# Patient Record
Sex: Female | Born: 1985 | Race: Black or African American | Hispanic: No | Marital: Single | Smoking: Former smoker
Health system: Southern US, Community
[De-identification: ages and names within clinical notes are randomized; demographics above are authoritative.]

## PROBLEM LIST (undated history)

## (undated) ENCOUNTER — Inpatient Hospital Stay (HOSPITAL_COMMUNITY): Payer: Self-pay

## (undated) DIAGNOSIS — Z789 Other specified health status: Secondary | ICD-10-CM

## (undated) DIAGNOSIS — Z720 Tobacco use: Secondary | ICD-10-CM

## (undated) DIAGNOSIS — R42 Dizziness and giddiness: Secondary | ICD-10-CM

## (undated) DIAGNOSIS — M8700 Idiopathic aseptic necrosis of unspecified bone: Secondary | ICD-10-CM

## (undated) DIAGNOSIS — G43909 Migraine, unspecified, not intractable, without status migrainosus: Secondary | ICD-10-CM

## (undated) HISTORY — DX: Migraine, unspecified, not intractable, without status migrainosus: G43.909

## (undated) HISTORY — DX: Dizziness and giddiness: R42

## (undated) HISTORY — DX: Tobacco use: Z72.0

## (undated) HISTORY — DX: Idiopathic aseptic necrosis of unspecified bone: M87.00

---
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
## 2003-06-05 ENCOUNTER — Emergency Department (HOSPITAL_COMMUNITY): Admission: EM | Admit: 2003-06-05 | Discharge: 2003-06-05 | Payer: Self-pay | Admitting: Emergency Medicine

## 2011-05-12 ENCOUNTER — Encounter (HOSPITAL_COMMUNITY): Payer: Self-pay | Admitting: *Deleted

## 2011-05-12 ENCOUNTER — Inpatient Hospital Stay (HOSPITAL_COMMUNITY)
Admission: AD | Admit: 2011-05-12 | Discharge: 2011-05-12 | Disposition: A | Discharge status: Home / Self Care | Payer: Medicaid Other | Source: Ambulatory Visit | Attending: Family Medicine | Admitting: Family Medicine

## 2011-05-12 DIAGNOSIS — Z3201 Encounter for pregnancy test, result positive: Secondary | ICD-10-CM | POA: Insufficient documentation

## 2011-05-12 HISTORY — DX: Other specified health status: Z78.9

## 2011-05-12 LAB — POCT PREGNANCY, URINE: Preg Test, Ur: POSITIVE — AB

## 2011-05-12 NOTE — MAU Note (Signed)
Hope Neese RNP in triage with patient, and gave proof of pregnancy letter.

## 2011-05-12 NOTE — MAU Note (Signed)
Pt states, " I need a proof of pregnancy letter so I can start care and get medicaid,"

## 2011-05-12 NOTE — MAU Provider Note (Signed)
Kathleen Powell is a 26 y.o. female @ 10.[redacted] weeks gestation by LMP. She is here for pregnancy verification letter so she can get her insurance in place and start prenatal care. She denies any problems at this time. Results for orders placed during the hospital encounter of 05/12/11 (from the past 24 hour(s))  POCT PREGNANCY, URINE     Status: Abnormal   Collection Time   05/12/11  4:10 PM      Component Value Range   Preg Test, Ur POSITIVE (*) NEGATIVE    Assessment: Positive Pregnancy Test  Plan:  Pregnancy verification letter givne   Patient to start prenatal care

## 2011-05-12 NOTE — Discharge Instructions (Signed)
Prenatal Care  WHAT IS PRENATAL CARE?  Prenatal care means health care during your pregnancy, before your baby is born. Take care of yourself and your baby by:   Getting early prenatal care. If you know you are pregnant, or think you might be pregnant, call your caregiver as soon as possible. Schedule a visit for a general/prenatal examination.   Getting regular prenatal care. Follow your caregiver's schedule for blood and other necessary tests. Do not miss appointments.   Do everything you can to keep yourself and your baby healthy during your pregnancy.   Prenatal care should include evaluation of medical, dietary, educational, psychological, and social needs for the couple and the medical, surgical, and genetic history of the family of the mother and father.   Discuss with your caregiver:   Your medicines, prescription, over-the-counter, and herbal medicines.   Substance abuse, alcohol, smoking, and illegal drugs.   Domestic abuse and violence, if present.   Your immunizations.   Nutrition and diet.   Exercising.   Environment and occupational hazards, at home and at work.   History of sexually transmitted disease, both you and your partner.   Previous pregnancies.  WHY IS PRENATAL CARE SO IMPORTANT?  By seeing you regularly, your caregiver has the chance to find problems early, so that they can be treated as soon as possible. Other problems might be prevented. Many studies have shown that early and regular prenatal care is important for the health of both mothers and their babies.  I AM THINKING ABOUT GETTING PREGNANT. HOW CAN I TAKE CARE OF MYSELF?  Taking care of yourself before you get pregnant helps you to have a healthy pregnancy. It also lowers your chances of having a baby born with a birth defect. Here are ways to take care of yourself before you get pregnant:   Eat healthy foods, exercise regularly (30 minutes per day for most days of the week is best), and get enough  rest and sleep. Talk to your caregiver about what kinds of foods and exercises are best for you.   Take 400 micrograms (mcg) of folic acid (one of the B vitamins) every day. The best way to do this is to take a daily multivitamin pill that contains this amount of folic acid. Getting enough of the synthetic (manufactured) form of folic acid every day before you get pregnant and during early pregnancy can help prevent certain birth defects. Many breakfast cereals and other grain products have folic acid added to them, but only certain cereals contain 400 mcg of folic acid per serving. Check the label on your multivitamin or cereal to find the amount of folic acid in the food.   See your caregiver for a complete check up before getting pregnant. Make sure that you have had all your immunization shots, especially for rubella (German measles). Rubella can cause serious birth defects. Chickenpox is another illness you want to avoid during pregnancy. If you have had chickenpox and rubella in the past, you should be immune to them.   Tell your caregiver about any prescription or non-prescription medicines (including herbal remedies) you are taking. Some medicines are not safe to take during pregnancy.   Stop smoking cigarettes, drinking alcohol, or taking illegal drugs. Ask your caregiver for help, if you need it. You can also get help with alcohol and drugs by talking with a member of your faith community, a counselor, or a trusted friend.   Discuss and treat any medical, social, or psychological   problems before getting pregnant.   Discuss any history of genetic problems in the mother, father, and their families. Do genetic testing before getting pregnant, when possible.   Discuss any physical or emotional abuse with your caregiver.   Discuss with your caregiver if you might be exposed to harmful chemicals on your job or where you live.   Discuss with your caregiver if you think your job or the hours you  work may be harmful and should be changed.   The father should be involved with the decision making and with all aspects of the pregnancy, labor, and delivery.   If you have medical insurance, make sure you are covered for pregnancy.  I JUST FOUND OUT THAT I AM PREGNANT. HOW CAN I TAKE CARE OF MYSELF?  Here are ways to take care of yourself and the precious new life growing inside you:   Continue taking your multivitamin with 400 micrograms (mcg) of folic acid every day.   Get early and regular prenatal care. It does not matter if this is your first pregnancy or if you already have children. It is very important to see a caregiver during your pregnancy. Your caregiver will check at each visit to make sure that you and the baby are healthy. If there are any problems, action can be taken right away to help you and the baby.   Eat a healthy diet that includes:   Fruits.   Vegetables.   Foods low in saturated fat.   Grains.   Calcium-rich foods.   Drink 6 to 8 glasses of liquids a day.   Unless your caregiver tells you not to, try to be physically active for 30 minutes, most days of the week. If you are pressed for time, you can get your activity in through 10 minute segments, three times a day.   If you smoke, drink alcohol, or use drugs, STOP. These can cause long-term damage to your baby. Talk with your caregiver about steps to take to stop smoking. Talk with a member of your faith community, a counselor, a trusted friend, or your caregiver if you are concerned about your alcohol or drug use.   Ask your caregiver before taking any medicine, even over-the-counter medicines. Some medicines are not safe to take during pregnancy.   Get plenty of rest and sleep.   Avoid hot tubs and saunas during pregnancy.   Do not have X-rays taken, unless absolutely necessary and with the recommendation of your caregiver. A lead shield can be placed on your abdomen, to protect the baby when X-rays  are taken in other parts of the body.   Do not empty the cat litter when you are pregnant. It may contain a parasite that causes an infection called toxoplasmosis, which can cause birth defects. Also, use gloves when working in garden areas used by cats.   Do not eat uncooked or undercooked cheese, meats, or fish.   Stay away from toxic chemicals like:   Insecticides.   Solvents (some cleaners or paint thinners).   Lead.   Mercury.   Sexual relations may continue until the end of the pregnancy, unless you have a medical problem or there is a problem with the pregnancy and your caregiver tells you not to.   Do not wear high heel shoes, especially during the second half of the pregnancy. You can lose your balance and fall.   Do not take long trips, unless absolutely necessary. Be sure to see your caregiver before   going on the trip.   Do not sit in one position for more than 2 hours, when on a trip.   Take a copy of your medical records when going on a trip.   Know where there is a hospital in the city you are visiting, in case of an emergency.   Most dangerous household products will have pregnancy warnings on their labels. Ask your caregiver about products if you are unsure.   Limit or eliminate your caffeine intake from coffee, tea, sodas, medicines, and chocolate.   Many women continue working through pregnancy. Staying active might help you stay healthier. If you have a question about the safety or the hours you work at your particular job, talk with your caregiver.   Get informed:   Read books.   Watch videos.   Go to childbirth classes for you and the father.   Talk with experienced moms.   Ask your caregiver about childbirth education classes for you and your partner. Classes can help you and your partner prepare for the birth of your baby.   Ask about a pediatrician (baby doctor) and methods and pain medicine for labor, delivery, and possible Cesarean delivery  (C-section).  I AM NOT THINKING ABOUT GETTING PREGNANT RIGHT NOW, BUT HEARD THAT ALL WOMEN SHOULD TAKE FOLIC ACID EVERY DAY?  All women of childbearing age, with even a remote chance of getting pregnant, should try to make sure they get enough folic acid. Many pregnancies are not planned. Many women do not know they are actually pregnant early in their pregnancies, and certain birth defects happen in the very early part of pregnancy. Taking 400 micrograms (mcg) of folic acid every day will help prevent certain birth defects that happen in the early part of pregnancy. If a woman begins taking vitamin pills in the second or third month of pregnancy, it may be too late to prevent birth defects. Folic acid may also have other health benefits for women, besides preventing birth defects.  HOW OFTEN SHOULD I SEE MY CAREGIVER DURING PREGNANCY?  Your caregiver will give you a schedule for your prenatal visits. You will have visits more often as you get closer to the end of your pregnancy. An average pregnancy lasts about 40 weeks.  A typical schedule includes visiting your caregiver:   About once each month, during your first 6 months of pregnancy.   Every 2 weeks, during the next 2 months.   Weekly in the last month, until the delivery date.  Your caregiver will probably want to see you more often if:  You are over 35.   Your pregnancy is high risk, because you have certain health problems or problems with the pregnancy, such as:   Diabetes.   High blood pressure.   The baby is not growing on schedule, according to the dates of the pregnancy.  Your caregiver will do special tests, to make sure you and the baby are not having any serious problems. WHAT HAPPENS DURING PRENATAL VISITS?   At your first prenatal visit, your caregiver will talk to you about you and your partner's health history and your family's health history, and will do a physical exam.   On your first visit, a physical exam will  include checks of your blood pressure, height and weight, and an exam of your pelvic organs. Your caregiver will do a Pap test if you have not had one recently, and will do cultures of your cervix to make sure there is no   infection.   At each visit, there will be tests of your blood, urine, blood pressure, weight, and checking the progress of the baby.   Your caregiver will be able to tell you when to expect that your baby will be born.   Each visit is also a chance for you to learn about staying healthy during pregnancy and for asking questions.   Discuss whether you will be breastfeeding.   At your later prenatal visits, your caregiver will check how you are doing and how the baby is developing. You may have a number of tests done as your pregnancy progresses.   Ultrasound exams are often used to check on the baby's growth and health.   You may have more urine and blood tests, as well as special tests, if needed. These may include amniocentesis (examine fluid in the pregnancy sac), stress tests (check how baby responds to contractions), biophysical profile (measures fetus well-being). Your caregiver will explain the tests and why they are necessary.  I AM IN MY LATE THIRTIES, AND I WANT TO HAVE A CHILD NOW. SHOULD I DO ANYTHING SPECIAL?  As you get older, there is more chance of having a medical problem (high blood pressure), pregnancy problem (preeclampsia, problems with the placenta), miscarriage, or a baby born with a birth defect. However, most women in their late thirties and early forties have healthy babies. See your caregiver on a regular basis before you get pregnant and be sure to go for exams throughout your pregnancy. Your caregiver probably will want to do some special tests to check on you and your baby's health when you are pregnant.  Women today are often delaying having children until later in life, when they are in their thirties and forties. While many women in their thirties  and forties have no difficulty getting pregnant, fertility does decline with age. For women over 40 who cannot get pregnant after 6 months of trying, it is recommended that they see their caregiver for a fertility evaluation. It is not uncommon to have trouble becoming pregnant or experience infertility (inability to become pregnant after trying for one year). If you think that you or your partner may be infertile, you can discuss this with your caregiver. He or she can recommend treatments such as drugs, surgery, or assisted reproductive technology.  Document Released: 02/04/2003 Document Revised: 01/21/2011 Document Reviewed: 01/01/2009 Exit[employer] Patient Information 2012 [employer]. 

## 2011-05-13 NOTE — MAU Provider Note (Signed)
Chart reviewed and agree with management and plan.  

## 2011-06-12 ENCOUNTER — Encounter (HOSPITAL_COMMUNITY): Payer: Self-pay | Admitting: *Deleted

## 2011-06-12 ENCOUNTER — Inpatient Hospital Stay (HOSPITAL_COMMUNITY): Payer: Medicaid Other

## 2011-06-12 ENCOUNTER — Inpatient Hospital Stay (HOSPITAL_COMMUNITY)
Admission: AD | Admit: 2011-06-12 | Discharge: 2011-06-12 | Disposition: A | Discharge status: Home / Self Care | Payer: Medicaid Other | Source: Ambulatory Visit | Attending: Family Medicine | Admitting: Family Medicine

## 2011-06-12 DIAGNOSIS — O4690 Antepartum hemorrhage, unspecified, unspecified trimester: Secondary | ICD-10-CM

## 2011-06-12 DIAGNOSIS — O4692 Antepartum hemorrhage, unspecified, second trimester: Secondary | ICD-10-CM | POA: Diagnosis present

## 2011-06-12 DIAGNOSIS — O3660X0 Maternal care for excessive fetal growth, unspecified trimester, not applicable or unspecified: Secondary | ICD-10-CM

## 2011-06-12 DIAGNOSIS — R319 Hematuria, unspecified: Secondary | ICD-10-CM | POA: Insufficient documentation

## 2011-06-12 DIAGNOSIS — O99891 Other specified diseases and conditions complicating pregnancy: Secondary | ICD-10-CM | POA: Insufficient documentation

## 2011-06-12 DIAGNOSIS — N760 Acute vaginitis: Secondary | ICD-10-CM

## 2011-06-12 LAB — URINALYSIS, ROUTINE W REFLEX MICROSCOPIC
Glucose, UA: NEGATIVE mg/dL
Ketones, ur: NEGATIVE mg/dL
Leukocytes, UA: NEGATIVE
Protein, ur: NEGATIVE mg/dL

## 2011-06-12 LAB — WET PREP, GENITAL
Trich, Wet Prep: NONE SEEN
Yeast Wet Prep HPF POC: NONE SEEN

## 2011-06-12 LAB — URINE MICROSCOPIC-ADD ON

## 2011-06-12 MED ORDER — METRONIDAZOLE 500 MG PO TABS: 500.0000 mg | ORAL_TABLET | Freq: Two times a day (BID) | ORAL | Status: AC

## 2011-06-12 NOTE — Progress Notes (Signed)
Kathleen Powell CNM in with pt

## 2011-06-12 NOTE — MAU Note (Signed)
Went to [facility] at work about 1545 and noticed few drops blood in urine.  Did not see any vaginal bleeding when wiped and is pretty sure blood was in urine. Denies any pain

## 2011-06-12 NOTE — MAU Provider Note (Signed)
  History     CSN: [id number]  Arrival date and time: 06/12/11 4696   First Provider Initiated Contact with Patient 06/12/11 1946      Chief Complaint  Patient presents with  . Hematuria   HPI Kathleen Powell is 26 y.o. G1P0 Unknown weeks presenting with report of seeing "a couple of drops of blood when I went to the bathroom (urinating)".  Denies dysuria. Urinating small amount.  Last intercourse 3 weeks ago.  Denies abdominal pain.  No concern for STIs.  Has appointment with OB 5/1 with MD on [facility].  Fetal heart tones by doppler 160.      Past Medical History  Diagnosis Date  . No pertinent past medical history     Past Surgical History  Procedure Date  . No past surgeries     Family History  Problem Relation Age of Onset  . Anesthesia problems Neg Hx   . Heart disease Maternal Grandmother     History  Substance Use Topics  . Smoking status: Current Everyday Smoker -- 0.2 packs/day  . Smokeless tobacco: Never Used  . Alcohol Use: No    Allergies: No Known Allergies  Prescriptions prior to admission  Medication Sig Dispense Refill  . acetaminophen (TYLENOL) 325 MG tablet Take 650 mg by mouth every 6 (six) hours as needed. For pain      . Prenatal Vit-Fe Fumarate-FA (PRENATAL MULTIVITAMIN) TABS Take 1 tablet by mouth daily.        ROS Physical Exam   Blood pressure 129/61, pulse 115, temperature 98.7 F (37.1 C), temperature source Oral, resp. rate 20, height 5\' 4"  (1.626 m), weight 51.71 kg (114 lb), last menstrual period 02/27/2011.  Physical Exam  MAU Course  Procedures  MDM  20:00 Care turned over to Ivonne Andrew, CNM Assessment and Plan    Kendrah Lovern,EVE M 06/12/2011, 7:46 PM

## 2011-06-12 NOTE — Progress Notes (Signed)
Written and verbal d/c instructions given and understanding voiced. 

## 2011-06-12 NOTE — Discharge Instructions (Signed)
Bacterial Vaginosis Bacterial vaginosis (BV) is a vaginal infection where the normal balance of bacteria in the vagina is disrupted. The normal balance is then replaced by an overgrowth of certain bacteria. There are several different kinds of bacteria that can cause BV. BV is the most common vaginal infection in women of childbearing age. CAUSES   The cause of BV is not fully understood. BV develops when there is an increase or imbalance of harmful bacteria.   Some activities or behaviors can upset the normal balance of bacteria in the vagina and put women at increased risk including:   Having a new sex partner or multiple sex partners.   Douching.   Using an intrauterine device (IUD) for contraception.   It is not clear what role sexual activity plays in the development of BV. However, women that have never had sexual intercourse are rarely infected with BV.  Women do not get BV from toilet seats, bedding, swimming pools or from touching objects around them.  SYMPTOMS   Grey vaginal discharge.   A fish-like odor with discharge, especially after sexual intercourse.   Itching or burning of the vagina and vulva.   Burning or pain with urination.   Some women have no signs or symptoms at all.  DIAGNOSIS  Your caregiver must examine the vagina for signs of BV. Your caregiver will perform lab tests and look at the sample of vaginal fluid through a microscope. They will look for bacteria and abnormal cells (clue cells), a pH test higher than 4.5, and a positive amine test all associated with BV.  RISKS AND COMPLICATIONS   Pelvic inflammatory disease (PID).   Infections following gynecology surgery.   Developing HIV.   Developing herpes virus.  TREATMENT  Sometimes BV will clear up without treatment. However, all women with symptoms of BV should be treated to avoid complications, especially if gynecology surgery is planned. Female partners generally do not need to be treated. However,  BV may spread between female sex partners so treatment is helpful in preventing a recurrence of BV.   BV may be treated with antibiotics. The antibiotics come in either pill or vaginal cream forms. Either can be used with nonpregnant or pregnant women, but the recommended dosages differ. These antibiotics are not harmful to the baby.   BV can recur after treatment. If this happens, a second round of antibiotics will often be prescribed.   Treatment is important for pregnant women. If not treated, BV can cause a premature delivery, especially for a pregnant woman who had a premature birth in the past. All pregnant women who have symptoms of BV should be checked and treated.   For chronic reoccurrence of BV, treatment with a type of prescribed gel vaginally twice a week is helpful.  HOME CARE INSTRUCTIONS   Finish all medication as directed by your caregiver.   Do not have sex until treatment is completed.   Tell your sexual partner that you have a vaginal infection. They should see their caregiver and be treated if they have problems, such as a mild rash or itching.   Practice safe sex. Use condoms. Only have 1 sex partner.  PREVENTION  Basic prevention steps can help reduce the risk of upsetting the natural balance of bacteria in the vagina and developing BV:  Do not have sexual intercourse (be abstinent).   Do not douche.   Use all of the medicine prescribed for treatment of BV, even if the signs and symptoms go away.     Tell your sex partner if you have BV. That way, they can be treated, if needed, to prevent reoccurrence.  SEEK MEDICAL CARE IF:   Your symptoms are not improving after 3 days of treatment.   You have increased discharge, pain, or fever.  MAKE SURE YOU:   Understand these instructions.   Will watch your condition.   Will get help right away if you are not doing well or get worse.  FOR MORE INFORMATION  Division of STD Prevention (DSTDP), Centers for Disease  Control and Prevention: www.cdc.gov/std American Social Health Association (ASHA): www.ashastd.org  Document Released: 02/01/2005 Document Revised: 01/21/2011 Document Reviewed: 07/25/2008 ExitCare Patient Information 2012 [employer]. 

## 2011-06-13 NOTE — MAU Provider Note (Signed)
Chart reviewed and agree with management and plan.  

## 2011-06-14 LAB — GC/CHLAMYDIA PROBE AMP, GENITAL: GC Probe Amp, Genital: NEGATIVE

## 2011-06-16 LAB — OB RESULTS CONSOLE HEPATITIS B SURFACE ANTIGEN: Hepatitis B Surface Ag: NEGATIVE

## 2011-06-16 LAB — OB RESULTS CONSOLE HIV ANTIBODY (ROUTINE TESTING): HIV: NONREACTIVE

## 2011-11-10 ENCOUNTER — Telehealth (HOSPITAL_COMMUNITY): Payer: Self-pay | Admitting: *Deleted

## 2011-11-10 ENCOUNTER — Encounter (HOSPITAL_COMMUNITY): Payer: Self-pay | Admitting: *Deleted

## 2011-11-10 NOTE — Telephone Encounter (Signed)
Preadmission screen  

## 2011-11-25 ENCOUNTER — Other Ambulatory Visit: Payer: Self-pay | Admitting: Obstetrics & Gynecology

## 2011-11-25 ENCOUNTER — Telehealth (HOSPITAL_COMMUNITY): Payer: Self-pay | Admitting: *Deleted

## 2011-11-25 DIAGNOSIS — O480 Post-term pregnancy: Secondary | ICD-10-CM

## 2011-11-25 NOTE — Telephone Encounter (Signed)
Preadmission screen  

## 2011-11-26 ENCOUNTER — Ambulatory Visit (HOSPITAL_COMMUNITY)
Admission: RE | Admit: 2011-11-26 | Discharge: 2011-11-26 | Disposition: A | Discharge status: Home / Self Care | Payer: Medicaid Other | Source: Ambulatory Visit | Attending: Obstetrics & Gynecology | Admitting: Obstetrics & Gynecology

## 2011-11-26 DIAGNOSIS — O480 Post-term pregnancy: Secondary | ICD-10-CM | POA: Insufficient documentation

## 2011-11-26 DIAGNOSIS — Z3689 Encounter for other specified antenatal screening: Secondary | ICD-10-CM | POA: Insufficient documentation

## 2011-11-27 ENCOUNTER — Inpatient Hospital Stay (HOSPITAL_COMMUNITY): Payer: Medicaid Other

## 2011-11-27 ENCOUNTER — Inpatient Hospital Stay (HOSPITAL_COMMUNITY)
Admission: AD | Admit: 2011-11-27 | Discharge: 2011-11-29 | DRG: 766 | Disposition: A | Discharge status: Home / Self Care | Payer: Medicaid Other | Source: Ambulatory Visit | Attending: Obstetrics & Gynecology | Admitting: Obstetrics & Gynecology

## 2011-11-27 ENCOUNTER — Encounter (HOSPITAL_COMMUNITY): Payer: Self-pay | Admitting: Anesthesiology

## 2011-11-27 ENCOUNTER — Inpatient Hospital Stay (HOSPITAL_COMMUNITY): Payer: Medicaid Other | Admitting: Anesthesiology

## 2011-11-27 ENCOUNTER — Encounter (HOSPITAL_COMMUNITY): Payer: Self-pay | Admitting: *Deleted

## 2011-11-27 ENCOUNTER — Encounter (HOSPITAL_COMMUNITY)
Admission: AD | Disposition: A | Discharge status: Home / Self Care | Payer: Self-pay | Source: Ambulatory Visit | Attending: Obstetrics & Gynecology

## 2011-11-27 DIAGNOSIS — Z98891 History of uterine scar from previous surgery: Secondary | ICD-10-CM

## 2011-11-27 LAB — CBC
HCT: 34.2 % — ABNORMAL LOW (ref 36.0–46.0)
MCV: 91.7 fL (ref 78.0–100.0)
Platelets: 345 10*3/uL (ref 150–400)
RBC: 3.73 MIL/uL — ABNORMAL LOW (ref 3.87–5.11)
RDW: 12.9 % (ref 11.5–15.5)
WBC: 11.2 10*3/uL — ABNORMAL HIGH (ref 4.0–10.5)

## 2011-11-27 SURGERY — Surgical Case
Anesthesia: Epidural | Site: Abdomen | Wound class: Clean Contaminated

## 2011-11-27 MED ORDER — EPHEDRINE 5 MG/ML INJ
10.0000 mg | INTRAVENOUS | Status: DC | PRN
  Filled 2011-11-27: qty 4

## 2011-11-27 MED ORDER — PHENYLEPHRINE 40 MCG/ML (10ML) SYRINGE FOR IV PUSH (FOR BLOOD PRESSURE SUPPORT): 80.0000 ug | PREFILLED_SYRINGE | INTRAVENOUS | Status: DC | PRN

## 2011-11-27 MED ORDER — KETOROLAC TROMETHAMINE 60 MG/2ML IM SOLN
INTRAMUSCULAR | Status: AC
  Administered 2011-11-27: 60 mg via INTRAMUSCULAR
  Filled 2011-11-27: qty 2

## 2011-11-27 MED ORDER — TETANUS-DIPHTH-ACELL PERTUSSIS 5-2.5-18.5 LF-MCG/0.5 IM SUSP
0.5000 mL | Freq: Once | INTRAMUSCULAR | Status: AC
  Administered 2011-11-29: 0.5 mL via INTRAMUSCULAR
  Filled 2011-11-27: qty 0.5

## 2011-11-27 MED ORDER — ONDANSETRON HCL 4 MG/2ML IJ SOLN: 4.0000 mg | INTRAMUSCULAR | Status: DC | PRN

## 2011-11-27 MED ORDER — DIPHENHYDRAMINE HCL 50 MG/ML IJ SOLN: 25.0000 mg | INTRAMUSCULAR | Status: DC | PRN

## 2011-11-27 MED ORDER — ONDANSETRON HCL 4 MG/2ML IJ SOLN
INTRAMUSCULAR | Status: DC | PRN
  Administered 2011-11-27: 4 mg via INTRAVENOUS

## 2011-11-27 MED ORDER — OXYTOCIN 40 UNITS IN LACTATED RINGERS INFUSION - SIMPLE MED: 62.5000 mL/h | INTRAVENOUS | Status: AC

## 2011-11-27 MED ORDER — ZOLPIDEM TARTRATE 5 MG PO TABS: 5.0000 mg | ORAL_TABLET | Freq: Every evening | ORAL | Status: DC | PRN

## 2011-11-27 MED ORDER — DIPHENHYDRAMINE HCL 50 MG/ML IJ SOLN: 12.5000 mg | INTRAMUSCULAR | Status: DC | PRN

## 2011-11-27 MED ORDER — SIMETHICONE 80 MG PO CHEW: 80.0000 mg | CHEWABLE_TABLET | ORAL | Status: DC | PRN

## 2011-11-27 MED ORDER — SODIUM CHLORIDE 0.9 % IJ SOLN: 3.0000 mL | INTRAMUSCULAR | Status: DC | PRN

## 2011-11-27 MED ORDER — OXYTOCIN 10 UNIT/ML IJ SOLN
40.0000 [IU] | INTRAVENOUS | Status: DC | PRN
  Administered 2011-11-27: 40 [IU] via INTRAVENOUS

## 2011-11-27 MED ORDER — SODIUM BICARBONATE 8.4 % IV SOLN
INTRAVENOUS | Status: AC
  Filled 2011-11-27: qty 50

## 2011-11-27 MED ORDER — SODIUM BICARBONATE 8.4 % IV SOLN
INTRAVENOUS | Status: DC | PRN
  Administered 2011-11-27: 5 mL via EPIDURAL

## 2011-11-27 MED ORDER — OXYTOCIN BOLUS FROM INFUSION
500.0000 mL | Freq: Once | INTRAVENOUS | Status: DC
  Filled 2011-11-27: qty 500

## 2011-11-27 MED ORDER — PRENATAL MULTIVITAMIN CH
1.0000 | ORAL_TABLET | Freq: Every day | ORAL | Status: DC
  Administered 2011-11-28 – 2011-11-29 (×2): 1 via ORAL
  Filled 2011-11-27 (×2): qty 1

## 2011-11-27 MED ORDER — WITCH HAZEL-GLYCERIN EX PADS: 1.0000 "application " | MEDICATED_PAD | CUTANEOUS | Status: DC | PRN

## 2011-11-27 MED ORDER — ONDANSETRON HCL 4 MG/2ML IJ SOLN
INTRAMUSCULAR | Status: AC
  Filled 2011-11-27: qty 2

## 2011-11-27 MED ORDER — ONDANSETRON HCL 4 MG PO TABS: 4.0000 mg | ORAL_TABLET | ORAL | Status: DC | PRN

## 2011-11-27 MED ORDER — IBUPROFEN 600 MG PO TABS: 600.0000 mg | ORAL_TABLET | Freq: Four times a day (QID) | ORAL | Status: DC | PRN

## 2011-11-27 MED ORDER — LACTATED RINGERS IV SOLN: 500.0000 mL | Freq: Once | INTRAVENOUS | Status: DC

## 2011-11-27 MED ORDER — LACTATED RINGERS IV SOLN
INTRAVENOUS | Status: DC
  Administered 2011-11-27: 21:00:00 via INTRAVENOUS

## 2011-11-27 MED ORDER — SODIUM CHLORIDE 0.9 % IR SOLN
Status: DC | PRN
  Administered 2011-11-27: 1000 mL

## 2011-11-27 MED ORDER — LIDOCAINE-EPINEPHRINE (PF) 2 %-1:200000 IJ SOLN
INTRAMUSCULAR | Status: AC
  Filled 2011-11-27: qty 20

## 2011-11-27 MED ORDER — SCOPOLAMINE 1 MG/3DAYS TD PT72
1.0000 | MEDICATED_PATCH | Freq: Once | TRANSDERMAL | Status: DC
  Administered 2011-11-27: 1.5 mg via TRANSDERMAL

## 2011-11-27 MED ORDER — LANOLIN HYDROUS EX OINT: 1.0000 "application " | TOPICAL_OINTMENT | CUTANEOUS | Status: DC | PRN

## 2011-11-27 MED ORDER — LACTATED RINGERS IV SOLN: INTRAVENOUS | Status: DC

## 2011-11-27 MED ORDER — OXYTOCIN 10 UNIT/ML IJ SOLN
INTRAMUSCULAR | Status: AC
  Filled 2011-11-27: qty 4

## 2011-11-27 MED ORDER — MORPHINE SULFATE (PF) 0.5 MG/ML IJ SOLN
INTRAMUSCULAR | Status: DC | PRN
  Administered 2011-11-27: 1 mg via INTRAVENOUS

## 2011-11-27 MED ORDER — BUTORPHANOL TARTRATE 1 MG/ML IJ SOLN: 1.0000 mg | INTRAMUSCULAR | Status: DC | PRN

## 2011-11-27 MED ORDER — LIDOCAINE HCL (PF) 1 % IJ SOLN
INTRAMUSCULAR | Status: DC | PRN
  Administered 2011-11-27 (×3): 4 mL

## 2011-11-27 MED ORDER — TERBUTALINE SULFATE 1 MG/ML IJ SOLN
0.2500 mg | Freq: Once | INTRAMUSCULAR | Status: AC | PRN
  Administered 2011-11-27: 0.25 mg via SUBCUTANEOUS
  Filled 2011-11-27: qty 1

## 2011-11-27 MED ORDER — EPHEDRINE 5 MG/ML INJ: 10.0000 mg | INTRAVENOUS | Status: DC | PRN

## 2011-11-27 MED ORDER — MORPHINE SULFATE 0.5 MG/ML IJ SOLN
INTRAMUSCULAR | Status: AC
  Filled 2011-11-27: qty 10

## 2011-11-27 MED ORDER — DIBUCAINE 1 % RE OINT: 1.0000 "application " | TOPICAL_OINTMENT | RECTAL | Status: DC | PRN

## 2011-11-27 MED ORDER — LACTATED RINGERS IV SOLN
500.0000 mL | INTRAVENOUS | Status: DC | PRN
  Administered 2011-11-27: 1000 mL via INTRAVENOUS

## 2011-11-27 MED ORDER — MEPERIDINE HCL 25 MG/ML IJ SOLN: 6.2500 mg | INTRAMUSCULAR | Status: DC | PRN

## 2011-11-27 MED ORDER — CITRIC ACID-SODIUM CITRATE 334-500 MG/5ML PO SOLN
30.0000 mL | ORAL | Status: DC | PRN
  Administered 2011-11-27: 30 mL via ORAL
  Filled 2011-11-27: qty 15

## 2011-11-27 MED ORDER — PHENYLEPHRINE 40 MCG/ML (10ML) SYRINGE FOR IV PUSH (FOR BLOOD PRESSURE SUPPORT)
80.0000 ug | PREFILLED_SYRINGE | INTRAVENOUS | Status: DC | PRN
  Filled 2011-11-27: qty 5

## 2011-11-27 MED ORDER — KETOROLAC TROMETHAMINE 30 MG/ML IJ SOLN: 30.0000 mg | Freq: Four times a day (QID) | INTRAMUSCULAR | Status: AC | PRN

## 2011-11-27 MED ORDER — ACETAMINOPHEN 325 MG PO TABS: 650.0000 mg | ORAL_TABLET | ORAL | Status: DC | PRN

## 2011-11-27 MED ORDER — OXYTOCIN 40 UNITS IN LACTATED RINGERS INFUSION - SIMPLE MED: 62.5000 mL/h | Freq: Once | INTRAVENOUS | Status: DC

## 2011-11-27 MED ORDER — FLEET ENEMA 7-19 GM/118ML RE ENEM: 1.0000 | ENEMA | RECTAL | Status: DC | PRN

## 2011-11-27 MED ORDER — OXYCODONE-ACETAMINOPHEN 5-325 MG PO TABS: 1.0000 | ORAL_TABLET | ORAL | Status: DC | PRN

## 2011-11-27 MED ORDER — NALBUPHINE HCL 10 MG/ML IJ SOLN
5.0000 mg | INTRAMUSCULAR | Status: DC | PRN
  Filled 2011-11-27: qty 1

## 2011-11-27 MED ORDER — CEFAZOLIN SODIUM-DEXTROSE 2-3 GM-% IV SOLR
2.0000 g | Freq: Once | INTRAVENOUS | Status: AC
  Administered 2011-11-27: 2 g via INTRAVENOUS
  Filled 2011-11-27: qty 50

## 2011-11-27 MED ORDER — SODIUM CHLORIDE 0.9 % IV SOLN: 1.0000 ug/kg/h | INTRAVENOUS | Status: DC | PRN

## 2011-11-27 MED ORDER — MISOPROSTOL 25 MCG QUARTER TABLET: 25.0000 ug | ORAL_TABLET | ORAL | Status: DC | PRN

## 2011-11-27 MED ORDER — ONDANSETRON HCL 4 MG/2ML IJ SOLN: 4.0000 mg | Freq: Three times a day (TID) | INTRAMUSCULAR | Status: DC | PRN

## 2011-11-27 MED ORDER — KETOROLAC TROMETHAMINE 60 MG/2ML IM SOLN
60.0000 mg | Freq: Once | INTRAMUSCULAR | Status: AC | PRN
  Administered 2011-11-27: 60 mg via INTRAMUSCULAR

## 2011-11-27 MED ORDER — IBUPROFEN 600 MG PO TABS
600.0000 mg | ORAL_TABLET | Freq: Four times a day (QID) | ORAL | Status: DC | PRN
  Filled 2011-11-27 (×5): qty 1

## 2011-11-27 MED ORDER — METOCLOPRAMIDE HCL 5 MG/ML IJ SOLN: 10.0000 mg | Freq: Three times a day (TID) | INTRAMUSCULAR | Status: DC | PRN

## 2011-11-27 MED ORDER — DIPHENHYDRAMINE HCL 25 MG PO CAPS: 25.0000 mg | ORAL_CAPSULE | Freq: Four times a day (QID) | ORAL | Status: DC | PRN

## 2011-11-27 MED ORDER — DIPHENHYDRAMINE HCL 25 MG PO CAPS: 25.0000 mg | ORAL_CAPSULE | ORAL | Status: DC | PRN

## 2011-11-27 MED ORDER — ONDANSETRON HCL 4 MG/2ML IJ SOLN: 4.0000 mg | Freq: Four times a day (QID) | INTRAMUSCULAR | Status: DC | PRN

## 2011-11-27 MED ORDER — LACTATED RINGERS IV SOLN
INTRAVENOUS | Status: DC
  Administered 2011-11-27: 12:00:00 via INTRAVENOUS

## 2011-11-27 MED ORDER — MENTHOL 3 MG MT LOZG: 1.0000 | LOZENGE | OROMUCOSAL | Status: DC | PRN

## 2011-11-27 MED ORDER — NALOXONE HCL 0.4 MG/ML IJ SOLN: 0.4000 mg | INTRAMUSCULAR | Status: DC | PRN

## 2011-11-27 MED ORDER — SIMETHICONE 80 MG PO CHEW
80.0000 mg | CHEWABLE_TABLET | Freq: Three times a day (TID) | ORAL | Status: DC
  Administered 2011-11-28 – 2011-11-29 (×2): 80 mg via ORAL

## 2011-11-27 MED ORDER — SENNOSIDES-DOCUSATE SODIUM 8.6-50 MG PO TABS
2.0000 | ORAL_TABLET | Freq: Every day | ORAL | Status: DC
  Administered 2011-11-28: 2 via ORAL

## 2011-11-27 MED ORDER — OXYCODONE-ACETAMINOPHEN 5-325 MG PO TABS
1.0000 | ORAL_TABLET | ORAL | Status: DC | PRN
  Administered 2011-11-28 – 2011-11-29 (×7): 1 via ORAL
  Filled 2011-11-27 (×7): qty 1

## 2011-11-27 MED ORDER — IBUPROFEN 600 MG PO TABS
600.0000 mg | ORAL_TABLET | Freq: Four times a day (QID) | ORAL | Status: DC
  Administered 2011-11-28 – 2011-11-29 (×6): 600 mg via ORAL
  Filled 2011-11-27: qty 1

## 2011-11-27 MED ORDER — SCOPOLAMINE 1 MG/3DAYS TD PT72
MEDICATED_PATCH | TRANSDERMAL | Status: AC
  Filled 2011-11-27: qty 1

## 2011-11-27 MED ORDER — LIDOCAINE HCL (PF) 1 % IJ SOLN: 30.0000 mL | INTRAMUSCULAR | Status: DC | PRN

## 2011-11-27 MED ORDER — FENTANYL CITRATE 0.05 MG/ML IJ SOLN: 25.0000 ug | INTRAMUSCULAR | Status: DC | PRN

## 2011-11-27 MED ORDER — LACTATED RINGERS IV SOLN
INTRAVENOUS | Status: DC | PRN
  Administered 2011-11-27 (×2): via INTRAVENOUS

## 2011-11-27 MED ORDER — FENTANYL 2.5 MCG/ML BUPIVACAINE 1/10 % EPIDURAL INFUSION (WH - ANES)
14.0000 mL/h | INTRAMUSCULAR | Status: DC
  Administered 2011-11-27: 14 mL/h via EPIDURAL
  Filled 2011-11-27: qty 125

## 2011-11-27 MED ORDER — MORPHINE SULFATE (PF) 0.5 MG/ML IJ SOLN
INTRAMUSCULAR | Status: DC | PRN
  Administered 2011-11-27: 4 mg via EPIDURAL

## 2011-11-27 SURGICAL SUPPLY — 33 items
CLOTH BEACON ORANGE TIMEOUT ST (SAFETY) ×2 IMPLANT
DERMABOND ADVANCED (GAUZE/BANDAGES/DRESSINGS)
DERMABOND ADVANCED .7 DNX12 (GAUZE/BANDAGES/DRESSINGS) IMPLANT
DRAPE SURG 17X23 STRL (DRAPES) ×2 IMPLANT
DRSG COVADERM 4X10 (GAUZE/BANDAGES/DRESSINGS) ×2 IMPLANT
DURAPREP 26ML APPLICATOR (WOUND CARE) ×2 IMPLANT
ELECT REM PT RETURN 9FT ADLT (ELECTROSURGICAL) ×2
ELECTRODE REM PT RTRN 9FT ADLT (ELECTROSURGICAL) ×1 IMPLANT
EXTRACTOR VACUUM M CUP 4 TUBE (SUCTIONS) IMPLANT
GLOVE BIO SURGEON STRL SZ8.5 (GLOVE) ×4 IMPLANT
GOWN PREVENTION PLUS LG XLONG (DISPOSABLE) ×2 IMPLANT
GOWN PREVENTION PLUS XXLARGE (GOWN DISPOSABLE) ×2 IMPLANT
KIT ABG SYR 3ML LUER SLIP (SYRINGE) IMPLANT
NEEDLE HYPO 25X5/8 SAFETYGLIDE (NEEDLE) IMPLANT
NS IRRIG 1000ML POUR BTL (IV SOLUTION) ×2 IMPLANT
PACK C SECTION WH (CUSTOM PROCEDURE TRAY) ×2 IMPLANT
PAD ABD 7.5X8 STRL (GAUZE/BANDAGES/DRESSINGS) ×2 IMPLANT
PAD OB MATERNITY 4.3X12.25 (PERSONAL CARE ITEMS) ×2 IMPLANT
SLEEVE SCD COMPRESS KNEE MED (MISCELLANEOUS) ×2 IMPLANT
STAPLER VISISTAT 35W (STAPLE) ×2 IMPLANT
SUT CHROMIC 0 CT 802H (SUTURE) ×2 IMPLANT
SUT CHROMIC 1 CTX 36 (SUTURE) ×4 IMPLANT
SUT CHROMIC 2 0 SH (SUTURE) ×2 IMPLANT
SUT GUT PLAIN 0 CT-3 TAN 27 (SUTURE) IMPLANT
SUT MON AB 4-0 PS1 27 (SUTURE) ×2 IMPLANT
SUT VIC AB 0 CT1 18XCR BRD8 (SUTURE) IMPLANT
SUT VIC AB 0 CT1 8-18 (SUTURE)
SUT VIC AB 0 CTX 36 (SUTURE) ×2
SUT VIC AB 0 CTX36XBRD ANBCTRL (SUTURE) ×2 IMPLANT
TAPE CLOTH SURG 4X10 WHT LF (GAUZE/BANDAGES/DRESSINGS) ×2 IMPLANT
TOWEL OR 17X24 6PK STRL BLUE (TOWEL DISPOSABLE) ×4 IMPLANT
TRAY FOLEY CATH 14FR (SET/KITS/TRAYS/PACK) IMPLANT
WATER STERILE IRR 1000ML POUR (IV SOLUTION) ×2 IMPLANT

## 2011-11-27 NOTE — Anesthesia Postprocedure Evaluation (Signed)
  Anesthesia Post-op Note  Patient: Kathleen Powell  Procedure(s) Performed: Procedure(s) (LRB) with comments: CESAREAN SECTION (N/A) - Primary cesarean section with delivery of baby boy at 31. Apgars 8/9.  Patient Location: PACU and Mother/Baby  Anesthesia Type: Epidural  Level of Consciousness: awake, alert  and oriented  Airway and Oxygen Therapy: Patient Spontanous Breathing  Post-op Pain: mild  Post-op Assessment: Post-op Vital signs reviewed  Post-op Vital Signs: Reviewed and stable  Complications: No apparent anesthesia complications

## 2011-11-27 NOTE — Anesthesia Postprocedure Evaluation (Signed)
Anesthesia Post Note  Patient: Kathleen Powell  Procedure(s) Performed: Procedure(s) (LRB): CESAREAN SECTION (N/A)  Anesthesia type: Epidural  Patient location: PACU  Post pain: Pain level controlled  Post assessment: Post-op Vital signs reviewed  Last Vitals:  Filed Vitals:   11/27/11 1745  BP: 128/79  Pulse: 85  Temp: 37.2 C  Resp: 20    Post vital signs: stable  Level of consciousness: awake  Complications: No apparent anesthesia complications

## 2011-11-27 NOTE — Progress Notes (Signed)
Dr Gaynell Face aware of pt's admission and status. Aware of ctx pattern, sve, hx colpo, FM strip. Will obs and reck in an hr. BPP if strip not reactive.

## 2011-11-27 NOTE — Progress Notes (Signed)
Pt turned R lateral. Trans and toco adj

## 2011-11-27 NOTE — Anesthesia Procedure Notes (Signed)
Epidural Patient location during procedure: OB Start time: 11/27/2011 10:27 AM  Staffing Performed by: anesthesiologist   Preanesthetic Checklist Completed: patient identified, site marked, surgical consent, pre-op evaluation, timeout performed, IV checked, risks and benefits discussed and monitors and equipment checked  Epidural Patient position: sitting Prep: site prepped and draped and DuraPrep Patient monitoring: continuous pulse ox and blood pressure Approach: midline Injection technique: LOR air  Needle:  Needle type: Tuohy  Needle gauge: 17 G Needle length: 9 cm and 9 Needle insertion depth: 5 cm cm Catheter type: closed end flexible Catheter size: 19 Gauge Catheter at skin depth: 10 cm Test dose: negative  Assessment Events: blood not aspirated, injection not painful, no injection resistance, negative IV test and no paresthesia  Additional Notes Discussed risk of headache, infection, bleeding, nerve injury and failed or incomplete block.  Patient voices understanding and wishes to proceed. Reason for block:procedure for pain

## 2011-11-27 NOTE — Progress Notes (Signed)
Up to BR and then to u/s via w/c

## 2011-11-27 NOTE — OR Nursing (Addendum)
Uterus massaged by S. Suprena Travaglini Charity fundraiser . Two tubes of cord blood sent to lab. Foley catheter in place upon arrival to OR. Urine color-concentrated.  20cc of blood evacuated from uterus during uterine massage.

## 2011-11-27 NOTE — H&P (Signed)
This is Dr. Francoise Ceo dictating the history and physical on Kathleen Powell she's a 26 year old gravida 1 EDC 11/23/2011 she's for 2 weeks and 4 days negative GBS in labor cervix 1 cm 90% vertex -1-2 amniotomy performed the fluid was meconium-stained she has a history of having had cryosurgery in the past Past medical history negative Past surgical history cryosurgery for abnormal Pap Social history negative Family history negative System review noncontributory Physical exam revealed a well-developed female in labor HEENT negative for Breasts negative Heart regular rhythm no murmurs no gallops Lungs clear to P&A Abdomen term Pelvic as described above Extremities negative

## 2011-11-27 NOTE — Transfer of Care (Signed)
Immediate Anesthesia Transfer of Care Note  Patient: Kathleen Powell  Procedure(s) Performed: Procedure(s) (LRB) with comments: CESAREAN SECTION (N/A) - Primary cesarean section with delivery of baby boy at 1536. Apgars 8/9.  Patient Location: PACU  Anesthesia Type: Epidural  Level of Consciousness: awake, alert  and oriented  Airway & Oxygen Therapy: Patient Spontanous Breathing  Post-op Assessment: Report given to PACU RN and Post -op Vital signs reviewed and stable  Post vital signs: Reviewed and stable  Complications: No apparent anesthesia complications

## 2011-11-27 NOTE — Anesthesia Preprocedure Evaluation (Signed)
Anesthesia Evaluation  Patient identified by MRN, date of birth, ID band Patient awake    Reviewed: Allergy & Precautions, H&P , NPO status , Patient's Chart, lab work & pertinent test results, reviewed documented beta blocker date and time   History of Anesthesia Complications Negative for: history of anesthetic complications  Airway Mallampati: II TM Distance: >3 FB Neck ROM: full    Dental  (+) Teeth Intact   Pulmonary former smoker,  breath sounds clear to auscultation        Cardiovascular negative cardio ROS  Rhythm:regular Rate:Normal     Neuro/Psych negative neurological ROS  negative psych ROS   GI/Hepatic negative GI ROS, Neg liver ROS,   Endo/Other  negative endocrine ROS  Renal/GU negative Renal ROS     Musculoskeletal   Abdominal   Peds  Hematology negative hematology ROS (+)   Anesthesia Other Findings Tongue piercing - asked to remove  Reproductive/Obstetrics (+) Pregnancy                           Anesthesia Physical Anesthesia Plan  ASA: II  Anesthesia Plan: Epidural   Post-op Pain Management:    Induction:   Airway Management Planned:   Additional Equipment:   Intra-op Plan:   Post-operative Plan:   Informed Consent: I have reviewed the patients History and Physical, chart, labs and discussed the procedure including the risks, benefits and alternatives for the proposed anesthesia with the patient or authorized representative who has indicated his/her understanding and acceptance.     Plan Discussed with:   Anesthesia Plan Comments:         Anesthesia Quick Evaluation

## 2011-11-27 NOTE — Addendum Note (Signed)
Addendum  created 11/27/11 1922 by Armanda Heritage, RN   Modules edited:Charges VN, Notes Section

## 2011-11-27 NOTE — MAU Note (Signed)
Been having ctxs since about 0230. No leakng or bleeding.

## 2011-11-27 NOTE — Anesthesia Postprocedure Evaluation (Signed)
  Anesthesia Post-op Note  Patient: Kathleen Powell  Procedure(s) Performed: Procedure(s) (LRB) with comments: CESAREAN SECTION (N/A) - Primary cesarean section with delivery of baby boy at 1536. Apgars 8/9.  Patient Location: PACU and Mother/Baby  Anesthesia Type: Epidural  Level of Consciousness: awake, alert  and oriented  Airway and Oxygen Therapy: Patient Spontanous Breathing  Post-op Pain: mild  Post-op Assessment: Post-op Vital signs reviewed  Post-op Vital Signs: Reviewed and stable  Complications: No apparent anesthesia complications 

## 2011-11-27 NOTE — Progress Notes (Signed)
Patient ID: Kathleen Powell, female   DOB: Feb 15, 1986, 26 y.o.   MRN: [id number] When amniotomy was performed the fluid was noted to be click to be meconium-stained IUPC inserted and she started having late decelerations amnioinfusion was performed oxygen administered in the late decelerations stopped they have no resumed and the persistent and she still 1 cm 90% at a -1 station she delivered by C-section because of nonreassuring fetal heart rate tracing

## 2011-11-27 NOTE — Op Note (Signed)
preop diagnosis meconium-stained fluid nonreassuring fetal heart rate tracing Postop diagnosis the same Surgeon Dr. Francoise Ceo Anesthesia epidural Procedure patient placed on the operating table in the supine position abdomen prepped and draped data entered with a Foley catheter a transverse suprapubic incision made carried down to the rectus fascia fascia cleaned and incised the length of the incision recti muscles retracted laterally peritoneum incised longitudinally a transverse incision made in the visceroperitoneum above the bladder and the bladder mobilized inferiorly transverse low uterine incision made patient delivered from the OP position off a female Apgar 89 the team was in attendance the placenta was fundal removed manually uterine cavity clean with dry laps the uterine incision closed in one layer with continuous within normal on chromic hemostasis was satisfactory bladder flap reattached to a chromic uterus was well contracted tubes and ovaries normal abdomen closed in layers peritoneum continuous with 2-0 chromic fascia continuous with of 0 Dexon and the skin closed with staples blood loss 600 cc patient tolerated the procedure well and

## 2011-11-28 LAB — CBC
MCHC: 33.3 g/dL (ref 30.0–36.0)
MCV: 91.5 fL (ref 78.0–100.0)
Platelets: 268 10*3/uL (ref 150–400)
RDW: 13 % (ref 11.5–15.5)
WBC: 14.5 10*3/uL — ABNORMAL HIGH (ref 4.0–10.5)

## 2011-11-28 NOTE — Progress Notes (Signed)
Patient ID: Kathleen Powell, female   DOB: Apr 20, 1985, 26 y.o.   MRN: [id number] Postop day 1 Vital signs normal Incision clean and dry Fundus firm Lochia moderate No complaints

## 2011-11-28 NOTE — Progress Notes (Signed)
Pt c/o tingling and swelling in Left hand after ambulating and IV catheter removal. Pt able to discern soft and lift touch. Slight swelling L>R noted but no change in skin color and termperature. Pulses intact +2. Pt advised to elevate hand and apply cold pack for 20 minutes. MD notified and told to put heat on it.

## 2011-11-29 ENCOUNTER — Encounter (HOSPITAL_COMMUNITY): Payer: Self-pay | Admitting: Obstetrics

## 2011-11-29 MED ORDER — IBUPROFEN 600 MG PO TABS: 600.0000 mg | ORAL_TABLET | Freq: Four times a day (QID) | ORAL | Status: DC | PRN

## 2011-11-29 MED ORDER — OXYCODONE-ACETAMINOPHEN 5-325 MG PO TABS: 1.0000 | ORAL_TABLET | ORAL | Status: DC | PRN

## 2011-11-29 MED ORDER — FUSION PLUS PO CAPS: 1.0000 | ORAL_CAPSULE | Freq: Every day | ORAL | Status: DC

## 2011-11-29 MED ORDER — PNEUMOCOCCAL VAC POLYVALENT 25 MCG/0.5ML IJ INJ: 0.5000 mL | INJECTION | Freq: Once | INTRAMUSCULAR | Status: DC

## 2011-11-29 MED ORDER — PNEUMOCOCCAL VAC POLYVALENT 25 MCG/0.5ML IJ INJ
0.5000 mL | INJECTION | Freq: Once | INTRAMUSCULAR | Status: AC
  Administered 2011-11-29: 0.5 mL via INTRAMUSCULAR
  Filled 2011-11-29: qty 0.5

## 2011-11-29 NOTE — Progress Notes (Signed)
Post discharge review completed. 

## 2011-11-29 NOTE — Discharge Summary (Signed)
Obstetric Discharge Summary Reason for Admission: onset of labor Prenatal Procedures: ultrasound Intrapartum Procedures: cesarean: low cervical, transverse Postpartum Procedures: none Complications-Operative and Postpartum: none Hemoglobin  Date Value Range Status  11/28/2011 9.0* 12.0 - 15.0 g/dL Final     DELTA CHECK NOTED     REPEATED TO VERIFY     HCT  Date Value Range Status  11/28/2011 27.0* 36.0 - 46.0 % Final    Physical Exam:  General: alert and no distress Lochia: appropriate Uterine Fundus: firm Incision: healing well DVT Evaluation: No evidence of DVT seen on physical exam.  Discharge Diagnoses: Term Pregnancy-delivered  Discharge Information: Date: 11/29/2011 Activity: pelvic rest Diet: routine Medications: PNV, Ibuprofen, Colace, Iron and Percocet Condition: stable Instructions: refer to practice specific booklet Discharge to: home Follow-up Information    Follow up with HARPER,CHARLES A, MD. Schedule an appointment as soon as possible for a visit in 2 weeks.   Contact information:   [address] [place] [address] [phone]          Newborn Data: Live born female  Birth Weight: 6 lb 3.3 oz (2815 g) APGAR: 8, 9  Home with mother.  HARPER,CHARLES A 11/29/2011, 10:21 AM

## 2011-11-29 NOTE — Progress Notes (Signed)
Subjective: Postpartum Day 2: Cesarean Delivery Patient reports no complaints.  Objective: Vital signs in last 24 hours: Temp:  [98 F (36.7 C)-98.9 F (37.2 C)] 98 F (36.7 C) (10/14 0419) Pulse Rate:  [71-84] 71  (10/14 0419) Resp:  [18] 18  (10/14 0419) BP: (100-115)/(56-69) 115/56 mmHg (10/14 0419) SpO2:  [97 %] 97 % (10/13 1425)  Physical Exam:  General: alert and no distress Lochia: appropriate Uterine Fundus: firm Incision: healing well DVT Evaluation: No evidence of DVT seen on physical exam.   Basename 11/28/11 0545 11/27/11 0855  HGB 9.0* 11.3*  HCT 27.0* 34.2*    Assessment/Plan: Status post Cesarean section. Doing well postoperatively.  Discharge home with standard precautions and return to clinic in 2 weeks.  Dion Sibal A 11/29/2011, 10:19 AM

## 2011-11-29 NOTE — Discharge Summary (Deleted)
Obstetric Discharge Summary Reason for Admission: onset of labor Prenatal Procedures: ultrasound Intrapartum Procedures: spontaneous vaginal delivery Postpartum Procedures: none Complications-Operative and Postpartum: none Hemoglobin  Date Value Range Status  11/28/2011 9.0* 12.0 - 15.0 g/dL Final     DELTA CHECK NOTED     REPEATED TO VERIFY     HCT  Date Value Range Status  11/28/2011 27.0* 36.0 - 46.0 % Final    Physical Exam:  General: alert and no distress Lochia: appropriate Uterine Fundus: firm Incision: none DVT Evaluation: No evidence of DVT seen on physical exam.  Discharge Diagnoses: Term Pregnancy-delivered  Discharge Information: Date: 11/29/2011 Activity: pelvic rest Diet: routine Medications: PNV, Ibuprofen, Colace, Iron and Percocet Condition: stable Instructions: refer to practice specific booklet Discharge to: home Follow-up Information    Follow up with HARPER,CHARLES A, MD. Schedule an appointment as soon as possible for a visit in 6 weeks.   Contact information:   [address] [place] [address] [phone]          Newborn Data: Live born female  Birth Weight: 6 lb 3.3 oz (2815 g) APGAR: 8, 9  Home with mother.  HARPER,CHARLES A 11/29/2011, 5:49 AM

## 2011-11-29 NOTE — Progress Notes (Deleted)
Post Partum Day 2 Subjective: no complaints  Objective: Blood pressure 115/56, pulse 71, temperature 98 F (36.7 C), temperature source Oral, resp. rate 18, height 5\' 3"  (1.6 m), weight 67.223 kg (148 lb 3.2 oz), last menstrual period 02/27/2011, SpO2 97.00%, unknown if currently breastfeeding.  Physical Exam:  General: alert and no distress Lochia: appropriate Uterine Fundus: firm Incision: none DVT Evaluation: No evidence of DVT seen on physical exam.   Basename 11/28/11 0545 11/27/11 0855  HGB 9.0* 11.3*  HCT 27.0* 34.2*    Assessment/Plan: Discharge home   LOS: 2 days   Jaydee Ingman A 11/29/2011, 5:42 AM

## 2011-11-30 ENCOUNTER — Inpatient Hospital Stay (HOSPITAL_COMMUNITY): Admission: RE | Admit: 2011-11-30 | Payer: Medicaid Other | Source: Ambulatory Visit

## 2012-06-29 ENCOUNTER — Ambulatory Visit: Payer: Self-pay

## 2012-06-30 ENCOUNTER — Ambulatory Visit (INDEPENDENT_AMBULATORY_CARE_PROVIDER_SITE_OTHER): Payer: BC Managed Care – PPO | Admitting: *Deleted

## 2012-06-30 VITALS — BP 108/73 | HR 93 | Temp 98.7°F | Ht 63.0 in | Wt 122.0 lb

## 2012-06-30 DIAGNOSIS — Z3040 Encounter for surveillance of contraceptives, unspecified: Secondary | ICD-10-CM

## 2012-06-30 MED ORDER — MEDROXYPROGESTERONE ACETATE 150 MG/ML IM SUSP
150.0000 mg | Freq: Once | INTRAMUSCULAR | Status: AC
  Administered 2012-06-30: 150 mg via INTRAMUSCULAR

## 2012-06-30 NOTE — Progress Notes (Signed)
  Subjective:    Kathleen Powell is a 28 y.o. female who presents for Depo- Provera Injection. The patient has no complaints today. The patient is sexually active..  No LMP recorded. Patient has had an injection.    The following portions of the patient's history were reviewed and updated as appropriate: allergies, current medications, past family history, past medical history, past social history, past surgical history and problem list.

## 2012-06-30 NOTE — Patient Instructions (Signed)
  Return September 21, 2012 for next Depo Injection

## 2012-07-03 ENCOUNTER — Ambulatory Visit: Payer: Self-pay

## 2012-07-03 ENCOUNTER — Ambulatory Visit: Payer: Medicaid Other

## 2012-09-14 ENCOUNTER — Ambulatory Visit (INDEPENDENT_AMBULATORY_CARE_PROVIDER_SITE_OTHER): Payer: BC Managed Care – PPO | Admitting: *Deleted

## 2012-09-14 VITALS — BP 129/81 | HR 97 | Temp 98.1°F | Wt 128.0 lb

## 2012-09-14 DIAGNOSIS — Z3040 Encounter for surveillance of contraceptives, unspecified: Secondary | ICD-10-CM

## 2012-09-14 DIAGNOSIS — Z3049 Encounter for surveillance of other contraceptives: Secondary | ICD-10-CM

## 2012-09-14 MED ORDER — MEDROXYPROGESTERONE ACETATE 150 MG/ML IM SUSP
150.0000 mg | Freq: Once | INTRAMUSCULAR | Status: AC
  Administered 2012-09-14: 150 mg via INTRAMUSCULAR

## 2012-09-14 NOTE — Patient Instructions (Addendum)
Please return to office as schedule and as needed. Please call pharmacy for refills and pick-up prescription before next appointment.  

## 2012-09-14 NOTE — Progress Notes (Signed)
Pt tolerated well Depo-Provera 150 mg given in right deltoid.

## 2012-09-15 ENCOUNTER — Ambulatory Visit: Payer: BC Managed Care – PPO

## 2012-12-05 ENCOUNTER — Ambulatory Visit: Payer: BC Managed Care – PPO

## 2012-12-06 ENCOUNTER — Ambulatory Visit: Payer: Self-pay

## 2012-12-07 ENCOUNTER — Ambulatory Visit: Payer: Self-pay | Admitting: Obstetrics & Gynecology

## 2012-12-07 ENCOUNTER — Ambulatory Visit (INDEPENDENT_AMBULATORY_CARE_PROVIDER_SITE_OTHER): Payer: BC Managed Care – PPO | Admitting: *Deleted

## 2012-12-07 VITALS — BP 114/80 | HR 76 | Temp 98.9°F | Ht 63.0 in | Wt 127.0 lb

## 2012-12-07 DIAGNOSIS — Z309 Encounter for contraceptive management, unspecified: Secondary | ICD-10-CM

## 2012-12-07 MED ORDER — MEDROXYPROGESTERONE ACETATE 150 MG/ML IM SUSP: 150.0000 mg | INTRAMUSCULAR | Status: DC

## 2012-12-07 MED ORDER — MEDROXYPROGESTERONE ACETATE 150 MG/ML IM SUSP
150.0000 mg | INTRAMUSCULAR | Status: DC
  Administered 2012-12-07 – 2019-01-01 (×17): 150 mg via INTRAMUSCULAR

## 2012-12-07 NOTE — Progress Notes (Signed)
Patient is in the office today for her Depo Provera injection. Patient reports she is doing well with her shot. Patient has to be at work and will reschedule her annual exam.

## 2013-02-28 ENCOUNTER — Ambulatory Visit: Payer: BC Managed Care – PPO

## 2013-02-28 ENCOUNTER — Ambulatory Visit (INDEPENDENT_AMBULATORY_CARE_PROVIDER_SITE_OTHER): Payer: BC Managed Care – PPO | Admitting: *Deleted

## 2013-02-28 VITALS — BP 119/78 | HR 87 | Temp 98.1°F | Ht 64.0 in | Wt 128.0 lb

## 2013-02-28 DIAGNOSIS — IMO0001 Reserved for inherently not codable concepts without codable children: Secondary | ICD-10-CM

## 2013-02-28 DIAGNOSIS — Z309 Encounter for contraceptive management, unspecified: Secondary | ICD-10-CM

## 2013-02-28 MED ORDER — MEDROXYPROGESTERONE ACETATE 150 MG/ML IM SUSP: 150.0000 mg | INTRAMUSCULAR | Status: DC

## 2013-02-28 NOTE — Progress Notes (Signed)
Patient in office today for a Depo Injection. Patient is on time for her injection. Injection tolerated well.  Patient to return to office for May 22, 2013.

## 2013-05-22 ENCOUNTER — Ambulatory Visit (INDEPENDENT_AMBULATORY_CARE_PROVIDER_SITE_OTHER): Payer: BC Managed Care – PPO | Admitting: *Deleted

## 2013-05-22 ENCOUNTER — Ambulatory Visit: Payer: BC Managed Care – PPO

## 2013-05-22 VITALS — BP 115/77 | HR 89 | Temp 98.3°F | Wt 128.0 lb

## 2013-05-22 DIAGNOSIS — Z309 Encounter for contraceptive management, unspecified: Secondary | ICD-10-CM

## 2013-05-22 NOTE — Progress Notes (Signed)
Pt is in office today for Depo injection.  Injection given in right deltoid.  Pt tolerated well. Pt advised to RTO on 08-13-13 for next injection.

## 2013-08-13 ENCOUNTER — Ambulatory Visit (INDEPENDENT_AMBULATORY_CARE_PROVIDER_SITE_OTHER): Payer: BC Managed Care – PPO | Admitting: *Deleted

## 2013-08-13 VITALS — BP 112/76 | HR 83 | Temp 98.0°F | Ht 63.0 in | Wt 128.0 lb

## 2013-08-13 DIAGNOSIS — Z309 Encounter for contraceptive management, unspecified: Secondary | ICD-10-CM

## 2013-08-13 DIAGNOSIS — Z3042 Encounter for surveillance of injectable contraceptive: Secondary | ICD-10-CM

## 2013-08-13 DIAGNOSIS — Z3049 Encounter for surveillance of other contraceptives: Secondary | ICD-10-CM

## 2013-08-13 NOTE — Progress Notes (Signed)
Patient is in the office today for her DEPO Injection. Patient is on time for her Injection. Injection give in Left Deltoid. Patient tolerated well. Patient denies any concerns today. Patient advised to make an appointment with the front for November 04, 2013 for her next DEPO Injection. Patient voiced understanding.   BP 112/76  Pulse 83  Temp(Src) 98 F (36.7 C)  Ht 5\' 3"  (1.6 m)  Wt 128 lb (58.06 kg)  BMI 22.68 kg/m2  Administrations This Visit   medroxyPROGESTERone (DEPO-PROVERA) injection 150 mg   Administered Action Dose Route Administered By   08/13/2013 Given 150 mg Intramuscular Odessa FlemingKristina M Bohne, LPN

## 2013-11-05 ENCOUNTER — Ambulatory Visit: Payer: BC Managed Care – PPO

## 2013-11-12 ENCOUNTER — Ambulatory Visit (INDEPENDENT_AMBULATORY_CARE_PROVIDER_SITE_OTHER): Payer: 59 | Admitting: *Deleted

## 2013-11-12 VITALS — BP 121/76 | HR 88 | Temp 98.4°F | Wt 133.0 lb

## 2013-11-12 DIAGNOSIS — Z309 Encounter for contraceptive management, unspecified: Secondary | ICD-10-CM

## 2013-11-12 DIAGNOSIS — Z3202 Encounter for pregnancy test, result negative: Secondary | ICD-10-CM

## 2013-11-12 DIAGNOSIS — Z3049 Encounter for surveillance of other contraceptives: Secondary | ICD-10-CM

## 2013-11-12 DIAGNOSIS — Z3042 Encounter for surveillance of injectable contraceptive: Secondary | ICD-10-CM

## 2013-11-12 LAB — POCT URINE PREGNANCY: PREG TEST UR: NEGATIVE

## 2013-11-12 NOTE — Progress Notes (Signed)
Pt is in office today for depo injection.  Pt is 8 days late for injection.  Pt states that she last had intercourse 2 weeks ago.  UPT in office today is negative.  Per Dr Tamela Oddi, Depo injection can be given at today visit.    BP 121/76  Pulse 88  Temp(Src) 98.4 F (36.9 C)  Wt 133 lb (60.328 kg)   Administrations This Visit   medroxyPROGESTERone (DEPO-PROVERA) injection 150 mg   Administered Action Dose Route Administered By   11/12/2013 Given 150 mg Intramuscular Lanney Gins, CMA

## 2014-02-04 ENCOUNTER — Ambulatory Visit: Payer: 59

## 2014-02-06 ENCOUNTER — Ambulatory Visit (INDEPENDENT_AMBULATORY_CARE_PROVIDER_SITE_OTHER): Payer: 59 | Admitting: *Deleted

## 2014-02-06 VITALS — BP 121/73 | HR 105 | Temp 98.4°F | Ht 64.0 in | Wt 133.0 lb

## 2014-02-06 DIAGNOSIS — Z309 Encounter for contraceptive management, unspecified: Secondary | ICD-10-CM

## 2014-02-06 DIAGNOSIS — Z3042 Encounter for surveillance of injectable contraceptive: Secondary | ICD-10-CM

## 2014-02-06 NOTE — Progress Notes (Signed)
Patient in the office today for a Depo Injection. Patient is on time for her injection.   Patient to return to office 05-01-14.  BP 121/73 mmHg  Pulse 105  Temp(Src) 98.4 F (36.9 C)  Ht 5\' 4"  (1.626 m)  Wt 133 lb (60.328 kg)  BMI 22.82 kg/m2  Administrations This Visit    medroxyPROGESTERone (DEPO-PROVERA) injection 150 mg    Administered Action Dose Route Administered By         02/06/2014 Given 150 mg Intramuscular Shelda PalAndrea Lynn Jermery Caratachea, LPN

## 2014-04-30 ENCOUNTER — Other Ambulatory Visit: Payer: Self-pay | Admitting: *Deleted

## 2014-04-30 ENCOUNTER — Other Ambulatory Visit: Payer: Self-pay | Admitting: Obstetrics

## 2014-04-30 DIAGNOSIS — Z3042 Encounter for surveillance of injectable contraceptive: Secondary | ICD-10-CM

## 2014-04-30 MED ORDER — MEDROXYPROGESTERONE ACETATE 150 MG/ML IM SUSP
150.0000 mg | INTRAMUSCULAR | Status: DC
Start: 2014-04-30 — End: 2015-03-31

## 2014-04-30 NOTE — Telephone Encounter (Signed)
Patient has been scheduled for annual and depo tomorrow.

## 2014-05-01 ENCOUNTER — Ambulatory Visit (INDEPENDENT_AMBULATORY_CARE_PROVIDER_SITE_OTHER): Payer: 59 | Admitting: Certified Nurse Midwife

## 2014-05-01 ENCOUNTER — Ambulatory Visit: Payer: Medicaid Other | Admitting: Certified Nurse Midwife

## 2014-05-01 ENCOUNTER — Ambulatory Visit: Payer: 59

## 2014-05-01 ENCOUNTER — Encounter: Payer: Self-pay | Admitting: Certified Nurse Midwife

## 2014-05-01 VITALS — BP 111/70 | HR 99 | Temp 98.4°F | Ht 65.0 in | Wt 132.0 lb

## 2014-05-01 DIAGNOSIS — Z01419 Encounter for gynecological examination (general) (routine) without abnormal findings: Secondary | ICD-10-CM | POA: Diagnosis not present

## 2014-05-01 DIAGNOSIS — Z3042 Encounter for surveillance of injectable contraceptive: Secondary | ICD-10-CM

## 2014-05-01 DIAGNOSIS — Z309 Encounter for contraceptive management, unspecified: Secondary | ICD-10-CM

## 2014-05-01 MED ORDER — PRENATE RESTORE 27-0.6-0.4-400 MG PO CAPS: 1.0000 | ORAL_CAPSULE | Freq: Every day | ORAL | Status: DC

## 2014-05-01 NOTE — Progress Notes (Signed)
Pt received depo injection at today's visit.  Pt tolerated well.  Pt advised to RTO on 07-23-14 for next injection.  Administrations This Visit    medroxyPROGESTERone (DEPO-PROVERA) injection 150 mg    Admin Date Action Dose Route Administered By         05/01/2014 Given 150 mg Intramuscular Lanney GinsSuzanne D Foster, CMA

## 2014-05-01 NOTE — Progress Notes (Signed)
Patient ID: Kathleen Powell, female   DOB: 15-Sep-1985, 29 y.o.   MRN: [id number]   Subjective:     Kathleen Powell is a 29 y.o. female here for a routine exam.  Current complaints: none, desires to continue with Depo-provera injections for contraception.  Denies any new sexual partners or dyspareunia.   Was dx with strep throat two days ago, on antibiotics, tolerating well.  Amenorrhea from Depo-provera injections.  Education given about duration of 5 years for Depo injections.  Encouraged good [employer] support for duration of Depo injections.    Personal health questionnaire:  Is patient [name], have a family history of breast and/or ovarian cancer: no Is there a family history of uterine cancer diagnosed at age < 56, gastrointestinal cancer, urinary tract cancer, family member who is a Personnel officer syndrome-associated carrier: no Is the patient overweight and hypertensive, family history of diabetes, personal history of gestational diabetes, preeclampsia or PCOS: no Is patient over 65, have PCOS,  family history of premature CHD under age 57, diabetes, smoke, have hypertension or peripheral artery disease:  no At any time, has a partner hit, kicked or otherwise hurt or frightened you?: no Over the past 2 weeks, have you felt down, depressed or hopeless?: no Over the past 2 weeks, have you felt little interest or pleasure in doing things?:no   Gynecologic History No LMP recorded. Patient has had an injection. Contraception: Depo-Provera injections Last Pap: unkown. Results were: normal according to patient.  Last mammogram: N/A.   Obstetric History OB History  Gravida Para Term Preterm AB SAB TAB Ectopic Multiple Living  # Outcome Date GA Lbr Len/2nd Weight Sex Delivery Anes PTL Lv  1 Term 11/27/11 [redacted]w[redacted]d  2.815 kg (6 lb 3.3 oz) M CS-LTranv EPI  Y      Past Medical History  Diagnosis Date  . No pertinent past medical history     Past Surgical History  Procedure  Laterality Date  . No past surgeries    . Cesarean section  11/27/2011    Procedure: CESAREAN SECTION;  Surgeon: Kathreen Cosier, MD;  Location: [facility];  Service: Obstetrics;  Laterality: N/A;  Primary cesarean section with delivery of baby boy at 17. Apgars 8/9.     Current outpatient prescriptions:  .  ibuprofen (ADVIL,MOTRIN) 600 MG tablet, Take 1 tablet (600 mg total) by mouth every 6 (six) hours as needed for pain., Disp: 30 tablet, Rfl: 5 .  medroxyPROGESTERone (DEPO-PROVERA) 150 MG/ML injection, Inject 1 mL (150 mg total) into the muscle every 3 (three) months., Disp: 1 mL, Rfl: 3 .  Prenat w/o A-FE-Methfol-FA-DHA (PRENATE RESTORE) 27-0.6-0.4-400 MG CAPS, Take 1 tablet by mouth daily., Disp: 30 capsule, Rfl: 12  Current facility-administered medications:  .  medroxyPROGESTERone (DEPO-PROVERA) injection 150 mg, 150 mg, Intramuscular, Q90 days, Antionette Char, MD, 150 mg at 02/06/14 1336 No Known Allergies  History  Substance Use Topics  . Smoking status: Current Every Day Smoker -- 0.25 packs/day    Last Attempt to Quit: 09/09/2011  . Smokeless tobacco: Never Used  . Alcohol Use: No    Family History  Problem Relation Age of Onset  . Anesthesia problems Neg Hx   . Heart disease Maternal Grandmother     congestive heart failure  . Hypertension Maternal Grandmother   . Heart attack Maternal Grandmother   . Hypertension Mother   . Heart disease Father     pace  maker  . Asthma Sister   . Gout Maternal Grandfather       Review of Systems  Constitutional: negative for fatigue and weight loss Respiratory: negative for cough and wheezing Cardiovascular: negative for chest pain, fatigue and palpitations Gastrointestinal: negative for abdominal pain and change in bowel habits Musculoskeletal:negative for myalgias Neurological: negative for gait problems and tremors Behavioral/Psych: negative for abusive relationship, depression Endocrine: negative for temperature  intolerance   Genitourinary:negative for abnormal menstrual periods, genital lesions, hot flashes, sexual problems and vaginal discharge Integument/breast: negative for breast lump, breast tenderness, nipple discharge and skin lesion(s)    Objective:       BP 111/70 mmHg  Pulse 99  Temp(Src) 98.4 F (36.9 C)  Ht 5\' 5"  (1.651 m)  Wt 59.875 kg (132 lb)  BMI 21.97 kg/m2 General:   alert  Skin:   no rash or abnormalities  Lungs:   clear to auscultation bilaterally  Heart:   regular rate and rhythm, S1, S2 normal, no murmur, click, rub or gallop  Breasts:   normal without suspicious masses, skin or nipple changes or axillary nodes  Abdomen:  normal findings: no organomegaly, soft, non-tender and no hernia  Pelvis:  External genitalia: normal general appearance Urinary system: urethral meatus normal and bladder without fullness, nontender Vaginal: normal without tenderness, induration or masses Cervix: normal appearance Adnexa: normal bimanual exam Uterus: anteverted and non-tender, normal size   Lab Review Urine pregnancy test Labs reviewed yes Radiologic studies reviewed no   Assessment:    Healthy female exam.   Amenorrhea d/t contraception   Plan:    Education reviewed: calcium supplements, low fat, low cholesterol diet, safe sex/STD prevention, self breast exams, skin cancer screening and weight bearing exercise. Contraception: Depo-Provera injections. Follow up in: 1 year.   Meds ordered this encounter  Medications  . Prenat w/o A-FE-Methfol-FA-DHA (PRENATE RESTORE) 27-0.6-0.4-400 MG CAPS    Sig: Take 1 tablet by mouth daily.    Dispense:  30 capsule    Refill:  12   No orders of the defined types were placed in this encounter.

## 2014-05-07 ENCOUNTER — Telehealth: Payer: Self-pay | Admitting: *Deleted

## 2014-05-07 NOTE — Telephone Encounter (Signed)
Call was received from [facility] regarding specimen that they have received on pt.  [name] states they have specimen with no requisition only orders on script note.  Return call to [name].  LM making her aware that for pt that are needing labs processed elsewhere our orders are wrote on a script pad and to be sent with specimen.  Advised to call office if further clarification is needed.

## 2014-05-08 ENCOUNTER — Other Ambulatory Visit: Payer: Self-pay | Admitting: Certified Nurse Midwife

## 2014-05-15 ENCOUNTER — Other Ambulatory Visit: Payer: Self-pay | Admitting: *Deleted

## 2014-05-15 ENCOUNTER — Telehealth: Payer: Self-pay | Admitting: *Deleted

## 2014-05-15 DIAGNOSIS — N760 Acute vaginitis: Principal | ICD-10-CM

## 2014-05-15 DIAGNOSIS — B9689 Other specified bacterial agents as the cause of diseases classified elsewhere: Secondary | ICD-10-CM

## 2014-05-15 MED ORDER — TINIDAZOLE 500 MG PO TABS: 2.0000 g | ORAL_TABLET | Freq: Every day | ORAL | Status: DC

## 2014-05-15 NOTE — Telephone Encounter (Signed)
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  I will have her patient informaiton scanned into the chart.  She has BV.  Please give her Tindamax 2gm/day in the AM with food for 3 days.  No refills.  Also, if prone to yeast infections afterward give Diflucan.   Thank you.

## 2014-05-15 NOTE — Telephone Encounter (Signed)
Left message on VM- received all labs. Pap normal. All cultures negative except +BV. Antibiotic sent to pharmacy of record.

## 2014-07-23 ENCOUNTER — Ambulatory Visit (INDEPENDENT_AMBULATORY_CARE_PROVIDER_SITE_OTHER): Payer: 59 | Admitting: *Deleted

## 2014-07-23 VITALS — BP 105/77 | HR 82 | Temp 98.1°F | Ht 64.0 in | Wt 135.0 lb

## 2014-07-23 DIAGNOSIS — Z3042 Encounter for surveillance of injectable contraceptive: Secondary | ICD-10-CM

## 2014-07-23 DIAGNOSIS — Z309 Encounter for contraceptive management, unspecified: Secondary | ICD-10-CM | POA: Diagnosis not present

## 2014-07-23 NOTE — Progress Notes (Signed)
Patient in office today for a Depo Injection. Patient is on time for her injection.  Patient tolerated injection well.  Patient due for her next injection on October 14, 2014.   BP 105/77 mmHg  Pulse 82  Temp(Src) 98.1 F (36.7 C)  Ht 5\' 4"  (1.626 m)  Wt 135 lb (61.236 kg)  BMI 23.16 kg/m2  Administrations This Visit    medroxyPROGESTERone (DEPO-PROVERA) injection 150 mg    Admin Date Action Dose Route Administered By         07/23/2014 Given 150 mg Intramuscular Henriette CombsAndrea L Ziad Maye, LPN

## 2014-10-14 ENCOUNTER — Ambulatory Visit (INDEPENDENT_AMBULATORY_CARE_PROVIDER_SITE_OTHER): Payer: 59 | Admitting: *Deleted

## 2014-10-14 VITALS — BP 108/75 | HR 80 | Wt 137.0 lb

## 2014-10-14 DIAGNOSIS — Z3040 Encounter for surveillance of contraceptives, unspecified: Secondary | ICD-10-CM

## 2014-10-14 DIAGNOSIS — Z3042 Encounter for surveillance of injectable contraceptive: Secondary | ICD-10-CM

## 2014-10-14 NOTE — Progress Notes (Signed)
Pt is in office for depo injection.  Pt is on time for her injection.  Pt states that she doing well, has no other concerns today.  Pt tolerated injection well. Pt advised to RTO 01-05-15 for next injection.     BP 108/75 mmHg  Pulse 80  Wt 137 lb (62.143 kg)  Administrations This Visit    medroxyPROGESTERone (DEPO-PROVERA) injection 150 mg    Admin Date Action Dose Route Administered By         10/14/2014 Given 150 mg Intramuscular Lanney Gins, CMA

## 2015-01-06 ENCOUNTER — Ambulatory Visit (INDEPENDENT_AMBULATORY_CARE_PROVIDER_SITE_OTHER): Payer: 59 | Admitting: *Deleted

## 2015-01-06 VITALS — BP 115/79 | HR 103 | Temp 98.4°F | Ht 64.0 in | Wt 139.0 lb

## 2015-01-06 DIAGNOSIS — Z3042 Encounter for surveillance of injectable contraceptive: Secondary | ICD-10-CM | POA: Diagnosis not present

## 2015-01-06 MED ORDER — MEDROXYPROGESTERONE ACETATE 150 MG/ML IM SUSP
150.0000 mg | INTRAMUSCULAR | Status: AC
  Administered 2015-01-06 – 2015-04-01 (×2): 150 mg via INTRAMUSCULAR

## 2015-01-06 NOTE — Progress Notes (Signed)
Patient in office today for a Depo injection. Patient is on time for injection.  Patient tolerated injection well. Patient due for next injection on 03-30-15.  BP 115/79 mmHg  Pulse 103  Temp(Src) 98.4 F (36.9 C)  Ht 5\' 4"  (1.626 m)  Wt 139 lb (63.05 kg)  BMI 23.85 kg/m2  Administrations This Visit    medroxyPROGESTERone (DEPO-PROVERA) injection 150 mg    Admin Date Action Dose Route Administered By         01/06/2015 Given 150 mg Intramuscular Henriette CombsAndrea L Ardra Kuznicki, LPN

## 2015-03-31 ENCOUNTER — Telehealth: Payer: Self-pay | Admitting: *Deleted

## 2015-03-31 ENCOUNTER — Ambulatory Visit: Payer: 59

## 2015-03-31 DIAGNOSIS — Z3042 Encounter for surveillance of injectable contraceptive: Secondary | ICD-10-CM

## 2015-03-31 MED ORDER — MEDROXYPROGESTERONE ACETATE 150 MG/ML IM SUSP: 150.0000 mg | INTRAMUSCULAR | Status: DC

## 2015-03-31 NOTE — Telephone Encounter (Signed)
Patient has an appointment on Monday and she needs a refill on her Depo. 2/13 1:20 Got call this am- refill sent to the pharmacy for patient. She is due her annual exam in March

## 2015-04-01 ENCOUNTER — Ambulatory Visit (INDEPENDENT_AMBULATORY_CARE_PROVIDER_SITE_OTHER): Payer: 59 | Admitting: *Deleted

## 2015-04-01 VITALS — BP 107/74 | HR 85 | Wt 139.0 lb

## 2015-04-01 DIAGNOSIS — Z3042 Encounter for surveillance of injectable contraceptive: Secondary | ICD-10-CM | POA: Diagnosis not present

## 2015-04-01 NOTE — Progress Notes (Signed)
Pt is in office for depo injection. Pt is on time for her injection. Injection given, pt tolerated well.  Pt advised to RTO on 06-23-15 for next injection.  BP 107/74 mmHg  Pulse 85  Wt 139 lb (63.05 kg)  Administrations This Visit    medroxyPROGESTERone (DEPO-PROVERA) injection 150 mg    Admin Date Action Dose Route Administered By         04/01/2015 Given 150 mg Intramuscular Lanney Gins, CMA

## 2015-06-23 ENCOUNTER — Ambulatory Visit: Payer: 59

## 2015-06-23 VITALS — BP 121/73 | HR 83 | Temp 99.4°F | Wt 144.0 lb

## 2015-06-23 DIAGNOSIS — Z3042 Encounter for surveillance of injectable contraceptive: Secondary | ICD-10-CM

## 2015-06-23 MED ORDER — MEDROXYPROGESTERONE ACETATE 150 MG/ML IM SUSP
150.0000 mg | Freq: Once | INTRAMUSCULAR | Status: AC
  Administered 2015-06-23: 150 mg via INTRAMUSCULAR

## 2015-06-23 NOTE — Progress Notes (Unsigned)
Patient tolerated Depo injection well in L-arm. RTO 09-14-15

## 2015-09-11 ENCOUNTER — Encounter (HOSPITAL_COMMUNITY): Payer: Self-pay | Admitting: Emergency Medicine

## 2015-09-11 ENCOUNTER — Ambulatory Visit (HOSPITAL_COMMUNITY)
Admission: EM | Admit: 2015-09-11 | Discharge: 2015-09-11 | Disposition: A | Discharge status: Home / Self Care | Payer: 59 | Attending: Physician Assistant | Admitting: Physician Assistant

## 2015-09-11 DIAGNOSIS — H8110 Benign paroxysmal vertigo, unspecified ear: Secondary | ICD-10-CM | POA: Diagnosis not present

## 2015-09-11 LAB — POCT URINALYSIS DIP (DEVICE)
Bilirubin Urine: NEGATIVE
Glucose, UA: NEGATIVE mg/dL
HGB URINE DIPSTICK: NEGATIVE
LEUKOCYTES UA: NEGATIVE
NITRITE: NEGATIVE
PH: 7 (ref 5.0–8.0)
Protein, ur: 30 mg/dL — AB
Specific Gravity, Urine: 1.02 (ref 1.005–1.030)
UROBILINOGEN UA: 1 mg/dL (ref 0.0–1.0)

## 2015-09-11 LAB — POCT PREGNANCY, URINE: Preg Test, Ur: NEGATIVE

## 2015-09-11 LAB — GLUCOSE, CAPILLARY: Glucose-Capillary: 91 mg/dL (ref 65–99)

## 2015-09-11 MED ORDER — MECLIZINE HCL 12.5 MG PO TABS: 12.5000 mg | ORAL_TABLET | Freq: Three times a day (TID) | ORAL | 0 refills | Status: DC | PRN

## 2015-09-11 NOTE — ED Provider Notes (Signed)
CSN: [id number]     Arrival date & time 09/11/15  1334 History   None    Chief Complaint  Patient presents with  . Weakness   (Consider location/radiation/quality/duration/timing/severity/associated sxs/prior Treatment) HPI 30 year old female states that she was working today when she sat up and became dizzy and felt as if the room was spinning. She denies any nausea or vomiting. No recent head trauma. States that she had not eaten lunch when this happened. But does not have any sensation of low blood sugar at this time. Previous symptoms of this nature. Patient was able to walk without impairment to her car by her supervisor while she waited for someone to pick her up. Past Medical History:  Diagnosis Date  . No pertinent past medical history    Past Surgical History:  Procedure Laterality Date  . CESAREAN SECTION  11/27/2011   Procedure: CESAREAN SECTION;  Surgeon: Kathreen Cosier, MD;  Location: [facility];  Service: Obstetrics;  Laterality: N/A;  Primary cesarean section with delivery of baby boy at 59. Apgars 8/9.  Marland Kitchen NO PAST SURGERIES     Family History  Problem Relation Age of Onset  . Anesthesia problems Neg Hx   . Heart disease Maternal Grandmother     congestive heart failure  . Hypertension Maternal Grandmother   . Heart attack Maternal Grandmother   . Hypertension Mother   . Cancer Mother   . Heart disease Father     Visual merchandiser  . Asthma Sister   . Gout Maternal Grandfather    Social History  Substance Use Topics  . Smoking status: Current Some Day Smoker    Packs/day: 0.25    Last attempt to quit: 09/09/2011  . Smokeless tobacco: Never Used  . Alcohol use No   OB History    Gravida Para Term Preterm AB Living   1 1 1     1    SAB TAB Ectopic Multiple Live Births           1     Review of Systems  Denies: HEADACHE, NAUSEA, ABDOMINAL PAIN, CHEST PAIN, CONGESTION, DYSURIA, SHORTNESS OF BREATH  Allergies  Review of patient's allergies indicates no known  allergies.  Home Medications   Prior to Admission medications   Medication Sig Start Date End Date Taking? Authorizing Provider  meclizine (ANTIVERT) 12.5 MG tablet Take 1 tablet (12.5 mg total) by mouth 3 (three) times daily as needed for dizziness. 09/11/15   Tharon Aquas, PA  medroxyPROGESTERone (DEPO-PROVERA) 150 MG/ML injection Inject 1 mL (150 mg total) into the muscle every 3 (three) months. 03/31/15   Brock Bad, MD   Meds Ordered and Administered this Visit  Medications - No data to display  BP 124/72 (BP Location: Right Arm)   Pulse 90   Temp 99.1 F (37.3 C) (Oral)   Resp 12   SpO2 100%  No data found.   Physical Exam NURSES NOTES AND VITAL SIGNS REVIEWED. CONSTITUTIONAL: Well developed, well nourished, no acute distress HEENT: normocephalic, atraumatic EYES: Conjunctiva normal, no nystagmus, normal funduscopic exam. NECK:normal ROM, supple, no adenopathy PULMONARY:No respiratory distress, normal effort ABDOMINAL: Soft, ND, NT BS+, No CVAT MUSCULOSKELETAL: Normal ROM of all extremities,  SKIN: warm and dry without rash PSYCHIATRIC: Mood and affect, behavior are normal .  NEUROLOGICAL SCREENING EXAM: Constitutional:  oriented to person, place, and time.  Neurological:  .  normal strength and normal reflexes. No cranial nerve deficit or sensory deficit. negative Romberg sign. GCS eye  subscore is 4. GCS verbal subscore is 5. GCS motor subscore is 6.    Urgent Care Course   Clinical Course    Procedures (including critical care time)  Labs Review Labs Reviewed  POCT URINALYSIS DIP (DEVICE) - Abnormal; Notable for the following:       Result Value   Ketones, ur TRACE (*)    Protein, ur 30 (*)    All other components within normal limits  GLUCOSE, CAPILLARY  POCT PREGNANCY, URINE    Imaging Review No results found.   Visual Acuity Review  Right Eye Distance:   Left Eye Distance:   Bilateral Distance:    Right Eye Near:   Left Eye Near:     Bilateral Near:        Prescription for meclizine as provided. There are no sinister neurological symptoms or signs noted at this time. I do not feel the patient needs to be transferred to the emergency department for emergent imaging at this time. I have suggested to the family and the patient that she go home and rest and if symptoms worsen then go to the emergency department. Return to work note is provided for the patient. MDM   1. BPV (benign positional vertigo), unspecified laterality     Patient is reassured that there are no issues that require transfer to higher level of care at this time or additional tests. Patient is advised to continue home symptomatic treatment. Patient is advised that if there are new or worsening symptoms to attend the emergency department, contact primary care provider, or return to UC. Instructions of care provided discharged home in stable condition.    THIS NOTE WAS GENERATED USING A VOICE RECOGNITION SOFTWARE PROGRAM. ALL REASONABLE EFFORTS  WERE MADE TO PROOFREAD THIS DOCUMENT FOR ACCURACY.  I have verbally reviewed the discharge instructions with the patient. A printed AVS was given to the patient.  All questions were answered prior to discharge.      Tharon Aquas, PA 09/11/15 [phone]

## 2015-09-11 NOTE — ED Triage Notes (Signed)
The patient presented to the [facility] with a complaint of weakness and dizziness that started today while she was at work. The patient denied any associated pain.

## 2015-09-15 ENCOUNTER — Ambulatory Visit: Payer: 59

## 2015-09-17 ENCOUNTER — Ambulatory Visit (INDEPENDENT_AMBULATORY_CARE_PROVIDER_SITE_OTHER): Payer: 59 | Admitting: Pediatrics

## 2015-09-17 DIAGNOSIS — Z3042 Encounter for surveillance of injectable contraceptive: Secondary | ICD-10-CM

## 2015-09-17 MED ORDER — MEDROXYPROGESTERONE ACETATE 150 MG/ML IM SUSP
150.0000 mg | Freq: Once | INTRAMUSCULAR | Status: AC
  Administered 2015-09-17: 150 mg via INTRAMUSCULAR

## 2015-12-17 ENCOUNTER — Other Ambulatory Visit: Payer: Self-pay | Admitting: Obstetrics

## 2015-12-17 DIAGNOSIS — Z3042 Encounter for surveillance of injectable contraceptive: Secondary | ICD-10-CM

## 2015-12-18 ENCOUNTER — Ambulatory Visit (INDEPENDENT_AMBULATORY_CARE_PROVIDER_SITE_OTHER): Payer: 59 | Admitting: *Deleted

## 2015-12-18 ENCOUNTER — Ambulatory Visit: Payer: Self-pay

## 2015-12-18 VITALS — BP 121/78 | HR 86 | Wt 142.0 lb

## 2015-12-18 DIAGNOSIS — Z3042 Encounter for surveillance of injectable contraceptive: Secondary | ICD-10-CM | POA: Diagnosis not present

## 2015-12-18 NOTE — Progress Notes (Signed)
Patient is a day late for her injection- she state she has not been active in a year.

## 2016-03-18 ENCOUNTER — Other Ambulatory Visit: Payer: Self-pay

## 2016-03-18 ENCOUNTER — Other Ambulatory Visit: Payer: Self-pay | Admitting: Obstetrics

## 2016-03-18 DIAGNOSIS — Z3042 Encounter for surveillance of injectable contraceptive: Secondary | ICD-10-CM

## 2016-03-18 MED ORDER — MEDROXYPROGESTERONE ACETATE 150 MG/ML IM SUSP: 150.0000 mg | Freq: Once | INTRAMUSCULAR | 0 refills | Status: DC

## 2016-03-18 NOTE — Progress Notes (Signed)
TC from pt scheduled tomorrow for Depo needs rf. Pt due for AEX. Pt understands after this rf, she needs to schedule an annual before additional rf's are approved per protocol.

## 2016-03-19 ENCOUNTER — Ambulatory Visit (INDEPENDENT_AMBULATORY_CARE_PROVIDER_SITE_OTHER): Payer: 59

## 2016-03-19 VITALS — BP 113/77 | HR 98 | Wt 143.0 lb

## 2016-03-19 DIAGNOSIS — Z3042 Encounter for surveillance of injectable contraceptive: Secondary | ICD-10-CM | POA: Diagnosis not present

## 2016-03-19 DIAGNOSIS — Z3202 Encounter for pregnancy test, result negative: Secondary | ICD-10-CM | POA: Diagnosis not present

## 2016-03-19 LAB — POCT URINE PREGNANCY: Preg Test, Ur: NEGATIVE

## 2016-03-19 NOTE — Progress Notes (Signed)
Patient came in for DEPO Shot. UPT-NEG. Next dates April 20 - Jun 18, 2016.

## 2016-06-07 ENCOUNTER — Telehealth: Payer: Self-pay | Admitting: *Deleted

## 2016-06-07 ENCOUNTER — Other Ambulatory Visit: Payer: Self-pay | Admitting: *Deleted

## 2016-06-07 DIAGNOSIS — Z3042 Encounter for surveillance of injectable contraceptive: Secondary | ICD-10-CM

## 2016-06-07 MED ORDER — MEDROXYPROGESTERONE ACETATE 150 MG/ML IM SUSP: 150.0000 mg | Freq: Once | INTRAMUSCULAR | 0 refills | Status: DC

## 2016-06-07 NOTE — Telephone Encounter (Signed)
Refill has been sent. However, if pt misses this appt or injection she may not receive any additional refills until she has been seen by a provider. I would stress to her to keep AEX appt.

## 2016-06-07 NOTE — Progress Notes (Signed)
See phone note

## 2016-06-07 NOTE — Telephone Encounter (Signed)
Pt is coming in on the 25th for Annual and Depo shot, pt needs a script for Depo sent to her pharmacy for her appt. Please advise.Marland KitchenMarland KitchenMarland Kitchen

## 2016-06-07 NOTE — Telephone Encounter (Signed)
error 

## 2016-06-09 ENCOUNTER — Encounter: Payer: Self-pay | Admitting: Obstetrics

## 2016-06-09 ENCOUNTER — Ambulatory Visit (INDEPENDENT_AMBULATORY_CARE_PROVIDER_SITE_OTHER): Payer: 59 | Admitting: Obstetrics

## 2016-06-09 ENCOUNTER — Ambulatory Visit: Payer: 59

## 2016-06-09 VITALS — BP 120/68 | HR 85 | Ht 63.0 in | Wt 147.0 lb

## 2016-06-09 DIAGNOSIS — Z113 Encounter for screening for infections with a predominantly sexual mode of transmission: Secondary | ICD-10-CM

## 2016-06-09 DIAGNOSIS — Z3042 Encounter for surveillance of injectable contraceptive: Secondary | ICD-10-CM | POA: Diagnosis not present

## 2016-06-09 DIAGNOSIS — Z124 Encounter for screening for malignant neoplasm of cervix: Secondary | ICD-10-CM | POA: Diagnosis not present

## 2016-06-09 DIAGNOSIS — Z01419 Encounter for gynecological examination (general) (routine) without abnormal findings: Secondary | ICD-10-CM

## 2016-06-09 DIAGNOSIS — Z1151 Encounter for screening for human papillomavirus (HPV): Secondary | ICD-10-CM

## 2016-06-09 NOTE — Progress Notes (Signed)
Subjective:        Kathleen Powell is a 31 y.o. female here for a routine exam.  Current complaints: None.    Personal health questionnaire:  Is patient [name], have a family history of breast and/or ovarian cancer: no Is there a family history of uterine cancer diagnosed at age < 106, gastrointestinal cancer, urinary tract cancer, family member who is a Personnel officer syndrome-associated carrier: no Is the patient overweight and hypertensive, family history of diabetes, personal history of gestational diabetes, preeclampsia or PCOS: no Is patient over 51, have PCOS,  family history of premature CHD under age 91, diabetes, smoke, have hypertension or peripheral artery disease:  no At any time, has a partner hit, kicked or otherwise hurt or frightened you?: no Over the past 2 weeks, have you felt down, depressed or hopeless?: no Over the past 2 weeks, have you felt little interest or pleasure in doing things?:no   Gynecologic History No LMP recorded. Patient has had an injection. Contraception: Depo-Provera injections Last Pap: 2016. Results were: normal Last mammogram: n/a. Results were: n/a  Obstetric History OB History  Gravida Para Term Preterm AB Living  SAB TAB Ectopic Multiple Live Births          1    # Outcome Date GA Lbr Len/2nd Weight Sex Delivery Anes PTL Lv  1 Term 11/27/11 [redacted]w[redacted]d  6 lb 3.3 oz (2.815 kg) M CS-LTranv EPI  LIV      Past Medical History:  Diagnosis Date  . No pertinent past medical history   . Vertigo     Past Surgical History:  Procedure Laterality Date  . CESAREAN SECTION  11/27/2011   Procedure: CESAREAN SECTION;  Surgeon: Kathreen Cosier, MD;  Location: [facility];  Service: Obstetrics;  Laterality: N/A;  Primary cesarean section with delivery of baby boy at 69. Apgars 8/9.  Marland Kitchen NO PAST SURGERIES       Current Outpatient Prescriptions:  .  cetirizine (ZYRTEC) 10 MG tablet, Take 10 mg by mouth daily., Disp: , Rfl:  .   meclizine (ANTIVERT) 12.5 MG tablet, Take 1 tablet (12.5 mg total) by mouth 3 (three) times daily as needed for dizziness. (Patient not taking: Reported on 06/09/2016), Disp: 30 tablet, Rfl: 0 .  medroxyPROGESTERone (DEPO-PROVERA) 150 MG/ML injection, Inject 1 mL (150 mg total) into the muscle once., Disp: 1 mL, Rfl: 0  Current Facility-Administered Medications:  .  medroxyPROGESTERone (DEPO-PROVERA) injection 150 mg, 150 mg, Intramuscular, Q90 days, Antionette Char, MD, 150 mg at 03/19/16 4540 No Known Allergies  Social History  Substance Use Topics  . Smoking status: Current Some Day Smoker    Packs/day: 0.25    Last attempt to quit: 09/09/2011  . Smokeless tobacco: Never Used  . Alcohol use No    Family History  Problem Relation Age of Onset  . Hypertension Mother   . Cancer Mother   . Heart disease Father     Visual merchandiser  . Heart disease Maternal Grandmother     congestive heart failure  . Hypertension Maternal Grandmother   . Heart attack Maternal Grandmother   . Asthma Sister   . Gout Maternal Grandfather   . Anesthesia problems Neg Hx       Review of Systems  Constitutional: negative for fatigue and weight loss Respiratory: negative for cough and wheezing Cardiovascular: negative for chest pain, fatigue and palpitations Gastrointestinal: negative for abdominal pain and change in  bowel habits Musculoskeletal:negative for myalgias Neurological: negative for gait problems and tremors Behavioral/Psych: negative for abusive relationship, depression Endocrine: negative for temperature intolerance    Genitourinary:negative for abnormal menstrual periods, genital lesions, hot flashes, sexual problems and vaginal discharge Integument/breast: negative for breast lump, breast tenderness, nipple discharge and skin lesion(s)    Objective:       BP 120/68   Pulse 85   Ht  (1.6 m)   Wt 147 lb (66.7 kg)   BMI 26.04 kg/m  General:   alert  Skin:   no rash or  abnormalities  Lungs:   clear to auscultation bilaterally  Heart:   regular rate and rhythm, S1, S2 normal, no murmur, click, rub or gallop  Breasts:   normal without suspicious masses, skin or nipple changes or axillary nodes  Abdomen:  normal findings: no organomegaly, soft, non-tender and no hernia  Pelvis:  External genitalia: normal general appearance Urinary system: urethral meatus normal and bladder without fullness, nontender Vaginal: normal without tenderness, induration or masses Cervix: normal appearance Adnexa: normal bimanual exam Uterus: anteverted and non-tender, normal size   Lab Review Urine pregnancy test Labs reviewed yes Radiologic studies reviewed no  50% of 20 min visit spent on counseling and coordination of care.    Assessment:    Healthy female exam.    Contraceptive Surveillance.  Pleased with Depo Provera   Plan:    Education reviewed: calcium supplements, depression evaluation, low fat, low cholesterol diet, safe sex/STD prevention, self breast exams, smoking cessation and weight bearing exercise. Contraception: Depo-Provera injections. Follow up in: 1 year.   Meds ordered this encounter  Medications  . cetirizine (ZYRTEC) 10 MG tablet    Sig: Take 10 mg by mouth daily.   No orders of the defined types were placed in this encounter.    Patient ID: [name], female   DOB: October 27, 1985, 31 y.o.   MRN: [id number]

## 2016-06-10 LAB — CERVICOVAGINAL ANCILLARY ONLY
Bacterial vaginitis: NEGATIVE
CANDIDA VAGINITIS: NEGATIVE
CHLAMYDIA, DNA PROBE: NEGATIVE
Neisseria Gonorrhea: NEGATIVE
TRICH (WINDOWPATH): NEGATIVE

## 2016-06-10 LAB — CYTOLOGY - PAP
DIAGNOSIS: NEGATIVE
HPV: NOT DETECTED

## 2016-08-30 ENCOUNTER — Ambulatory Visit: Payer: 59

## 2016-09-06 ENCOUNTER — Ambulatory Visit (INDEPENDENT_AMBULATORY_CARE_PROVIDER_SITE_OTHER): Payer: 59

## 2016-09-06 DIAGNOSIS — Z3042 Encounter for surveillance of injectable contraceptive: Secondary | ICD-10-CM | POA: Diagnosis not present

## 2016-09-06 NOTE — Progress Notes (Signed)
Nurse visit for pt supply Depo given R del w/o difficulty. Next Depo due 10/8 pt agrees.

## 2016-11-23 ENCOUNTER — Other Ambulatory Visit: Payer: Self-pay | Admitting: Obstetrics

## 2016-11-23 ENCOUNTER — Other Ambulatory Visit: Payer: Self-pay

## 2016-11-23 DIAGNOSIS — Z3042 Encounter for surveillance of injectable contraceptive: Secondary | ICD-10-CM

## 2016-11-23 MED ORDER — MEDROXYPROGESTERONE ACETATE 150 MG/ML IM SUSP: 150.0000 mg | Freq: Once | INTRAMUSCULAR | 0 refills | Status: DC

## 2016-11-23 NOTE — Telephone Encounter (Signed)
Please review for refill.  

## 2016-11-24 ENCOUNTER — Ambulatory Visit (INDEPENDENT_AMBULATORY_CARE_PROVIDER_SITE_OTHER): Payer: 59

## 2016-11-24 DIAGNOSIS — Z3042 Encounter for surveillance of injectable contraceptive: Secondary | ICD-10-CM | POA: Diagnosis not present

## 2016-11-24 NOTE — Progress Notes (Signed)
Patient is in the office for depo injection, administered and pt tolerated well .. Administrations This Visit    medroxyPROGESTERone (DEPO-PROVERA) injection 150 mg    Admin Date 11/24/2016 Action Given Dose 150 mg Route Intramuscular Administered By Cass Vandermeulen D, RN         

## 2016-11-25 ENCOUNTER — Other Ambulatory Visit: Payer: Self-pay

## 2016-11-25 DIAGNOSIS — Z3042 Encounter for surveillance of injectable contraceptive: Secondary | ICD-10-CM

## 2016-11-25 MED ORDER — MEDROXYPROGESTERONE ACETATE 150 MG/ML IM SUSP: 150.0000 mg | Freq: Once | INTRAMUSCULAR | 1 refills | Status: DC

## 2017-02-16 ENCOUNTER — Ambulatory Visit: Payer: 59

## 2017-02-17 ENCOUNTER — Telehealth: Payer: Self-pay | Admitting: *Deleted

## 2017-02-17 NOTE — Telephone Encounter (Signed)
Lft msg for patient to call back and reschedule or let us know her plans. 

## 2017-02-19 ENCOUNTER — Other Ambulatory Visit: Payer: Self-pay | Admitting: Obstetrics

## 2017-02-19 DIAGNOSIS — Z3042 Encounter for surveillance of injectable contraceptive: Secondary | ICD-10-CM

## 2017-02-22 ENCOUNTER — Ambulatory Visit (INDEPENDENT_AMBULATORY_CARE_PROVIDER_SITE_OTHER): Payer: 59

## 2017-02-22 DIAGNOSIS — Z3042 Encounter for surveillance of injectable contraceptive: Secondary | ICD-10-CM

## 2017-02-22 MED ORDER — MEDROXYPROGESTERONE ACETATE 150 MG/ML IM SUSP
150.0000 mg | Freq: Once | INTRAMUSCULAR | Status: AC
  Administered 2017-02-22: 150 mg via INTRAMUSCULAR

## 2017-02-22 NOTE — Progress Notes (Signed)
Pt present for nurse Depo injection Pt supplied  Left Deltoid w/no problems Next Depo Due: 05/10/2017 -  05/24/2017 Pt made aware.

## 2017-02-22 NOTE — Progress Notes (Signed)
Agree with nursing staff's documentation of this patient's clinic encounter.  Kathleen Powell, CNM 02/22/2017 10:15 AM

## 2017-05-17 ENCOUNTER — Ambulatory Visit (INDEPENDENT_AMBULATORY_CARE_PROVIDER_SITE_OTHER): Payer: 59

## 2017-05-17 DIAGNOSIS — Z3042 Encounter for surveillance of injectable contraceptive: Secondary | ICD-10-CM

## 2017-05-17 MED ORDER — MEDROXYPROGESTERONE ACETATE 150 MG/ML IM SUSP
150.0000 mg | Freq: Once | INTRAMUSCULAR | Status: AC
  Administered 2017-05-17: 150 mg via INTRAMUSCULAR

## 2017-05-17 NOTE — Progress Notes (Signed)
Last Depo : 02/22/17 Next Depo ZOX:WRUEue:June 18-Jul 2nd  Pt made aware  Rt Deltoid Pt supplied

## 2017-05-18 NOTE — Progress Notes (Signed)
I have reviewed this chart and agree with the RN/CMA assessment and management.    Albertha Beattie C Lianni Kanaan, MD, FACOG Attending Physician, [facility]  

## 2017-08-09 ENCOUNTER — Ambulatory Visit: Payer: 59

## 2017-08-11 ENCOUNTER — Ambulatory Visit (INDEPENDENT_AMBULATORY_CARE_PROVIDER_SITE_OTHER): Payer: 59 | Admitting: *Deleted

## 2017-08-11 VITALS — Wt 145.0 lb

## 2017-08-11 DIAGNOSIS — Z3042 Encounter for surveillance of injectable contraceptive: Secondary | ICD-10-CM

## 2017-08-11 NOTE — Progress Notes (Signed)
Pt is in office for depo injection.  Pt supplied depo for today's visit.  Pt tolerated injection well.  Pt made aware she will need AEX prior to next injection. Pt advise to RTO on 11/02/17 for next depo.  Pt has no other concerns today.  Wt 145 lb (65.8 kg)   BMI 25.69 kg/m    Administrations This Visit    medroxyPROGESTERone (DEPO-PROVERA) injection 150 mg    Admin Date 08/11/2017 Action Given Dose 150 mg Route Intramuscular Administered By Lanney GinsFoster, Jobeth Pangilinan D, CMA

## 2017-08-11 NOTE — Progress Notes (Signed)
I have reviewed the chart and agree with nursing staff's documentation of this patient's encounter.  Roe CoombsRachelle A Lavinia Mcneely, CNM 08/11/2017 4:53 PM

## 2017-08-16 ENCOUNTER — Ambulatory Visit: Payer: 59

## 2017-09-05 ENCOUNTER — Encounter: Payer: Self-pay | Admitting: Obstetrics

## 2017-09-05 ENCOUNTER — Ambulatory Visit (INDEPENDENT_AMBULATORY_CARE_PROVIDER_SITE_OTHER): Payer: 59 | Admitting: Obstetrics

## 2017-09-05 VITALS — BP 130/74 | HR 95 | Ht 63.0 in | Wt 147.8 lb

## 2017-09-05 DIAGNOSIS — N946 Dysmenorrhea, unspecified: Secondary | ICD-10-CM

## 2017-09-05 DIAGNOSIS — Z124 Encounter for screening for malignant neoplasm of cervix: Secondary | ICD-10-CM | POA: Diagnosis not present

## 2017-09-05 DIAGNOSIS — Z01419 Encounter for gynecological examination (general) (routine) without abnormal findings: Secondary | ICD-10-CM

## 2017-09-05 DIAGNOSIS — G44211 Episodic tension-type headache, intractable: Secondary | ICD-10-CM

## 2017-09-05 DIAGNOSIS — Z3042 Encounter for surveillance of injectable contraceptive: Secondary | ICD-10-CM

## 2017-09-05 MED ORDER — IBUPROFEN 800 MG PO TABS: 800.0000 mg | ORAL_TABLET | Freq: Three times a day (TID) | ORAL | 5 refills | Status: DC | PRN

## 2017-09-05 MED ORDER — MEDROXYPROGESTERONE ACETATE 150 MG/ML IM SUSY: 1.0000 mL | PREFILLED_SYRINGE | INTRAMUSCULAR | 4 refills | Status: DC

## 2017-09-05 NOTE — Progress Notes (Signed)
Patient is in the office for annual, last pap 06-09-17.  Pt declines std testing.

## 2017-09-05 NOTE — Progress Notes (Signed)
Subjective:        Kathleen Powell is a 32 y.o. female here for a routine exam.  Current complaints: Headaches off and on.  Some of the headaches are like migraines, with visual changes.  The HA's are relieved with Ibuprofen.  Personal health questionnaire:  Is patient [name], have a family history of breast and/or ovarian cancer: no Is there a family history of uterine cancer diagnosed at age < 75, gastrointestinal cancer, urinary tract cancer, family member who is a Personnel officer syndrome-associated carrier: no Is the patient overweight and hypertensive, family history of diabetes, personal history of gestational diabetes, preeclampsia or PCOS: no Is patient over 17, have PCOS,  family history of premature CHD under age 25, diabetes, smoke, have hypertension or peripheral artery disease:  no At any time, has a partner hit, kicked or otherwise hurt or frightened you?: no Over the past 2 weeks, have you felt down, depressed or hopeless?: no Over the past 2 weeks, have you felt little interest or pleasure in doing things?:no   Gynecologic History No LMP recorded. Patient has had an injection. Contraception: Depo-Provera injections Last Pap: 2018. Results were: normal Last mammogram: n/a. Results were: n/a  Obstetric History OB History  Gravida Para Term Preterm AB Living  1 1 1     1   SAB TAB Ectopic Multiple Live Births          1    # Outcome Date GA Lbr Len/2nd Weight Sex Delivery Anes PTL Lv  1 Term 11/27/11 [redacted]w[redacted]d  6 lb 3.3 oz (2.815 kg) M CS-LTranv EPI  LIV    Past Medical History:  Diagnosis Date  . No pertinent past medical history   . Vertigo     Past Surgical History:  Procedure Laterality Date  . CESAREAN SECTION  11/27/2011   Procedure: CESAREAN SECTION;  Surgeon: Kathreen Cosier, MD;  Location: [facility];  Service: Obstetrics;  Laterality: N/A;  Primary cesarean section with delivery of baby boy at 61. Apgars 8/9.  Marland Kitchen NO PAST SURGERIES       Current  Outpatient Medications:  .  medroxyPROGESTERone Acetate 150 MG/ML SUSY, Inject 1 mL (150 mg total) into the muscle every 3 (three) months., Disp: 1 mL, Rfl: 4 .  cetirizine (ZYRTEC) 10 MG tablet, Take 10 mg by mouth daily., Disp: , Rfl:  .  ibuprofen (ADVIL,MOTRIN) 800 MG tablet, Take 1 tablet (800 mg total) by mouth every 8 (eight) hours as needed., Disp: 30 tablet, Rfl: 5 .  meclizine (ANTIVERT) 12.5 MG tablet, Take 1 tablet (12.5 mg total) by mouth 3 (three) times daily as needed for dizziness. (Patient not taking: Reported on 06/09/2016), Disp: 30 tablet, Rfl: 0 .  medroxyPROGESTERone (DEPO-PROVERA) 150 MG/ML injection, Inject 1 mL (150 mg total) into the muscle once., Disp: 1 mL, Rfl: 1 .  medroxyPROGESTERone (DEPO-PROVERA) 150 MG/ML injection, INJECT 1 ML (150 MG TOTAL) INTO THE MUSCLE ONCE., Disp: 1 mL, Rfl: 0  Current Facility-Administered Medications:  .  medroxyPROGESTERone (DEPO-PROVERA) injection 150 mg, 150 mg, Intramuscular, Q90 days, Antionette Char, MD, 150 mg at 08/11/17 1559 No Known Allergies  Social History   Tobacco Use  . Smoking status: Current Some Day Smoker    Packs/day: 0.25    Last attempt to quit: 09/09/2011    Years since quitting: 5.9  . Smokeless tobacco: Never Used  Substance Use Topics  . Alcohol use: No    Alcohol/week: 0.0 oz    Family History  Problem  Relation Age of Onset  . Hypertension Mother   . Cancer Mother   . Heart disease Father        Visual merchandiser  . Heart disease Maternal Grandmother        congestive heart failure  . Hypertension Maternal Grandmother   . Heart attack Maternal Grandmother   . Asthma Sister   . Gout Maternal Grandfather   . Anesthesia problems Neg Hx       Review of Systems  Constitutional: negative for fatigue and weight loss Respiratory: negative for cough and wheezing Cardiovascular: negative for chest pain, fatigue and palpitations Gastrointestinal: negative for abdominal pain and change in bowel  habits Musculoskeletal:negative for myalgias Neurological: negative for gait problems and tremors Behavioral/Psych: negative for abusive relationship, depression Endocrine: negative for temperature intolerance    Genitourinary:negative for abnormal menstrual periods, genital lesions, hot flashes, sexual problems and vaginal discharge Integument/breast: negative for breast lump, breast tenderness, nipple discharge and skin lesion(s)    Objective:       BP 130/74   Pulse 95   Ht 5\' 3"  (1.6 m)   Wt 147 lb 12.8 oz (67 kg)   BMI 26.18 kg/m  General:   alert  Skin:   no rash or abnormalities  Lungs:   clear to auscultation bilaterally  Heart:   regular rate and rhythm, S1, S2 normal, no murmur, click, rub or gallop  Breasts:   normal without suspicious masses, skin or nipple changes or axillary nodes  Abdomen:  normal findings: no organomegaly, soft, non-tender and no hernia  Pelvis:  External genitalia: normal general appearance Urinary system: urethral meatus normal and bladder without fullness, nontender Vaginal: normal without tenderness, induration or masses Cervix: normal appearance Adnexa: normal bimanual exam Uterus: anteverted and non-tender, normal size   Lab Review Urine pregnancy test Labs reviewed yes Radiologic studies reviewed no  50% of 20 min visit spent on counseling and coordination of care.   Assessment:     1. Encounter for routine gynecological examination with Papanicolaou smear of cervix Rx: - Cytology - PAP  2. Dysmenorrhea Rx: - ibuprofen (ADVIL,MOTRIN) 800 MG tablet; Take 1 tablet (800 mg total) by mouth every 8 (eight) hours as needed.  Dispense: 30 tablet; Refill: 5  3. Encounter for surveillance of injectable contraceptive Rx: - medroxyPROGESTERone Acetate 150 MG/ML SUSY; Inject 1 mL (150 mg total) into the muscle every 3 (three) months.  Dispense: 1 mL; Refill: 4  4. Intractable episodic tension-type headache Rx: - Ambulatory referral to  Neurology    Plan:    Education reviewed: calcium supplements, depression evaluation, low fat, low cholesterol diet, safe sex/STD prevention, self breast exams, smoking cessation and weight bearing exercise. Contraception: Depo-Provera injections. Follow up in: 1 year.   Meds ordered this encounter  Medications  . medroxyPROGESTERone Acetate 150 MG/ML SUSY    Sig: Inject 1 mL (150 mg total) into the muscle every 3 (three) months.    Dispense:  1 mL    Refill:  4  . ibuprofen (ADVIL,MOTRIN) 800 MG tablet    Sig: Take 1 tablet (800 mg total) by mouth every 8 (eight) hours as needed.    Dispense:  30 tablet    Refill:  5   Orders Placed This Encounter  Procedures  . Ambulatory referral to Neurology    Referral Priority:   Routine    Referral Type:   Consultation    Referral Reason:   Specialty Services Required    Requested Specialty:  Neurology    Number of Visits Requested:   1     Brock BadHARLES A. Fergie Sherbert MD 09-05-2017

## 2017-09-07 LAB — CYTOLOGY - PAP: DIAGNOSIS: NEGATIVE

## 2017-11-02 ENCOUNTER — Ambulatory Visit (INDEPENDENT_AMBULATORY_CARE_PROVIDER_SITE_OTHER): Payer: 59

## 2017-11-02 VITALS — BP 122/80 | HR 91 | Wt 145.6 lb

## 2017-11-02 DIAGNOSIS — Z3042 Encounter for surveillance of injectable contraceptive: Secondary | ICD-10-CM | POA: Diagnosis not present

## 2017-11-02 MED ORDER — MEDROXYPROGESTERONE ACETATE 150 MG/ML IM SUSP
150.0000 mg | Freq: Once | INTRAMUSCULAR | Status: AC
  Administered 2017-11-02: 150 mg via INTRAMUSCULAR

## 2017-11-02 NOTE — Progress Notes (Signed)
Presents for DEPO, given in RD, tolerated Well.  Next DEPO 12/4-18/2019  Administrations This Visit    medroxyPROGESTERone (DEPO-PROVERA) injection 150 mg    Admin Date 11/02/2017 Action Given Dose 150 mg Route Intramuscular Administered By Maretta BeesMcGlashan, Dvontae Ruan J, RMA

## 2017-11-02 NOTE — Addendum Note (Signed)
Addended by: Maretta BeesMCGLASHAN, Renlee Floor J on: 11/02/2017 04:13 PM   Modules accepted: Level of Service

## 2017-11-08 ENCOUNTER — Ambulatory Visit: Payer: 59 | Admitting: Neurology

## 2017-12-29 ENCOUNTER — Ambulatory Visit: Payer: 59 | Admitting: Neurology

## 2017-12-29 ENCOUNTER — Encounter: Payer: Self-pay | Admitting: Neurology

## 2017-12-29 VITALS — BP 136/82 | HR 82 | Ht 63.0 in | Wt 145.0 lb

## 2017-12-29 DIAGNOSIS — R51 Headache with orthostatic component, not elsewhere classified: Secondary | ICD-10-CM

## 2017-12-29 DIAGNOSIS — R519 Headache, unspecified: Secondary | ICD-10-CM

## 2017-12-29 DIAGNOSIS — R42 Dizziness and giddiness: Secondary | ICD-10-CM

## 2017-12-29 DIAGNOSIS — G8929 Other chronic pain: Secondary | ICD-10-CM

## 2017-12-29 DIAGNOSIS — G43109 Migraine with aura, not intractable, without status migrainosus: Secondary | ICD-10-CM | POA: Diagnosis not present

## 2017-12-29 DIAGNOSIS — G43709 Chronic migraine without aura, not intractable, without status migrainosus: Secondary | ICD-10-CM

## 2017-12-29 DIAGNOSIS — H539 Unspecified visual disturbance: Secondary | ICD-10-CM

## 2017-12-29 MED ORDER — PROPRANOLOL HCL ER 120 MG PO CP24: 120.0000 mg | ORAL_CAPSULE | Freq: Every day | ORAL | 11 refills | Status: DC

## 2017-12-29 MED ORDER — RIZATRIPTAN BENZOATE 10 MG PO TBDP: 10.0000 mg | ORAL_TABLET | ORAL | 11 refills | Status: DC | PRN

## 2017-12-29 NOTE — Progress Notes (Signed)
[facility]    Provider:  Dr Lucia Gaskins Referring Provider: Brock Bad, MD Primary Care Physician:  Brock Bad, MD  CC:  headache  HPI:  Kathleen Powell is a 32 y.o. female here as requested by Dr. Clearance Coots for headaches. Headaches started at the age of 68. Worsening this year in the setting of stress. Can be unilateral but anywhere, behind the eyes, throbbing, severe, can't move, feels dizzy, +photophobia. A dark quiet room helps. Can last 24-72 hours. 25 headaches days a month for > 6 months, 20 are migrainous 3 days are severe and the moderately severe. She can have some spots in the vision during the headache but not always. She wakes with headaches. Headaches are positional. Sleep is a trigger too much, stress can make it worse. She has vision changes. Also experiences vertigo, room spinning as well as dizziness/imbalance. No other focal neurologic deficits, associated symptoms, inciting events or modifiable factors.  Reviewed notes, labs and imaging from outside physicians, which showed:  Patient reports she has recent routine labwork done (cmp,cbc,tsh) will request   Review of Systems: Patient complains of symptoms per HPI as well as the following symptoms: headaches. Pertinent negatives and positives per HPI. All others negative.   Social History   Socioeconomic History  . Marital status: Single    Spouse name: Not on file  . Number of children: Not on file  . Years of education: Not on file  . Highest education level: Some college, no degree  Occupational History  . Not on file  Social Needs  . Financial resource strain: Not on file  . Food insecurity:    Worry: Not on file    Inability: Not on file  . Transportation needs:    Medical: Not on file    Non-medical: Not on file  Tobacco Use  . Smoking status: Current Every Day Smoker    Packs/day: 0.25    Types: Cigarettes    Last attempt to quit: 09/09/2011    Years since quitting: 6.3  .  Smokeless tobacco: Never Used  Substance and Sexual Activity  . Alcohol use: No    Alcohol/week: 0.0 standard drinks  . Drug use: No  . Sexual activity: Yes    Birth control/protection: Injection  Lifestyle  . Physical activity:    Days per week: Not on file    Minutes per session: Not on file  . Stress: Not on file  Relationships  . Social connections:    Talks on phone: Not on file    Gets together: Not on file    Attends religious service: Not on file    Active member of club or organization: Not on file    Attends meetings of clubs or organizations: Not on file    Relationship status: Not on file  . Intimate partner violence:    Fear of current or ex partner: Not on file    Emotionally abused: Not on file    Physically abused: Not on file    Forced sexual activity: Not on file  Other Topics Concern  . Not on file  Social History Narrative   Lives at home with her mother & son   Right handed   Caffeine: 3 cups a week     Family History  Problem Relation Age of Onset  . Hypertension Mother   . Cancer Mother        thyroid  . Heart disease Father  pace [employer]  . Heart disease Maternal Grandmother        congestive heart failure  . Hypertension Maternal Grandmother   . Heart attack Maternal Grandmother   . Asthma Sister   . Gout Maternal Grandfather   . Anesthesia problems Neg Hx     Past Medical History:  Diagnosis Date  . Migraine   . No pertinent past medical history   . Vertigo     Past Surgical History:  Procedure Laterality Date  . CESAREAN SECTION  11/27/2011   Procedure: CESAREAN SECTION;  Surgeon: Kathreen CosierBernard A Marshall, MD;  Location: [facility];  Service: Obstetrics;  Laterality: N/A;  Primary cesarean section with delivery of baby boy at [id number]. Apgars 8/9.    Current Outpatient Medications  Medication Sig Dispense Refill  . ibuprofen (ADVIL,MOTRIN) 800 MG tablet Take 1 tablet (800 mg total) by mouth every 8 (eight) hours as needed. 30 tablet 5    . medroxyPROGESTERone Acetate 150 MG/ML SUSY Inject 1 mL (150 mg total) into the muscle every 3 (three) months. 1 mL 4  . meclizine (ANTIVERT) 12.5 MG tablet Take 1 tablet (12.5 mg total) by mouth 3 (three) times daily as needed for dizziness. (Patient not taking: Reported on 06/09/2016) 30 tablet 0  . propranolol ER (INDERAL LA) 120 MG 24 hr capsule Take 1 capsule (120 mg total) by mouth daily. 30 capsule 11  . rizatriptan (MAXALT-MLT) 10 MG disintegrating tablet Take 1 tablet (10 mg total) by mouth as needed for migraine. May repeat in 2 hours if needed 9 tablet 11   Current Facility-Administered Medications  Medication Dose Route Frequency Provider Last Rate Last Dose  . medroxyPROGESTERone (DEPO-PROVERA) injection 150 mg  150 mg Intramuscular Q90 days Antionette CharJackson-Moore, Lisa, MD   150 mg at 08/11/17 1559    Allergies as of 12/29/2017  . (No Known Allergies)    Vitals: BP 136/82 (BP Location: Right Arm, Patient Position: Sitting)   Pulse 82   Ht 5\' 3"  (1.6 m)   Wt 145 lb (65.8 kg)   BMI 25.69 kg/m  Last Weight:  Wt Readings from Last 1 Encounters:  12/29/17 145 lb (65.8 kg)   Last Height:   Ht Readings from Last 1 Encounters:  12/29/17 5\' 3"  (1.6 m)   Physical exam: Exam: Gen: NAD, conversant, well nourised, well groomed                     CV: RRR, no MRG. No Carotid Bruits. No peripheral edema, warm, nontender Eyes: Conjunctivae clear without exudates or hemorrhage  Neuro: Detailed Neurologic Exam  Speech:    Speech is normal; fluent and spontaneous with normal comprehension.  Cognition:    The patient is oriented to person, place, and time;     recent and remote memory intact;     language fluent;     normal attention, concentration,     fund of knowledge Cranial Nerves:    The pupils are equal, round, and reactive to light. The fundi are normal and spontaneous venous pulsations are present. Visual fields are full to finger confrontation. Extraocular movements are  intact. Trigeminal sensation is intact and the muscles of mastication are normal. The face is symmetric. The palate elevates in the midline. Hearing intact. Voice is normal. Shoulder shrug is normal. The tongue has normal motion without fasciculations.   Coordination:    Normal finger to nose and heel to shin. Normal rapid alternating movements.   Gait:    Heel-toe  and tandem gait are normal.   Motor Observation:    No asymmetry, no atrophy, and no involuntary movements noted. Tone:    Normal muscle tone.    Posture:    Posture is normal. normal erect    Strength:    Strength is V/V in the upper and lower limbs.      Sensation: intact to LT     Reflex Exam:  DTR's:    Deep tendon reflexes in the upper and lower extremities are normal bilaterally.   Toes:    The toes are downgoing bilaterally.   Clonus:    Clonus is absent.       Assessment/Plan:  Patient with chronic migraines  MRI of the brain w/wo contrast: MRI brain due to concerning symptoms of morning headaches,vertigo, positional headaches,vision changes  to look for space occupying mass, chiari or intracranial hypertension (pseudotumor) or other  Preventative: Propranolol Acute: Rizatriptan. Please take one tablet at the onset of your headache. If it does not improve the symptoms please take one additional tablet in 2 hours. Do not take more then 2 tablets in 24hrs. Do not take use more then 2 to 3 times in a week.  Meds ordered this encounter  Medications  . propranolol ER (INDERAL LA) 120 MG 24 hr capsule    Sig: Take 1 capsule (120 mg total) by mouth daily.    Dispense:  30 capsule    Refill:  11  . rizatriptan (MAXALT-MLT) 10 MG disintegrating tablet    Sig: Take 1 tablet (10 mg total) by mouth as needed for migraine. May repeat in 2 hours if needed    Dispense:  9 tablet    Refill:  11   Orders Placed This Encounter  Procedures  . MR BRAIN W WO CONTRAST     Discussed: There is increased risk for  stroke in women with migraine with aura and a  Contraindication for the combined contraceptive pill for use by women who have migraine with aura, which is in line with World Health Organisation recommendations. The risk for women with migraine without aura is lower and other risk factors like smoking are far more likely to increase stroke risk than migraine. There is a recommendation for no smoking and for the use of low estrogen or progestogen only pills particularly for women with migraine with aura. It is important however that women with migraine who are taking the pill do not decide to suddenly stop taking it without discussing this with their doctor. Please discuss with her OB/GYN.  To prevent or relieve headaches, try the following: Cool Compress. Lie down and place a cool compress on your head.  Avoid headache triggers. If certain foods or odors seem to have triggered your migraines in the past, avoid them. A headache diary might help you identify triggers.  Include physical activity in your daily routine. Try a daily walk or other moderate aerobic exercise.  Manage stress. Find healthy ways to cope with the stressors, such as delegating tasks on your to-do list.  Practice relaxation techniques. Try deep breathing, yoga, massage and visualization.  Eat regularly. Eating regularly scheduled meals and maintaining a healthy diet might help prevent headaches. Also, drink plenty of fluids.  Follow a regular sleep schedule. Sleep deprivation might contribute to headaches Consider biofeedback. With this mind-body technique, you learn to control certain bodily functions - such as muscle tension, heart rate and blood pressure - to prevent headaches or reduce headache pain.    Proceed  to emergency room if you experience new or worsening symptoms or symptoms do not resolve, if you have new neurologic symptoms or if headache is severe, or for any concerning symptom.   Provided education and documentation  from American headache Society toolbox including articles on: chronic migraine medication overuse headache, chronic migraines, prevention of migraines, behavioral and other nonpharmacologic treatments for headache.     Cc: Dr. Harless Nakayama, MD  [facility] [address] [place], [place] [address]  Phone [phone] Fax [phone]  A total of 60 minutes was spent face-to-face with this patient. Over half this time was spent on counseling patient on the  1. Chronic migraine without aura without status migrainosus, not intractable   2. Migraine with aura and without status migrainosus, not intractable   3. Chronic nonintractable headache, unspecified headache type   4. Morning headache   5. Positional headache   6. Vertigo   7. Vision changes     diagnosis and different diagnostic and therapeutic options, counseling and coordination of care, risks ans benefits of management, compliance, or risk factor reduction and education.

## 2017-12-29 NOTE — Patient Instructions (Addendum)
Preventative: Propranolol Acute: Rizatriptan. Please take one tablet at the onset of your headache. If it does not improve the symptoms please take one additional tablet in 2 hours. Do not take more then 2 tablets in 24hrs. Do not take use more then 2 to 3 times in a week. MRI of the brain (we will call you, ask what your $$ amount will be!)  There is increased risk for stroke in women with migraine with aura and a  Contraindication for the combined contraceptive pill for use by women who have migraine with aura, which is in line with World Health Organisation recommendations. The risk for women with migraine without aura is lower and other risk factors like smoking are far more likely to increase stroke risk than migraine. There is a recommendation for no smoking and for the use of low estrogen or progestogen only pills particularly for women with migraine with aura. It is important however that women with migraine who are taking the pill do not decide to suddenly stop taking it without discussing this with their doctor. Please discuss with her OB/GYN.  Rizatriptan disintegrating tablets What is this medicine? RIZATRIPTAN (rye za TRIP tan) is used to treat migraines with or without aura. An aura is a strange feeling or visual disturbance that warns you of an attack. It is not used to prevent migraines. This medicine may be used for other purposes; ask your health care provider or pharmacist if you have questions. COMMON BRAND NAME(S): Maxalt-MLT What should I tell my health care provider before I take this medicine? They need to know if you have any of these conditions: -bowel disease or colitis -diabetes -family history of heart disease -fast or irregular heart beat -heart or blood vessel disease, angina (chest pain), or previous heart attack -high blood pressure -high cholesterol -history of stroke, transient ischemic attacks (TIAs or mini-strokes), or intracranial bleeding -kidney or liver  disease -overweight -poor circulation -postmenopausal or surgical removal of uterus and ovaries -an unusual or allergic reaction to rizatriptan, other medicines, foods, dyes, or preservatives -pregnant or trying to get pregnant -breast-feeding How should I use this medicine? Take this medicine by mouth. Follow the directions on the prescription label. This medicine is taken at the first symptoms of a migraine. It is not for everyday use. Leave the tablet in the foil package until you are ready to take it. Do not push the tablet through the blister pack. Peel open the blister pack with dry hands and place the tablet on your tongue. The tablet will dissolve rapidly and be swallowed in your saliva. It is not necessary to drink any water to take this medicine. If your migraine headache returns after one dose, you can take another dose as directed. You must leave at least 2 hours between doses, and do not take more than 30 mg total in 24 hours. If there is no improvement at all after the first dose, do not take a second dose without talking to your doctor or health care professional. Do not take your medicine more often than directed. Talk to your pediatrician regarding the use of this medicine in children. While this drug may be prescribed for children as young as 6 years for selected conditions, precautions do apply. Overdosage: If you think you have taken too much of this medicine contact a poison control center or emergency room at once. NOTE: This medicine is only for you. Do not share this medicine with others. What if I miss a dose?  This does not apply; this medicine is not for regular use. What may interact with this medicine? Do not take this medicine with any of the following medicines: -amphetamine, dextroamphetamine or cocaine -dihydroergotamine, ergotamine, ergoloid mesylates, methysergide, or ergot-type medication - do not take within 24 hours of taking rizatriptan -feverfew -MAOIs like  Carbex, Eldepryl, Marplan, Nardil, and Parnate - do not take rizatriptan within 2 weeks of stopping MAOI therapy. -other migraine medicines like almotriptan, eletriptan, naratriptan, sumatriptan, zolmitriptan - do not take within 24 hours of taking rizatriptan -tryptophan This medicine may also interact with the following medications: -medicines for mental depression, anxiety or mood problems -propranolol This list may not describe all possible interactions. Give your health care provider a list of all the medicines, herbs, non-prescription drugs, or dietary supplements you use. Also tell them if you smoke, drink alcohol, or use illegal drugs. Some items may interact with your medicine. What should I watch for while using this medicine? Only take this medicine for a migraine headache. Take it if you get warning symptoms or at the start of a migraine attack. It is not for regular use to prevent migraine attacks. You may get drowsy or dizzy. Do not drive, use machinery, or do anything that needs mental alertness until you know how this medicine affects you. To reduce dizzy or fainting spells, do not sit or stand up quickly, especially if you are an older patient. Alcohol can increase drowsiness, dizziness and flushing. Avoid alcoholic drinks. Smoking cigarettes may increase the risk of heart-related side effects from using this medicine. If you take migraine medicines for 10 or more days a month, your migraines may get worse. Keep a diary of headache days and medicine use. Contact your healthcare professional if your migraine attacks occur more frequently. What side effects may I notice from receiving this medicine? Side effects that you should report to your doctor or health care professional as soon as possible: -allergic reactions like skin rash, itching or hives, swelling of the face, lips, or tongue -fast, slow, or irregular heart beat -increased or decreased blood pressure -seizures -severe  stomach pain and cramping, bloody diarrhea -signs and symptoms of a blood clot such as breathing problems; changes in vision; chest pain; severe, sudden headache; pain, swelling, warmth in the leg; trouble speaking; sudden numbness or weakness of the face, arm or leg -tingling, pain, or numbness in the face, hands, or feet Side effects that usually do not require medical attention (report to your doctor or health care professional if they continue or are bothersome): -drowsiness -dry mouth -feeling warm, flushing, or redness of the face -headache -muscle cramps, pain -nausea, vomiting -unusually weak or tired This list may not describe all possible side effects. Call your doctor for medical advice about side effects. You may report side effects to FDA at 1-800-FDA-1088. Where should I keep my medicine? Keep out of the reach of children. Store at room temperature between 15 and 30 degrees C (59 and 86 degrees F). Protect from light and moisture. Throw away any unused medicine after the expiration date. NOTE: This sheet is a summary. It may not cover all possible information. If you have questions about this medicine, talk to your doctor, pharmacist, or health care provider.  2018 Elsevier/Gold Standard (2012-10-03 10:17:42)  Propranolol extended-release capsules What is this medicine? PROPRANOLOL (proe PRAN oh lole) is a beta-blocker. Beta-blockers reduce the workload on the heart and help it to beat more regularly. This medicine is used to treat high blood  pressure, heart muscle disease, and prevent chest pain caused by angina. It is also used to prevent migraine headaches. You should not use this medicine to treat a migraine that has already started. This medicine may be used for other purposes; ask your health care provider or pharmacist if you have questions. COMMON BRAND NAME(S): Inderal LA, Inderal XL, InnoPran XL What should I tell my health care provider before I take this  medicine? They need to know if you have any of these conditions: -circulation problems, or blood vessel disease -diabetes -history of heart attack or heart disease, vasospastic angina -kidney disease -liver disease -lung or breathing disease, like asthma or emphysema -pheochromocytoma -slow heart rate -thyroid disease -an unusual or allergic reaction to propranolol, other beta-blockers, medicines, foods, dyes, or preservatives -pregnant or trying to get pregnant -breast-feeding How should I use this medicine? Take this medicine by mouth with a glass of water. Follow the directions on the prescription label. Do not crush or chew. Take your doses at regular intervals. Do not take your medicine more often than directed. Do not stop taking except on the advice of your doctor or health care professional. Talk to your pediatrician regarding the use of this medicine in children. Special care may be needed. Overdosage: If you think you have taken too much of this medicine contact a poison control center or emergency room at once. NOTE: This medicine is only for you. Do not share this medicine with others. What if I miss a dose? If you miss a dose, take it as soon as you can. If it is almost time for your next dose, take only that dose. Do not take double or extra doses. What may interact with this medicine? Do not take this medicine with any of the following medications: -feverfew -phenothiazines like chlorpromazine, mesoridazine, prochlorperazine, thioridazine This medicine may also interact with the following medications: -aluminum hydroxide gel -antipyrine -antiviral medicines for HIV or AIDS -barbiturates like phenobarbital -certain medicines for blood pressure, heart disease, irregular heart beat -cimetidine -ciprofloxacin -diazepam -fluconazole -haloperidol -isoniazid -medicines for cholesterol like cholestyramine or colestipol -medicines for mental depression -medicines for  migraine headache like almotriptan, eletriptan, frovatriptan, naratriptan, rizatriptan, sumatriptan, zolmitriptan -NSAIDs, medicines for pain and inflammation, like ibuprofen or naproxen -phenytoin -rifampin -teniposide -theophylline -thyroid medicines -tolbutamide -warfarin -zileuton This list may not describe all possible interactions. Give your health care provider a list of all the medicines, herbs, non-prescription drugs, or dietary supplements you use. Also tell them if you smoke, drink alcohol, or use illegal drugs. Some items may interact with your medicine. What should I watch for while using this medicine? Visit your doctor or health care professional for regular check ups. Contact your doctor right away if your symptoms worsen. Check your blood pressure and pulse rate regularly. Ask your health care professional what your blood pressure and pulse rate should be, and when you should contact them. Do not stop taking this medicine suddenly. This could lead to serious heart-related effects. You may get drowsy or dizzy. Do not drive, use machinery, or do anything that needs mental alertness until you know how this drug affects you. Do not stand or sit up quickly, especially if you are an older patient. This reduces the risk of dizzy or fainting spells. Alcohol can make you more drowsy and dizzy. Avoid alcoholic drinks. This medicine can affect blood sugar levels. If you have diabetes, check with your doctor or health care professional before you change your diet or the dose of  your diabetic medicine. Do not treat yourself for coughs, colds, or pain while you are taking this medicine without asking your doctor or health care professional for advice. Some ingredients may increase your blood pressure. What side effects may I notice from receiving this medicine? Side effects that you should report to your doctor or health care professional as soon as possible: -allergic reactions like skin rash,  itching or hives, swelling of the face, lips, or tongue -breathing problems -changes in blood sugar -cold hands or feet -difficulty sleeping, nightmares -dry peeling skin -hallucinations -muscle cramps or weakness -slow heart rate -swelling of the legs and ankles -vomiting Side effects that usually do not require medical attention (report to your doctor or health care professional if they continue or are bothersome): -change in sex drive or performance -diarrhea -dry sore eyes -hair loss -nausea -weak or tired This list may not describe all possible side effects. Call your doctor for medical advice about side effects. You may report side effects to FDA at 1-800-FDA-1088. Where should I keep my medicine? Keep out of the reach of children. Store at room temperature between 15 and 30 degrees C (59 and 86 degrees F). Protect from light, moisture and freezing. Keep container tightly closed. Throw away any unused medicine after the expiration date. NOTE: This sheet is a summary. It may not cover all possible information. If you have questions about this medicine, talk to your doctor, pharmacist, or health care provider.  2018 Elsevier/Gold Standard (2012-10-06 14:58:56)  Migraine Headache A migraine headache is a very strong throbbing pain on one side or both sides of your head. Migraines can also cause other symptoms. Talk with your doctor about what things may bring on (trigger) your migraine headaches. Follow these instructions at home: Medicines  Take over-the-counter and prescription medicines only as told by your doctor.  Do not drive or use heavy machinery while taking prescription pain medicine.  To prevent or treat constipation while you are taking prescription pain medicine, your doctor may recommend that you: ? Drink enough fluid to keep your pee (urine) clear or pale yellow. ? Take over-the-counter or prescription medicines. ? Eat foods that are high in fiber. These  include fresh fruits and vegetables, whole grains, and beans. ? Limit foods that are high in fat and processed sugars. These include fried and sweet foods. Lifestyle  Avoid alcohol.  Do not use any products that contain nicotine or tobacco, such as cigarettes and e-cigarettes. If you need help quitting, ask your doctor.  Get at least 8 hours of sleep every night.  Limit your stress. General instructions   Keep a journal to find out what may bring on your migraines. For example, write down: ? What you eat and drink. ? How much sleep you get. ? Any change in what you eat or drink. ? Any change in your medicines.  If you have a migraine: ? Avoid things that make your symptoms worse, such as bright lights. ? It may help to lie down in a dark, quiet room. ? Do not drive or use heavy machinery. ? Ask your doctor what activities are safe for you.  Keep all follow-up visits as told by your doctor. This is important. Contact a doctor if:  You get a migraine that is different or worse than your usual migraines. Get help right away if:  Your migraine gets very bad.  You have a fever.  You have a stiff neck.  You have trouble seeing.  Your  muscles feel weak or like you cannot control them.  You start to lose your balance a lot.  You start to have trouble walking.  You pass out (faint). This information is not intended to replace advice given to you by your health care provider. Make sure you discuss any questions you have with your health care provider. Document Released: 11/11/2007 Document Revised: 08/22/2015 Document Reviewed: 07/21/2015 Elsevier Interactive Patient Education  2018 [employer].

## 2018-01-02 ENCOUNTER — Telehealth: Payer: Self-pay | Admitting: Neurology

## 2018-01-02 NOTE — Telephone Encounter (Signed)
lvm for pt to call back about scheduling mri.  [employer] Auth: [id number] (exp. 01/02/18 to 02/16/18)

## 2018-01-02 NOTE — Telephone Encounter (Signed)
Patient returned my call I went over the benefits and it will cost her about $1,592.91 due to she has not met her deductible I did offer the payment plan. She stated she wants to think about it and get back to me.

## 2018-01-30 ENCOUNTER — Ambulatory Visit (INDEPENDENT_AMBULATORY_CARE_PROVIDER_SITE_OTHER): Payer: 59

## 2018-01-30 VITALS — Wt 143.5 lb

## 2018-01-30 DIAGNOSIS — Z3042 Encounter for surveillance of injectable contraceptive: Secondary | ICD-10-CM | POA: Diagnosis not present

## 2018-01-30 MED ORDER — MEDROXYPROGESTERONE ACETATE 150 MG/ML IM SUSP
150.0000 mg | Freq: Once | INTRAMUSCULAR | Status: AC
  Administered 2018-01-30: 150 mg via INTRAMUSCULAR

## 2018-01-30 NOTE — Progress Notes (Signed)
Pt is here for depo injection, she is on time. Injection given in L deltoid, pt tolerated well. Pt instructed to make appt b/t 3/3 and 3/17 for next injection.

## 2018-04-25 ENCOUNTER — Ambulatory Visit (INDEPENDENT_AMBULATORY_CARE_PROVIDER_SITE_OTHER): Payer: 59 | Admitting: *Deleted

## 2018-04-25 VITALS — BP 124/82 | HR 96 | Wt 143.0 lb

## 2018-04-25 DIAGNOSIS — Z3042 Encounter for surveillance of injectable contraceptive: Secondary | ICD-10-CM

## 2018-04-25 DIAGNOSIS — Z309 Encounter for contraceptive management, unspecified: Secondary | ICD-10-CM

## 2018-04-25 NOTE — Progress Notes (Signed)
Pt is in office for Depo injection.  Pt is on time for injection. Pt tolerated Depo well, has no other concerns. Pt advised to RTO 5/26-07/25/2018 for next Depo.   BP 124/82   Pulse 96   Wt 143 lb (64.9 kg)   BMI 25.33 kg/m     Administrations This Visit    medroxyPROGESTERone (DEPO-PROVERA) injection 150 mg    Admin Date 04/25/2018 Action Given Dose 150 mg Route Intramuscular Administered By Lanney Gins, CMA

## 2018-07-18 ENCOUNTER — Ambulatory Visit (INDEPENDENT_AMBULATORY_CARE_PROVIDER_SITE_OTHER): Payer: 59

## 2018-07-18 ENCOUNTER — Other Ambulatory Visit: Payer: Self-pay

## 2018-07-18 VITALS — BP 120/83 | HR 97 | Ht 63.0 in | Wt 146.0 lb

## 2018-07-18 DIAGNOSIS — Z3042 Encounter for surveillance of injectable contraceptive: Secondary | ICD-10-CM

## 2018-07-18 MED ORDER — MEDROXYPROGESTERONE ACETATE 150 MG/ML IM SUSP
150.0000 mg | Freq: Once | INTRAMUSCULAR | Status: AC
  Administered 2018-07-18: 150 mg via INTRAMUSCULAR

## 2018-07-18 NOTE — Progress Notes (Signed)
Presented for DEPO Injection, given in LD, tolerated well.   Next DEPO August 18- Sept. 02/2018  Administrations This Visit    medroxyPROGESTERone (DEPO-PROVERA) injection 150 mg    Admin Date 07/18/2018 Action Given Dose 150 mg Route Intramuscular Administered By Maretta Bees, RMA

## 2018-10-08 ENCOUNTER — Other Ambulatory Visit: Payer: Self-pay | Admitting: Obstetrics

## 2018-10-08 DIAGNOSIS — Z3042 Encounter for surveillance of injectable contraceptive: Secondary | ICD-10-CM

## 2018-10-09 ENCOUNTER — Other Ambulatory Visit: Payer: Self-pay

## 2018-10-09 ENCOUNTER — Ambulatory Visit (INDEPENDENT_AMBULATORY_CARE_PROVIDER_SITE_OTHER): Payer: 59

## 2018-10-09 VITALS — BP 120/83 | HR 97 | Wt 147.9 lb

## 2018-10-09 DIAGNOSIS — Z3042 Encounter for surveillance of injectable contraceptive: Secondary | ICD-10-CM

## 2018-10-09 MED ORDER — MEDROXYPROGESTERONE ACETATE 150 MG/ML IM SUSP: 150.0000 mg | INTRAMUSCULAR | 1 refills | Status: DC

## 2018-10-09 MED ORDER — MEDROXYPROGESTERONE ACETATE 150 MG/ML IM SUSP
150.0000 mg | Freq: Once | INTRAMUSCULAR | Status: AC
  Administered 2018-10-09: 16:00:00 150 mg via INTRAMUSCULAR

## 2018-10-09 NOTE — Progress Notes (Signed)
Presents for DEPO Injection, given in LD, tolerated well. Patient supplied.   Next DEPO 11/9-23/2020  Administrations This Visit    medroxyPROGESTERone (DEPO-PROVERA) injection 150 mg    Admin Date 10/09/2018 Action Given Dose 150 mg Route Intramuscular Administered By Tamela Oddi, RMA

## 2018-11-01 ENCOUNTER — Ambulatory Visit (INDEPENDENT_AMBULATORY_CARE_PROVIDER_SITE_OTHER): Payer: 59 | Admitting: Obstetrics & Gynecology

## 2018-11-01 ENCOUNTER — Encounter: Payer: Self-pay | Admitting: Obstetrics & Gynecology

## 2018-11-01 ENCOUNTER — Other Ambulatory Visit: Payer: Self-pay

## 2018-11-01 VITALS — BP 120/78 | HR 89 | Ht 63.0 in | Wt 147.0 lb

## 2018-11-01 DIAGNOSIS — F17210 Nicotine dependence, cigarettes, uncomplicated: Secondary | ICD-10-CM

## 2018-11-01 DIAGNOSIS — Z01419 Encounter for gynecological examination (general) (routine) without abnormal findings: Secondary | ICD-10-CM | POA: Diagnosis not present

## 2018-11-01 DIAGNOSIS — Z3042 Encounter for surveillance of injectable contraceptive: Secondary | ICD-10-CM | POA: Insufficient documentation

## 2018-11-01 NOTE — Patient Instructions (Signed)
Contraception Choices Contraception, also called birth control, refers to methods or devices that prevent pregnancy. Hormonal methods Contraceptive implant  A contraceptive implant is a thin, plastic tube that contains a hormone. It is inserted into the upper part of the arm. It can remain in place for up to 3 years. Progestin-only injections Progestin-only injections are injections of progestin, a synthetic form of the hormone progesterone. They are given every 3 months by a health care provider. Birth control pills  Birth control pills are pills that contain hormones that prevent pregnancy. They must be taken once a day, preferably at the same time each day. Birth control patch  The birth control patch contains hormones that prevent pregnancy. It is placed on the skin and must be changed once a week for three weeks and removed on the fourth week. A prescription is needed to use this method of contraception. Vaginal ring  A vaginal ring contains hormones that prevent pregnancy. It is placed in the vagina for three weeks and removed on the fourth week. After that, the process is repeated with a new ring. A prescription is needed to use this method of contraception. Emergency contraceptive Emergency contraceptives prevent pregnancy after unprotected sex. They come in pill form and can be taken up to 5 days after sex. They work best the sooner they are taken after having sex. Most emergency contraceptives are available without a prescription. This method should not be used as your only form of birth control. Barrier methods Female condom  A female condom is a thin sheath that is worn over the penis during sex. Condoms keep sperm from going inside a woman's body. They can be used with a spermicide to increase their effectiveness. They should be disposed after a single use. Female condom  A female condom is a soft, loose-fitting sheath that is put into the vagina before sex. The condom keeps sperm  from going inside a woman's body. They should be disposed after a single use. Diaphragm  A diaphragm is a soft, dome-shaped barrier. It is inserted into the vagina before sex, along with a spermicide. The diaphragm blocks sperm from entering the uterus, and the spermicide kills sperm. A diaphragm should be left in the vagina for 6-8 hours after sex and removed within 24 hours. A diaphragm is prescribed and fitted by a health care provider. A diaphragm should be replaced every 1-2 years, after giving birth, after gaining more than 15 lb (6.8 kg), and after pelvic surgery. Cervical cap  A cervical cap is a round, soft latex or plastic cup that fits over the cervix. It is inserted into the vagina before sex, along with spermicide. It blocks sperm from entering the uterus. The cap should be left in place for 6-8 hours after sex and removed within 48 hours. A cervical cap must be prescribed and fitted by a health care provider. It should be replaced every 2 years. Sponge  A sponge is a soft, circular piece of polyurethane foam with spermicide on it. The sponge helps block sperm from entering the uterus, and the spermicide kills sperm. To use it, you make it wet and then insert it into the vagina. It should be inserted before sex, left in for at least 6 hours after sex, and removed and thrown away within 30 hours. Spermicides Spermicides are chemicals that kill or block sperm from entering the cervix and uterus. They can come as a cream, jelly, suppository, foam, or tablet. A spermicide should be inserted into the   vagina with an applicator at least 10-15 minutes before sex to allow time for it to work. The process must be repeated every time you have sex. Spermicides do not require a prescription. Intrauterine contraception Intrauterine device (IUD) An IUD is a T-shaped device that is put in a woman's uterus. There are two types:  Hormone IUD.This type contains progestin, a synthetic form of the hormone  progesterone. This type can stay in place for 3-5 years.  Copper IUD.This type is wrapped in copper wire. It can stay in place for 10 years.  Permanent methods of contraception Female tubal ligation In this method, a woman's fallopian tubes are sealed, tied, or blocked during surgery to prevent eggs from traveling to the uterus. Hysteroscopic sterilization In this method, a small, flexible insert is placed into each fallopian tube. The inserts cause scar tissue to form in the fallopian tubes and block them, so sperm cannot reach an egg. The procedure takes about 3 months to be effective. Another form of birth control must be used during those 3 months. Female sterilization This is a procedure to tie off the tubes that carry sperm (vasectomy). After the procedure, the man can still ejaculate fluid (semen). Natural planning methods Natural family planning In this method, a couple does not have sex on days when the woman could become pregnant. Calendar method This means keeping track of the length of each menstrual cycle, identifying the days when pregnancy can happen, and not having sex on those days. Ovulation method In this method, a couple avoids sex during ovulation. Symptothermal method This method involves not having sex during ovulation. The woman typically checks for ovulation by watching changes in her temperature and in the consistency of cervical mucus. Post-ovulation method In this method, a couple waits to have sex until after ovulation. Summary  Contraception, also called birth control, means methods or devices that prevent pregnancy.  Hormonal methods of contraception include implants, injections, pills, patches, vaginal rings, and emergency contraceptives.  Barrier methods of contraception can include female condoms, female condoms, diaphragms, cervical caps, sponges, and spermicides.  There are two types of IUDs (intrauterine devices). An IUD can be put in a woman's uterus to  prevent pregnancy for 3-5 years.  Permanent sterilization can be done through a procedure for males, females, or both.  Natural family planning methods involve not having sex on days when the woman could become pregnant. This information is not intended to replace advice given to you by your health care provider. Make sure you discuss any questions you have with your health care provider. Document Released: 02/01/2005 Document Revised: 02/03/2017 Document Reviewed: 03/06/2016 Elsevier Patient Education  2020 [employer] Steps to Quit Smoking Smoking tobacco is the leading cause of preventable death. It can affect almost every organ in the body. Smoking puts you and people around you at risk for many serious, long-lasting (chronic) diseases. Quitting smoking can be hard, but it is one of the best things that you can do for your health. It is never too late to quit. How do I get ready to quit? When you decide to quit smoking, make a plan to help you succeed. Before you quit:  Pick a date to quit. Set a date within the next 2 weeks to give you time to prepare.  Write down the reasons why you are quitting. Keep this list in places where you will see it often.  Tell your family, friends, and co-workers that you are quitting. Their support is important.  Talk with your doctor about the choices that may help you quit.  Find out if your health insurance will pay for these treatments.  Know the people, places, things, and activities that make you want to smoke (triggers). Avoid them. What first steps can I take to quit smoking?  Throw away all cigarettes at home, at work, and in your car.  Throw away the things that you use when you smoke, such as ashtrays and lighters.  Clean your car. Make sure to empty the ashtray.  Clean your home, including curtains and carpets. What can I do to help me quit smoking? Talk with your doctor about taking medicines and seeing a counselor at the same  time. You are more likely to succeed when you do both.  If you are pregnant or breastfeeding, talk with your doctor about counseling or other ways to quit smoking. Do not take medicine to help you quit smoking unless your doctor tells you to do so. To quit smoking: Quit right away  Quit smoking totally, instead of slowly cutting back on how much you smoke over a period of time.  Go to counseling. You are more likely to quit if you go to counseling sessions regularly. Take medicine You may take medicines to help you quit. Some medicines need a prescription, and some you can buy over-the-counter. Some medicines may contain a drug called nicotine to replace the nicotine in cigarettes. Medicines may:  Help you to stop having the desire to smoke (cravings).  Help to stop the problems that come when you stop smoking (withdrawal symptoms). Your doctor may ask you to use:  Nicotine patches, gum, or lozenges.  Nicotine inhalers or sprays.  Non-nicotine medicine that is taken by mouth. Find resources Find resources and other ways to help you quit smoking and remain smoke-free after you quit. These resources are most helpful when you use them often. They include:  Online chats with a Social worker.  Phone quitlines.  Printed Furniture conservator/restorer.  Support groups or group counseling.  Text messaging programs.  Mobile phone apps. Use apps on your mobile phone or tablet that can help you stick to your quit plan. There are many free apps for mobile phones and tablets as well as websites. Examples include Quit Guide from the [employer] and smokefree.gov  What things can I do to make it easier to quit?   Talk to your family and friends. Ask them to support and encourage you.  Call a phone quitline (1-800-QUIT-NOW), reach out to support groups, or work with a Social worker.  Ask people who smoke to not smoke around you.  Avoid places that make you want to smoke, such as: ? Bars. ? Parties. ?  Smoke-break areas at work.  Spend time with people who do not smoke.  Lower the stress in your life. Stress can make you want to smoke. Try these things to help your stress: ? Getting regular exercise. ? Doing deep-breathing exercises. ? Doing yoga. ? Meditating. ? Doing a body scan. To do this, close your eyes, focus on one area of your body at a time from head to toe. Notice which parts of your body are tense. Try to relax the muscles in those areas. How will I feel when I quit smoking? Day 1 to 3 weeks Within the first 24 hours, you may start to have some problems that come from quitting tobacco. These problems are very bad 2-3 days after you quit, but they do not often last for  more than 2-3 weeks. You may get these symptoms:  Mood swings.  Feeling restless, nervous, angry, or annoyed.  Trouble concentrating.  Dizziness.  Strong desire for high-sugar foods and nicotine.  Weight gain.  Trouble pooping (constipation).  Feeling like you may vomit (nausea).  Coughing or a sore throat.  Changes in how the medicines that you take for other issues work in your body.  Depression.  Trouble sleeping (insomnia). Week 3 and afterward After the first 2-3 weeks of quitting, you may start to notice more positive results, such as:  Better sense of smell and taste.  Less coughing and sore throat.  Slower heart rate.  Lower blood pressure.  Clearer skin.  Better breathing.  Fewer sick days. Quitting smoking can be hard. Do not give up if you fail the first time. Some people need to try a few times before they succeed. Do your best to stick to your quit plan, and talk with your doctor if you have any questions or concerns. Summary  Smoking tobacco is the leading cause of preventable death. Quitting smoking can be hard, but it is one of the best things that you can do for your health.  When you decide to quit smoking, make a plan to help you succeed.  Quit smoking right  away, not slowly over a period of time.  When you start quitting, seek help from your doctor, family, or friends. This information is not intended to replace advice given to you by your health care provider. Make sure you discuss any questions you have with your health care provider. Document Released: 11/28/2008 Document Revised: 04/21/2018 Document Reviewed: 04/22/2018 Elsevier Patient Education  2020 [employer].

## 2018-11-01 NOTE — Progress Notes (Signed)
Patient ID: Kathleen Powell, female   DOB: 08/31/1985, 33 y.o.   MRN: [id number]  CC: annual exam  HPI Kathleen Powell is a 33 y.o. female.  G1P1001 No LMP recorded. Patient has had an injection. She has used DMPA for years and is amenorrheic. Currently not sexually active, does not plan future childbearing. Migraine is currently well controlled and neurology f/u is scheduled HPI  Past Medical History:  Diagnosis Date  . Migraine   . No pertinent past medical history   . Vertigo     Past Surgical History:  Procedure Laterality Date  . CESAREAN SECTION  11/27/2011   Procedure: CESAREAN SECTION;  Surgeon: Kathreen CosierBernard A Marshall, MD;  Location: [facility];  Service: Obstetrics;  Laterality: N/A;  Primary cesarean section with delivery of baby boy at [id number]. Apgars 8/9.    Family History  Problem Relation Age of Onset  . Hypertension Mother   . Cancer Mother        thyroid  . Heart disease Father        Visual merchandiserpace maker  . Heart disease Maternal Grandmother        congestive heart failure  . Hypertension Maternal Grandmother   . Heart attack Maternal Grandmother   . Asthma Sister   . Gout Maternal Grandfather   . Anesthesia problems Neg Hx     Social History Social History   Tobacco Use  . Smoking status: Current Every Day Smoker    Packs/day: 0.25    Types: Cigarettes    Last attempt to quit: 09/09/2011    Years since quitting: 7.1  . Smokeless tobacco: Never Used  Substance Use Topics  . Alcohol use: No    Alcohol/week: 0.0 standard drinks  . Drug use: No    No Known Allergies  Current Outpatient Medications  Medication Sig Dispense Refill  . meclizine (ANTIVERT) 12.5 MG tablet Take 1 tablet (12.5 mg total) by mouth 3 (three) times daily as needed for dizziness. 30 tablet 0  . medroxyPROGESTERone (DEPO-PROVERA) 150 MG/ML injection Inject 1 mL (150 mg total) into the muscle every 3 (three) months. 1 mL 1  . propranolol ER (INDERAL LA) 120 MG 24 hr capsule Take 1 capsule (120 mg  total) by mouth daily. 30 capsule 11  . rizatriptan (MAXALT-MLT) 10 MG disintegrating tablet Take 1 tablet (10 mg total) by mouth as needed for migraine. May repeat in 2 hours if needed 9 tablet 11  . ibuprofen (ADVIL,MOTRIN) 800 MG tablet Take 1 tablet (800 mg total) by mouth every 8 (eight) hours as needed. 30 tablet 5  . medroxyPROGESTERone Acetate 150 MG/ML SUSY Inject 1 mL (150 mg total) into the muscle every 3 (three) months. 1 mL 4   Current Facility-Administered Medications  Medication Dose Route Frequency Provider Last Rate Last Dose  . medroxyPROGESTERone (DEPO-PROVERA) injection 150 mg  150 mg Intramuscular Q90 days Antionette CharJackson-Moore, Lisa, MD   150 mg at 04/25/18 1616    Review of Systems Review of Systems  Constitutional: Negative.   Genitourinary: Negative.   Neurological: Negative.   Psychiatric/Behavioral: Negative.     Blood pressure 120/78, pulse 89, height 5\' 3"  (1.6 m), weight 147 lb (66.7 kg).  Physical Exam Physical Exam Vitals signs and nursing note reviewed.  Constitutional:      Appearance: Normal appearance. She is not ill-appearing.  HENT:     Nose: Nose normal.     Mouth/Throat:     Mouth: Mucous membranes are moist.  Eyes:  Pupils: Pupils are equal, round, and reactive to light.  Neck:     Musculoskeletal: Normal range of motion.  Cardiovascular:     Rate and Rhythm: Normal rate.  Pulmonary:     Effort: Pulmonary effort is normal.  Abdominal:     General: Abdomen is flat.     Palpations: Abdomen is soft.  Skin:    General: Skin is warm and dry.  Neurological:     General: No focal deficit present.     Mental Status: She is alert.  Psychiatric:        Mood and Affect: Mood normal.        Behavior: Behavior normal.   Breasts: breasts appear normal, no suspicious masses, no skin or nipple changes or axillary nodes.   Data Reviewed Pap result 2018 and 2019  Assessment Well woman exam normal  Depo provera with no complaints Migraine  under care of neurologist Smoker  Plan Consider LARC especially IUD or sterilization as long term use of DMPA may result in decrease bone density Annual exams Pap repeat in 2-4 years Breast self exam Encouraged to quit smoking    Emeterio Reeve 11/01/2018, 4:36 PM

## 2019-01-01 ENCOUNTER — Ambulatory Visit (INDEPENDENT_AMBULATORY_CARE_PROVIDER_SITE_OTHER): Payer: 59

## 2019-01-01 ENCOUNTER — Other Ambulatory Visit: Payer: Self-pay

## 2019-01-01 DIAGNOSIS — Z3042 Encounter for surveillance of injectable contraceptive: Secondary | ICD-10-CM

## 2019-01-01 NOTE — Progress Notes (Signed)
Pt is in the office for depo injection, administered in RD and pt tolerated well, next due 03/19/19- 04/02/19 .Marland Kitchen Administrations This Visit    medroxyPROGESTERone (DEPO-PROVERA) injection 150 mg    Admin Date 01/01/2019 Action Given Dose 150 mg Route Intramuscular Administered By Hinton Lovely, RN

## 2019-01-02 NOTE — Progress Notes (Signed)
Patient seen and assessed by nursing staff during this encounter. I have reviewed the chart and agree with the documentation and plan.  Mora Bellman, MD 01/02/2019 8:10 AM

## 2019-03-22 ENCOUNTER — Ambulatory Visit: Payer: 59

## 2019-04-02 ENCOUNTER — Ambulatory Visit: Payer: 59

## 2019-04-11 ENCOUNTER — Ambulatory Visit (INDEPENDENT_AMBULATORY_CARE_PROVIDER_SITE_OTHER): Payer: 59

## 2019-04-11 ENCOUNTER — Other Ambulatory Visit: Payer: Self-pay

## 2019-04-11 ENCOUNTER — Other Ambulatory Visit: Payer: Self-pay | Admitting: Obstetrics

## 2019-04-11 VITALS — BP 119/79 | HR 90 | Wt 149.9 lb

## 2019-04-11 DIAGNOSIS — Z3042 Encounter for surveillance of injectable contraceptive: Secondary | ICD-10-CM

## 2019-04-11 MED ORDER — MEDROXYPROGESTERONE ACETATE 150 MG/ML IM SUSP: 150.0000 mg | INTRAMUSCULAR | 2 refills | Status: DC

## 2019-04-11 MED ORDER — MEDROXYPROGESTERONE ACETATE 150 MG/ML IM SUSP
150.0000 mg | Freq: Once | INTRAMUSCULAR | Status: AC
  Administered 2019-04-11: 16:00:00 150 mg via INTRAMUSCULAR

## 2019-04-11 NOTE — Progress Notes (Signed)
Presents for DEPO, patient is within her 2 weeks window of last date for DEPO per Nursing Protocol, given in LD, tolerated well.  Next DEPO May 12-26, 2021  Administrations This Visit    medroxyPROGESTERone (DEPO-PROVERA) injection 150 mg    Admin Date 04/11/2019 Action Given Dose 150 mg Route Intramuscular Administered By Maretta Bees, RMA

## 2019-04-12 NOTE — Progress Notes (Signed)
Patient seen and assessed by nursing staff during this encounter. I have reviewed the chart and agree with the documentation and plan.  Catalina Antigua, MD 04/12/2019 10:22 AM

## 2019-05-29 ENCOUNTER — Other Ambulatory Visit: Payer: Self-pay

## 2019-05-30 ENCOUNTER — Encounter: Payer: Self-pay | Admitting: Internal Medicine

## 2019-05-30 ENCOUNTER — Ambulatory Visit (INDEPENDENT_AMBULATORY_CARE_PROVIDER_SITE_OTHER): Payer: 59 | Admitting: Internal Medicine

## 2019-05-30 VITALS — BP 110/70 | HR 101 | Temp 98.3°F | Ht 63.0 in | Wt 154.1 lb

## 2019-05-30 DIAGNOSIS — Z720 Tobacco use: Secondary | ICD-10-CM | POA: Insufficient documentation

## 2019-05-30 DIAGNOSIS — G43109 Migraine with aura, not intractable, without status migrainosus: Secondary | ICD-10-CM

## 2019-05-30 NOTE — Patient Instructions (Signed)
-Nice seeing you today!!  -Come back in around 3 months for your physical. Come in fasting that day.   Smoking Tobacco Information, Adult Smoking tobacco can be harmful to your health. Tobacco contains a poisonous (toxic), colorless chemical called nicotine. Nicotine is addictive. It changes the brain and can make it hard to stop smoking. Tobacco also has other toxic chemicals that can hurt your body and raise your risk of many cancers. How can smoking tobacco affect me? Smoking tobacco puts you at risk for:  Cancer. Smoking is most commonly associated with lung cancer, but can also lead to cancer in other parts of the body.  Chronic obstructive pulmonary disease (COPD). This is a long-term lung condition that makes it hard to breathe. It also gets worse over time.  High blood pressure (hypertension), heart disease, stroke, or heart attack.  Lung infections, such as pneumonia.  Cataracts. This is when the lenses in the eyes become clouded.  Digestive problems. This may include peptic ulcers, heartburn, and gastroesophageal reflux disease (GERD).  Oral health problems, such as gum disease and tooth loss.  Loss of taste and smell. Smoking can affect your appearance by causing:  Wrinkles.  Yellow or stained teeth, fingers, and fingernails. Smoking tobacco can also affect your social life, because:  It may be challenging to find places to smoke when away from home. Many workplaces, [employer], hotels, and public places are tobacco-free.  Smoking is expensive. This is due to the cost of tobacco and the long-term costs of treating health problems from smoking.  Secondhand smoke may affect those around you. Secondhand smoke can cause lung cancer, breathing problems, and heart disease. Children of smokers have a higher risk for: ? Sudden infant death syndrome (SIDS). ? Ear infections. ? Lung infections. If you currently smoke tobacco, quitting now can help you:  Lead a longer and  healthier life.  Look, smell, breathe, and feel better over time.  Save money.  Protect others from the harms of secondhand smoke. What actions can I take to prevent health problems? Quit smoking   Do not start smoking. Quit if you already do.  Make a plan to quit smoking and commit to it. Look for programs to help you and ask your health care provider for recommendations and ideas.  Set a date and write down all the reasons you want to quit.  Let your friends and family know you are quitting so they can help and support you. Consider finding friends who also want to quit. It can be easier to quit with someone else, so that you can support each other.  Talk with your health care provider about using nicotine replacement medicines to help you quit, such as gum, lozenges, patches, sprays, or pills.  Do not replace cigarette smoking with electronic cigarettes, which are commonly called e-cigarettes. The safety of e-cigarettes is not known, and some may contain harmful chemicals.  If you try to quit but return to smoking, stay positive. It is common to slip up when you first quit, so take it one day at a time.  Be prepared for cravings. When you feel the urge to smoke, chew gum or suck on hard candy. Lifestyle  Stay busy and take care of your body.  Drink enough fluid to keep your urine pale yellow.  Get plenty of exercise and eat a healthy diet. This can help prevent weight gain after quitting.  Monitor your eating habits. Quitting smoking can cause you to have a larger appetite  than when you smoke.  Find ways to relax. Go out with friends or family to a movie or a restaurant where people do not smoke.  Ask your health care provider about having regular tests (screenings) to check for cancer. This may include blood tests, imaging tests, and other tests.  Find ways to manage your stress, such as meditation, yoga, or exercise. Where to find support To get support to quit smoking,  consider:  Asking your health care provider for more information and resources.  Taking classes to learn more about quitting smoking.  Looking for local organizations that offer resources about quitting smoking.  Joining a support group for people who want to quit smoking in your local community.  Calling the smokefree.gov counselor helpline: 1-800-Quit-Now (434)407-4789) Where to find more information You may find more information about quitting smoking from:  HelpGuide.org: www.helpguide.org  https://hall.com/: smokefree.gov  American Lung Association: www.lung.org Contact a health care provider if you:  Have problems breathing.  Notice that your lips, nose, or fingers turn blue.  Have chest pain.  Are coughing up blood.  Feel faint or you pass out.  Have other health changes that cause you to worry. Summary  Smoking tobacco can negatively affect your health, the health of those around you, your finances, and your social life.  Do not start smoking. Quit if you already do. If you need help quitting, ask your health care provider.  Think about joining a support group for people who want to quit smoking in your local community. There are many effective programs that will help you to quit this behavior. This information is not intended to replace advice given to you by your health care provider. Make sure you discuss any questions you have with your health care provider. Document Revised: 10/27/2018 Document Reviewed: 02/17/2016 Elsevier Patient Education  2020 [employer].

## 2019-05-30 NOTE — Progress Notes (Signed)
New Patient Office Visit     This visit occurred during the SARS-CoV-2 public health emergency.  Safety protocols were in place, including screening questions prior to the visit, additional usage of staff PPE, and extensive cleaning of exam room while observing appropriate contact time as indicated for disinfecting solutions.    CC/Reason for Visit: Establish care, discuss chronic conditions Previous PCP: none Last Visit: unknown  HPI: Kathleen Powell is a 34 y.o. female who is coming in today for the above mentioned reasons. Past Medical History is significant for: migraine HAs followed by neurology on propranolol and rizatriptan. As well as tobacco abuse. She currently smokes about 0.5 PPD and has been smoking for around 15 years. She sees GYN routinely. Has no acute complaints today.  She works for [employer] in [employer]. Has a 40 yr old son, lives with her mother. ETOH occasionally, NKDA, PSH only significant for a c-section. FH is significant for mom with HTN and hypothyroidism, dad with HTN and CAD. PGF and paternal uncle both decreased with colon cancer.   Past Medical/Surgical History: Past Medical History:  Diagnosis Date  . Migraine   . No pertinent past medical history   . Tobacco abuse   . Vertigo     Past Surgical History:  Procedure Laterality Date  . CESAREAN SECTION  11/27/2011   Procedure: CESAREAN SECTION;  Surgeon: Kathreen Cosier, MD;  Location: [facility];  Service: Obstetrics;  Laterality: N/A;  Primary cesarean section with delivery of baby boy at 55. Apgars 8/9.    Social History:  reports that she has been smoking cigarettes. She has been smoking about 0.25 packs per day. She has never used smokeless tobacco. She reports current alcohol use. She reports that she does not use drugs.  Allergies: No Known Allergies  Family History:  Family History  Problem Relation Age of Onset  . Hypertension Mother   . Cancer Mother        thyroid    . Heart disease Father        Visual merchandiser  . Heart disease Maternal Grandmother        congestive heart failure  . Hypertension Maternal Grandmother   . Heart attack Maternal Grandmother   . Asthma Sister   . Gout Maternal Grandfather   . Colon cancer Paternal Grandfather   . Colon cancer Paternal Uncle   . Anesthesia problems Neg Hx      Current Outpatient Medications:  .  ibuprofen (ADVIL,MOTRIN) 800 MG tablet, Take 1 tablet (800 mg total) by mouth every 8 (eight) hours as needed., Disp: 30 tablet, Rfl: 5 .  medroxyPROGESTERone (DEPO-PROVERA) 150 MG/ML injection, Inject 1 mL (150 mg total) into the muscle every 3 (three) months., Disp: 1 mL, Rfl: 2 .  propranolol ER (INDERAL LA) 120 MG 24 hr capsule, Take 1 capsule (120 mg total) by mouth daily., Disp: 30 capsule, Rfl: 11 .  rizatriptan (MAXALT-MLT) 10 MG disintegrating tablet, Take 1 tablet (10 mg total) by mouth as needed for migraine. May repeat in 2 hours if needed, Disp: 9 tablet, Rfl: 11  Review of Systems:  Constitutional: Denies fever, chills, diaphoresis, appetite change and fatigue.  HEENT: Denies photophobia, eye pain, redness, hearing loss, ear pain, congestion, sore throat, rhinorrhea, sneezing, mouth sores, trouble swallowing, neck pain, neck stiffness and tinnitus.   Respiratory: Denies SOB, DOE, cough, chest tightness,  and wheezing.   Cardiovascular: Denies chest pain, palpitations and leg swelling.  Gastrointestinal: Denies nausea, vomiting,  abdominal pain, diarrhea, constipation, blood in stool and abdominal distention.  Genitourinary: Denies dysuria, urgency, frequency, hematuria, flank pain and difficulty urinating.  Endocrine: Denies: hot or cold intolerance, sweats, changes in hair or nails, polyuria, polydipsia. Musculoskeletal: Denies myalgias, back pain, joint swelling, arthralgias and gait problem.  Skin: Denies pallor, rash and wound.  Neurological: Denies dizziness, seizures, syncope, weakness,  light-headedness, numbness and headaches.  Hematological: Denies adenopathy. Easy bruising, personal or family bleeding history  Psychiatric/Behavioral: Denies suicidal ideation, mood changes, confusion, nervousness, sleep disturbance and agitation    Physical Exam: Vitals:   05/30/19 1425  BP: 110/70  Pulse: (!) 101  Temp: 98.3 F (36.8 C)  TempSrc: Temporal  SpO2: 98%  Weight: 154 lb 1.6 oz (69.9 kg)  Height: 5\' 3"  (1.6 m)   Body mass index is 27.3 kg/m.  Constitutional: NAD, calm, comfortable Eyes: PERRL, lids and conjunctivae normal ENMT: Mucous membranes are moist.  Respiratory: clear to auscultation bilaterally, no wheezing, no crackles. Normal respiratory effort. No accessory muscle use.  Cardiovascular: Regular rate and rhythm, no murmurs / rubs / gallops. No extremity edema.  Neurologic: grossly intact and non-focal Psychiatric: Normal judgment and insight. Alert and oriented x 3. Normal mood.    Impression and Plan:  Migraine with aura and without status migrainosus, not intractable -Followed by neurology. -On proph propranolol and rizatriptan PRN.  Tobacco abuse -We have discussed cessation today. -She is in the pre-contemplative stage.  She will return in 3 months fasting for her CPE.    Patient Instructions  -Nice seeing you today!!  -Come back in around 3 months for your physical. Come in fasting that day.   Smoking Tobacco Information, Adult Smoking tobacco can be harmful to your health. Tobacco contains a poisonous (toxic), colorless chemical called nicotine. Nicotine is addictive. It changes the brain and can make it hard to stop smoking. Tobacco also has other toxic chemicals that can hurt your body and raise your risk of many cancers. How can smoking tobacco affect me? Smoking tobacco puts you at risk for:  Cancer. Smoking is most commonly associated with lung cancer, but can also lead to cancer in other parts of the body.  Chronic  obstructive pulmonary disease (COPD). This is a long-term lung condition that makes it hard to breathe. It also gets worse over time.  High blood pressure (hypertension), heart disease, stroke, or heart attack.  Lung infections, such as pneumonia.  Cataracts. This is when the lenses in the eyes become clouded.  Digestive problems. This may include peptic ulcers, heartburn, and gastroesophageal reflux disease (GERD).  Oral health problems, such as gum disease and tooth loss.  Loss of taste and smell. Smoking can affect your appearance by causing:  Wrinkles.  Yellow or stained teeth, fingers, and fingernails. Smoking tobacco can also affect your social life, because:  It may be challenging to find places to smoke when away from home. Many workplaces, [employer], hotels, and public places are tobacco-free.  Smoking is expensive. This is due to the cost of tobacco and the long-term costs of treating health problems from smoking.  Secondhand smoke may affect those around you. Secondhand smoke can cause lung cancer, breathing problems, and heart disease. Children of smokers have a higher risk for: ? Sudden infant death syndrome (SIDS). ? Ear infections. ? Lung infections. If you currently smoke tobacco, quitting now can help you:  Lead a longer and healthier life.  Look, smell, breathe, and feel better over time.  Save money.  Protect  others from the harms of secondhand smoke. What actions can I take to prevent health problems? Quit smoking   Do not start smoking. Quit if you already do.  Make a plan to quit smoking and commit to it. Look for programs to help you and ask your health care provider for recommendations and ideas.  Set a date and write down all the reasons you want to quit.  Let your friends and family know you are quitting so they can help and support you. Consider finding friends who also want to quit. It can be easier to quit with someone else, so that you  can support each other.  Talk with your health care provider about using nicotine replacement medicines to help you quit, such as gum, lozenges, patches, sprays, or pills.  Do not replace cigarette smoking with electronic cigarettes, which are commonly called e-cigarettes. The safety of e-cigarettes is not known, and some may contain harmful chemicals.  If you try to quit but return to smoking, stay positive. It is common to slip up when you first quit, so take it one day at a time.  Be prepared for cravings. When you feel the urge to smoke, chew gum or suck on hard candy. Lifestyle  Stay busy and take care of your body.  Drink enough fluid to keep your urine pale yellow.  Get plenty of exercise and eat a healthy diet. This can help prevent weight gain after quitting.  Monitor your eating habits. Quitting smoking can cause you to have a larger appetite than when you smoke.  Find ways to relax. Go out with friends or family to a movie or a restaurant where people do not smoke.  Ask your health care provider about having regular tests (screenings) to check for cancer. This may include blood tests, imaging tests, and other tests.  Find ways to manage your stress, such as meditation, yoga, or exercise. Where to find support To get support to quit smoking, consider:  Asking your health care provider for more information and resources.  Taking classes to learn more about quitting smoking.  Looking for local organizations that offer resources about quitting smoking.  Joining a support group for people who want to quit smoking in your local community.  Calling the smokefree.gov counselor helpline: 1-800-Quit-Now 838 622 8100) Where to find more information You may find more information about quitting smoking from:  HelpGuide.org: www.helpguide.org  BankRights[employer]: smokefree.gov  American Lung Association: www.lung.org Contact a health care provider if you:  Have problems  breathing.  Notice that your lips, nose, or fingers turn blue.  Have chest pain.  Are coughing up blood.  Feel faint or you pass out.  Have other health changes that cause you to worry. Summary  Smoking tobacco can negatively affect your health, the health of those around you, your finances, and your social life.  Do not start smoking. Quit if you already do. If you need help quitting, ask your health care provider.  Think about joining a support group for people who want to quit smoking in your local community. There are many effective programs that will help you to quit this behavior. This information is not intended to replace advice given to you by your health care provider. Make sure you discuss any questions you have with your health care provider. Document Revised: 10/27/2018 Document Reviewed: 02/17/2016 Elsevier Patient Education  2020 Elsevier Inc.      Chaya Jan, MD [facility]

## 2019-07-04 ENCOUNTER — Other Ambulatory Visit: Payer: Self-pay

## 2019-07-04 ENCOUNTER — Ambulatory Visit (INDEPENDENT_AMBULATORY_CARE_PROVIDER_SITE_OTHER): Payer: 59

## 2019-07-04 VITALS — BP 106/70 | HR 85 | Ht 63.0 in | Wt 153.0 lb

## 2019-07-04 DIAGNOSIS — Z3042 Encounter for surveillance of injectable contraceptive: Secondary | ICD-10-CM

## 2019-07-04 MED ORDER — MEDROXYPROGESTERONE ACETATE 150 MG/ML IM SUSP
150.0000 mg | Freq: Once | INTRAMUSCULAR | Status: AC
  Administered 2019-07-04: 150 mg via INTRAMUSCULAR

## 2019-07-04 NOTE — Progress Notes (Signed)
Presents for DEPO, given in RD, tolerated well.  Next DEPO August 4-18, 2021  Administrations This Visit    medroxyPROGESTERone (DEPO-PROVERA) injection 150 mg    Admin Date 07/04/2019 Action Given Dose 150 mg Route Intramuscular Administered By Maretta Bees, RMA

## 2019-08-29 ENCOUNTER — Other Ambulatory Visit: Payer: Self-pay

## 2019-08-29 ENCOUNTER — Encounter: Payer: 59 | Admitting: Internal Medicine

## 2019-08-29 ENCOUNTER — Ambulatory Visit (INDEPENDENT_AMBULATORY_CARE_PROVIDER_SITE_OTHER): Payer: 59 | Admitting: Internal Medicine

## 2019-08-29 ENCOUNTER — Encounter: Payer: Self-pay | Admitting: Internal Medicine

## 2019-08-29 VITALS — BP 110/70 | HR 111 | Temp 98.2°F | Ht 63.0 in | Wt 152.3 lb

## 2019-08-29 DIAGNOSIS — Z720 Tobacco use: Secondary | ICD-10-CM | POA: Diagnosis not present

## 2019-08-29 DIAGNOSIS — Z0000 Encounter for general adult medical examination without abnormal findings: Secondary | ICD-10-CM | POA: Diagnosis not present

## 2019-08-29 DIAGNOSIS — G43709 Chronic migraine without aura, not intractable, without status migrainosus: Secondary | ICD-10-CM

## 2019-08-29 DIAGNOSIS — M5441 Lumbago with sciatica, right side: Secondary | ICD-10-CM

## 2019-08-29 MED ORDER — CYCLOBENZAPRINE HCL 5 MG PO TABS: 5.0000 mg | ORAL_TABLET | Freq: Two times a day (BID) | ORAL | 1 refills | Status: DC | PRN

## 2019-08-29 NOTE — Patient Instructions (Signed)
-Nice seeing you today!!  -Lab work today; will notify you once results are available.  -Remember to schedule your COVID vaccines.  -Schedule follow up in 1 year or sooner as needed.   Preventive Care 36-36 Years Old, Female Preventive care refers to visits with your health care provider and lifestyle choices that can promote health and wellness. This includes:  A yearly physical exam. This may also be called an annual well check.  Regular dental visits and eye exams.  Immunizations.  Screening for certain conditions.  Healthy lifestyle choices, such as eating a healthy diet, getting regular exercise, not using drugs or products that contain nicotine and tobacco, and limiting alcohol use. What can I expect for my preventive care visit? Physical exam Your health care provider will check your:  Height and weight. This may be used to calculate body mass index (BMI), which tells if you are at a healthy weight.  Heart rate and blood pressure.  Skin for abnormal spots. Counseling Your health care provider may ask you questions about your:  Alcohol, tobacco, and drug use.  Emotional well-being.  Home and relationship well-being.  Sexual activity.  Eating habits.  Work and work Statistician.  Method of birth control.  Menstrual cycle.  Pregnancy history. What immunizations do I need?  Influenza (flu) vaccine  This is recommended every year. Tetanus, diphtheria, and pertussis (Tdap) vaccine  You may need a Td booster every 10 years. Varicella (chickenpox) vaccine  You may need this if you have not been vaccinated. Human papillomavirus (HPV) vaccine  If recommended by your health care provider, you may need three doses over 6 months. Measles, mumps, and rubella (MMR) vaccine  You may need at least one dose of MMR. You may also need a second dose. Meningococcal conjugate (MenACWY) vaccine  One dose is recommended if you are age 36-21 years and a first-year  college student living in a residence hall, or if you have one of several medical conditions. You may also need additional booster doses. Pneumococcal conjugate (PCV13) vaccine  You may need this if you have certain conditions and were not previously vaccinated. Pneumococcal polysaccharide (PPSV23) vaccine  You may need one or two doses if you smoke cigarettes or if you have certain conditions. Hepatitis A vaccine  You may need this if you have certain conditions or if you travel or work in places where you may be exposed to hepatitis A. Hepatitis B vaccine  You may need this if you have certain conditions or if you travel or work in places where you may be exposed to hepatitis B. Haemophilus influenzae type b (Hib) vaccine  You may need this if you have certain conditions. You may receive vaccines as individual doses or as more than one vaccine together in one shot (combination vaccines). Talk with your health care provider about the risks and benefits of combination vaccines. What tests do I need?  Blood tests  Lipid and cholesterol levels. These may be checked every 5 years starting at age 36.  Hepatitis C test.  Hepatitis B test. Screening  Diabetes screening. This is done by checking your blood sugar (glucose) after you have not eaten for a while (fasting).  Sexually transmitted disease (STD) testing.  BRCA-related cancer screening. This may be done if you have a family history of breast, ovarian, tubal, or peritoneal cancers.  Pelvic exam and Pap test. This may be done every 3 years starting at age 36. Starting at age 53, this may be done  every 5 years if you have a Pap test in combination with an HPV test. Talk with your health care provider about your test results, treatment options, and if necessary, the need for more tests. Follow these instructions at home: Eating and drinking   Eat a diet that includes fresh fruits and vegetables, whole grains, lean protein, and  low-fat dairy.  Take vitamin and mineral supplements as recommended by your health care provider.  Do not drink alcohol if: ? Your health care provider tells you not to drink. ? You are pregnant, may be pregnant, or are planning to become pregnant.  If you drink alcohol: ? Limit how much you have to 0-1 drink a day. ? Be aware of how much alcohol is in your drink. In the [place] one drink equals one 12 oz bottle of beer (355 mL), one 5 oz glass of wine (148 mL), or one 1 oz glass of hard liquor (44 mL). Lifestyle  Take daily care of your teeth and gums.  Stay active. Exercise for at least 30 minutes on 5 or more days each week.  Do not use any products that contain nicotine or tobacco, such as cigarettes, e-cigarettes, and chewing tobacco. If you need help quitting, ask your health care provider.  If you are sexually active, practice safe sex. Use a condom or other form of birth control (contraception) in order to prevent pregnancy and STIs (sexually transmitted infections). If you plan to become pregnant, see your health care provider for a preconception visit. What's next?  Visit your health care provider once a year for a well check visit.  Ask your health care provider how often you should have your eyes and teeth checked.  Stay up to date on all vaccines. This information is not intended to replace advice given to you by your health care provider. Make sure you discuss any questions you have with your health care provider. Document Revised: 10/13/2017 Document Reviewed: 10/13/2017 Elsevier Patient Education  2020 [employer].

## 2019-08-29 NOTE — Progress Notes (Signed)
Established Patient Office Visit     This visit occurred during the SARS-CoV-2 public health emergency.  Safety protocols were in place, including screening questions prior to the visit, additional usage of staff PPE, and extensive cleaning of exam room while observing appropriate contact time as indicated for disinfecting solutions.    CC/Reason for Visit: Annual preventive exam, discuss back pain  HPI: Kathleen Powell is a 34 y.o. female who is coming in today for the above mentioned reasons. Past Medical History is significant for: Tobacco abuse of about three quarters of a pack a day tobacco abuse of about three quarters of a pack a day, but otherwise does not have any chronic medical conditions.  She suffered a fall on March 29 when she slipped on a wet floor.  Since then she has been having bilateral lower back pain that shoots down the outside of her right thigh.  She has been applying heat without success.  She has routine eye and dental care.  She has not yet received her Covid vaccine, she follows with GYN routinely and last had her Pap smear in 2020.   Past Medical/Surgical History: Past Medical History:  Diagnosis Date  . Migraine   . No pertinent past medical history   . Tobacco abuse   . Vertigo     Past Surgical History:  Procedure Laterality Date  . CESAREAN SECTION  11/27/2011   Procedure: CESAREAN SECTION;  Surgeon: Frederico Hamman, MD;  Location: [facility];  Service: Obstetrics;  Laterality: N/A;  Primary cesarean section with delivery of baby boy at 73. Apgars 8/9.    Social History:  reports that she has been smoking cigarettes. She has been smoking about 0.25 packs per day. She has never used smokeless tobacco. She reports current alcohol use. She reports that she does not use drugs.  Allergies: No Known Allergies  Family History:  Family History  Problem Relation Age of Onset  . Hypertension Mother   . Cancer Mother        thyroid  . Heart  disease Father        Psychologist, forensic  . Heart disease Maternal Grandmother        congestive heart failure  . Hypertension Maternal Grandmother   . Heart attack Maternal Grandmother   . Asthma Sister   . Gout Maternal Grandfather   . Colon cancer Paternal Grandfather   . Colon cancer Paternal Uncle   . Anesthesia problems Neg Hx      Current Outpatient Medications:  .  ibuprofen (ADVIL,MOTRIN) 800 MG tablet, Take 1 tablet (800 mg total) by mouth every 8 (eight) hours as needed., Disp: 30 tablet, Rfl: 5 .  medroxyPROGESTERone (DEPO-PROVERA) 150 MG/ML injection, Inject 1 mL (150 mg total) into the muscle every 3 (three) months., Disp: 1 mL, Rfl: 2 .  propranolol ER (INDERAL LA) 120 MG 24 hr capsule, Take 1 capsule (120 mg total) by mouth daily., Disp: 30 capsule, Rfl: 11 .  rizatriptan (MAXALT-MLT) 10 MG disintegrating tablet, Take 1 tablet (10 mg total) by mouth as needed for migraine. May repeat in 2 hours if needed, Disp: 9 tablet, Rfl: 11 .  cyclobenzaprine (FLEXERIL) 5 MG tablet, Take 1 tablet (5 mg total) by mouth 2 (two) times daily as needed for muscle spasms., Disp: 30 tablet, Rfl: 1  Review of Systems:  Constitutional: Denies fever, chills, diaphoresis, appetite change and fatigue.  HEENT: Denies photophobia, eye pain, redness, hearing loss, ear pain, congestion, sore  throat, rhinorrhea, sneezing, mouth sores, trouble swallowing, neck pain, neck stiffness and tinnitus.   Respiratory: Denies SOB, DOE, cough, chest tightness,  and wheezing.   Cardiovascular: Denies chest pain, palpitations and leg swelling.  Gastrointestinal: Denies nausea, vomiting, abdominal pain, diarrhea, constipation, blood in stool and abdominal distention.  Genitourinary: Denies dysuria, urgency, frequency, hematuria, flank pain and difficulty urinating.  Endocrine: Denies: hot or cold intolerance, sweats, changes in hair or nails, polyuria, polydipsia. Musculoskeletal: Denies joint swelling, arthralgias and  gait problem.  Skin: Denies pallor, rash and wound.  Neurological: Denies dizziness, seizures, syncope, weakness, light-headedness, numbness and headaches.  Hematological: Denies adenopathy. Easy bruising, personal or family bleeding history  Psychiatric/Behavioral: Denies suicidal ideation, mood changes, confusion, nervousness, sleep disturbance and agitation    Physical Exam: Vitals:   08/29/19 1002  BP: 110/70  Pulse: (!) 111  Temp: 98.2 F (36.8 C)  TempSrc: Temporal  SpO2: 98%  Weight: 152 lb 4.8 oz (69.1 kg)  Height: '5\' 3"'$  (1.6 m)    Body mass index is 26.98 kg/m.   Constitutional: NAD, calm, comfortable Eyes: PERRL, lids and conjunctivae normal ENMT: Mucous membranes are moist.Tympanic membrane is pearly white, no erythema or bulging. Neck: normal, supple, no masses, no thyromegaly Respiratory: clear to auscultation bilaterally, no wheezing, no crackles. Normal respiratory effort. No accessory muscle use.  Cardiovascular: Regular rate and rhythm, no murmurs / rubs / gallops. No extremity edema. 2+ pedal pulses.  Abdomen: no tenderness, no masses palpated. No hepatosplenomegaly. Bowel sounds positive.  Musculoskeletal: no clubbing / cyanosis. No joint deformity upper and lower extremities. Good ROM, no contractures. Normal muscle tone.  Skin: no rashes, lesions, ulcers. No induration Neurologic: CN 2-12 grossly intact. Sensation intact, DTR normal. Strength 5/5 in all 4.  Psychiatric: Normal judgment and insight. Alert and oriented x 3. Normal mood.    Impression and Plan:  Encounter for preventive health examination -Advised routine eye and dental care which she already has. -Advised that she receive her Covid vaccines, otherwise immunizations are up-to-date and age-appropriate. -Screening labs today. -Healthy lifestyle discussed in detail. -Commence routine colon cancer screening at age 60 and breast cancer screening at age 51. -She sees GYN with Pap smears every  3 years, last in 2020.  Tobacco abuse -I have discussed tobacco cessation with the patient.  I have counseled the patient regarding the negative impacts of continued tobacco use including but not limited to lung cancer, COPD, and cardiovascular disease.  I have discussed alternatives to tobacco and modalities that may help facilitate tobacco cessation including but not limited to biofeedback, hypnosis, and medications.  Total time spent with tobacco counseling was 3 minutes. -She remains in the precontemplative stage. -I will continue to address at subsequent visits with her.  Chronic migraine without aura without status migrainosus, not intractable -No migraines since we last spoke  Acute bilateral low back pain with right-sided sciatica -Icing, back stretches, ibuprofen and flexeril PRN. -Contact us if no improvement.   Patient Instructions  -Nice seeing you today!!  -Lab work today; will notify you once results are available.  -Remember to schedule your COVID vaccines.  -Schedule follow up in 1 year or sooner as needed.   Preventive Care 61-59 Years Old, Female Preventive care refers to visits with your health care provider and lifestyle choices that can promote health and wellness. This includes:  A yearly physical exam. This may also be called an annual well check.  Regular dental visits and eye exams.  Immunizations.  Screening for  certain conditions.  Healthy lifestyle choices, such as eating a healthy diet, getting regular exercise, not using drugs or products that contain nicotine and tobacco, and limiting alcohol use. What can I expect for my preventive care visit? Physical exam Your health care provider will check your:  Height and weight. This may be used to calculate body mass index (BMI), which tells if you are at a healthy weight.  Heart rate and blood pressure.  Skin for abnormal spots. Counseling Your health care provider may ask you questions about  your:  Alcohol, tobacco, and drug use.  Emotional well-being.  Home and relationship well-being.  Sexual activity.  Eating habits.  Work and work Astronomer.  Method of birth control.  Menstrual cycle.  Pregnancy history. What immunizations do I need?  Influenza (flu) vaccine  This is recommended every year. Tetanus, diphtheria, and pertussis (Tdap) vaccine  You may need a Td booster every 10 years. Varicella (chickenpox) vaccine  You may need this if you have not been vaccinated. Human papillomavirus (HPV) vaccine  If recommended by your health care provider, you may need three doses over 6 months. Measles, mumps, and rubella (MMR) vaccine  You may need at least one dose of MMR. You may also need a second dose. Meningococcal conjugate (MenACWY) vaccine  One dose is recommended if you are age 62-21 years and a first-year college student living in a residence hall, or if you have one of several medical conditions. You may also need additional booster doses. Pneumococcal conjugate (PCV13) vaccine  You may need this if you have certain conditions and were not previously vaccinated. Pneumococcal polysaccharide (PPSV23) vaccine  You may need one or two doses if you smoke cigarettes or if you have certain conditions. Hepatitis A vaccine  You may need this if you have certain conditions or if you travel or work in places where you may be exposed to hepatitis A. Hepatitis B vaccine  You may need this if you have certain conditions or if you travel or work in places where you may be exposed to hepatitis B. Haemophilus influenzae type b (Hib) vaccine  You may need this if you have certain conditions. You may receive vaccines as individual doses or as more than one vaccine together in one shot (combination vaccines). Talk with your health care provider about the risks and benefits of combination vaccines. What tests do I need?  Blood tests  Lipid and cholesterol  levels. These may be checked every 5 years starting at age 69.  Hepatitis C test.  Hepatitis B test. Screening  Diabetes screening. This is done by checking your blood sugar (glucose) after you have not eaten for a while (fasting).  Sexually transmitted disease (STD) testing.  BRCA-related cancer screening. This may be done if you have a family history of breast, ovarian, tubal, or peritoneal cancers.  Pelvic exam and Pap test. This may be done every 3 years starting at age 70. Starting at age 66, this may be done every 5 years if you have a Pap test in combination with an HPV test. Talk with your health care provider about your test results, treatment options, and if necessary, the need for more tests. Follow these instructions at home: Eating and drinking   Eat a diet that includes fresh fruits and vegetables, whole grains, lean protein, and low-fat dairy.  Take vitamin and mineral supplements as recommended by your health care provider.  Do not drink alcohol if: ? Your health care provider tells you  not to drink. ? You are pregnant, may be pregnant, or are planning to become pregnant.  If you drink alcohol: ? Limit how much you have to 0-1 drink a day. ? Be aware of how much alcohol is in your drink. In the [place] one drink equals one 12 oz bottle of beer (355 mL), one 5 oz glass of wine (148 mL), or one 1 oz glass of hard liquor (44 mL). Lifestyle  Take daily care of your teeth and gums.  Stay active. Exercise for at least 30 minutes on 5 or more days each week.  Do not use any products that contain nicotine or tobacco, such as cigarettes, e-cigarettes, and chewing tobacco. If you need help quitting, ask your health care provider.  If you are sexually active, practice safe sex. Use a condom or other form of birth control (contraception) in order to prevent pregnancy and STIs (sexually transmitted infections). If you plan to become pregnant, see your health care provider for  a preconception visit. What's next?  Visit your health care provider once a year for a well check visit.  Ask your health care provider how often you should have your eyes and teeth checked.  Stay up to date on all vaccines. This information is not intended to replace advice given to you by your health care provider. Make sure you discuss any questions you have with your health care provider. Document Revised: 10/13/2017 Document Reviewed: 10/13/2017 Elsevier Patient Education  2020 [place], [place] [facility]

## 2019-08-30 LAB — HEMOGLOBIN A1C
Est. average glucose Bld gHb Est-mCnc: 123 mg/dL
Hgb A1c MFr Bld: 5.9 % — ABNORMAL HIGH (ref 4.8–5.6)

## 2019-08-30 LAB — CBC WITH DIFFERENTIAL/PLATELET
Basophils Absolute: 0 10*3/uL (ref 0.0–0.2)
Basos: 1 %
EOS (ABSOLUTE): 0.1 10*3/uL (ref 0.0–0.4)
Eos: 1 %
Hematocrit: 40.1 % (ref 34.0–46.6)
Hemoglobin: 13.2 g/dL (ref 11.1–15.9)
Immature Grans (Abs): 0 10*3/uL (ref 0.0–0.1)
Immature Granulocytes: 0 %
Lymphocytes Absolute: 1.5 10*3/uL (ref 0.7–3.1)
Lymphs: 31 %
MCH: 30.3 pg (ref 26.6–33.0)
MCHC: 32.9 g/dL (ref 31.5–35.7)
MCV: 92 fL (ref 79–97)
Monocytes Absolute: 0.6 10*3/uL (ref 0.1–0.9)
Monocytes: 13 %
Neutrophils Absolute: 2.5 10*3/uL (ref 1.4–7.0)
Neutrophils: 54 %
Platelets: 267 10*3/uL (ref 150–450)
RBC: 4.35 x10E6/uL (ref 3.77–5.28)
RDW: 12.2 % (ref 11.7–15.4)
WBC: 4.7 10*3/uL (ref 3.4–10.8)

## 2019-08-30 LAB — COMPREHENSIVE METABOLIC PANEL
ALT: 49 IU/L — ABNORMAL HIGH (ref 0–32)
AST: 40 IU/L (ref 0–40)
Albumin/Globulin Ratio: 1.7 (ref 1.2–2.2)
Albumin: 4.5 g/dL (ref 3.8–4.8)
Alkaline Phosphatase: 61 IU/L (ref 48–121)
BUN/Creatinine Ratio: 15 (ref 9–23)
BUN: 12 mg/dL (ref 6–20)
Bilirubin Total: 0.6 mg/dL (ref 0.0–1.2)
CO2: 22 mmol/L (ref 20–29)
Calcium: 9.3 mg/dL (ref 8.7–10.2)
Chloride: 106 mmol/L (ref 96–106)
Creatinine, Ser: 0.8 mg/dL (ref 0.57–1.00)
GFR calc Af Amer: 110 mL/min/{1.73_m2} (ref >59)
GFR calc non Af Amer: 95 mL/min/{1.73_m2} (ref >59)
Globulin, Total: 2.7 g/dL (ref 1.5–4.5)
Glucose: 83 mg/dL (ref 65–99)
Potassium: 4.1 mmol/L (ref 3.5–5.2)
Sodium: 141 mmol/L (ref 134–144)
Total Protein: 7.2 g/dL (ref 6.0–8.5)

## 2019-08-30 LAB — LIPID PANEL
Chol/HDL Ratio: 2.8 ratio (ref 0.0–4.4)
Cholesterol, Total: 138 mg/dL (ref 100–199)
HDL: 50 mg/dL (ref >39)
LDL Chol Calc (NIH): 77 mg/dL (ref 0–99)
Triglycerides: 51 mg/dL (ref 0–149)
VLDL Cholesterol Cal: 11 mg/dL (ref 5–40)

## 2019-08-30 LAB — TSH: TSH: 1.32 u[IU]/mL (ref 0.450–4.500)

## 2019-08-30 LAB — VITAMIN B12: Vitamin B-12: 385 pg/mL (ref 232–1245)

## 2019-08-30 LAB — VITAMIN D 25 HYDROXY (VIT D DEFICIENCY, FRACTURES): Vit D, 25-Hydroxy: 14.2 ng/mL — ABNORMAL LOW (ref 30.0–100.0)

## 2019-09-19 ENCOUNTER — Ambulatory Visit: Payer: 59 | Admitting: Internal Medicine

## 2019-09-19 ENCOUNTER — Other Ambulatory Visit: Payer: Self-pay

## 2019-09-19 ENCOUNTER — Encounter: Payer: Self-pay | Admitting: Internal Medicine

## 2019-09-19 VITALS — BP 110/80 | HR 100 | Temp 98.3°F | Wt 151.9 lb

## 2019-09-19 DIAGNOSIS — M5441 Lumbago with sciatica, right side: Secondary | ICD-10-CM

## 2019-09-19 MED ORDER — METHYLPREDNISOLONE ACETATE 80 MG/ML IJ SUSP
80.0000 mg | Freq: Once | INTRAMUSCULAR | Status: AC
  Administered 2019-09-19: 80 mg via INTRAMUSCULAR

## 2019-09-19 MED ORDER — PREDNISONE 10 MG (21) PO TBPK: ORAL_TABLET | ORAL | 0 refills | Status: DC

## 2019-09-19 NOTE — Addendum Note (Signed)
Addended by: Kern Reap B on: 09/19/2019 07:41 AM   Modules accepted: Orders

## 2019-09-19 NOTE — Patient Instructions (Signed)
-Nice seeing you today!!  -Steroid injection today.  -Prednisone pack to start tomorrow 8/5. Take as directed.  -Referral to PT will be placed.   Sciatica  Sciatica is pain, numbness, weakness, or tingling along the path of the sciatic nerve. The sciatic nerve starts in the lower back and runs down the back of each leg. The nerve controls the muscles in the lower leg and in the back of the knee. It also provides feeling (sensation) to the back of the thigh, the lower leg, and the sole of the foot. Sciatica is a symptom of another medical condition that pinches or puts pressure on the sciatic nerve. Sciatica most often only affects one side of the body. Sciatica usually goes away on its own or with treatment. In some cases, sciatica may come back (recur). What are the causes? This condition is caused by pressure on the sciatic nerve or pinching of the nerve. This may be the result of:  A disk in between the bones of the spine bulging out too far (herniated disk).  Age-related changes in the spinal disks.  A pain disorder that affects a muscle in the buttock.  Extra bone growth near the sciatic nerve.  A break (fracture) of the pelvis.  Pregnancy.  Tumor. This is rare. What increases the risk? The following factors may make you more likely to develop this condition:  Playing sports that place pressure or stress on the spine.  Having poor strength and flexibility.  A history of back injury or surgery.  Sitting for long periods of time.  Doing activities that involve repetitive bending or lifting.  Obesity. What are the signs or symptoms? Symptoms can vary from mild to very severe, and they may include:  Any of these problems in the lower back, leg, hip, or buttock: ? Mild tingling, numbness, or dull aches. ? Burning sensations. ? Sharp pains.  Numbness in the back of the calf or the sole of the foot.  Leg weakness.  Severe back pain that makes movement  difficult. Symptoms may get worse when you cough, sneeze, or laugh, or when you sit or stand for long periods of time. How is this diagnosed? This condition may be diagnosed based on:  Your symptoms and medical history.  A physical exam.  Blood tests.  Imaging tests, such as: ? X-rays. ? MRI. ? CT scan. How is this treated? In many cases, this condition improves on its own without treatment. However, treatment may include:  Reducing or modifying physical activity.  Exercising and stretching.  Icing and applying heat to the affected area.  Medicines that help to: ? Relieve pain and swelling. ? Relax your muscles.  Injections of medicines that help to relieve pain, irritation, and inflammation around the sciatic nerve (steroids).  Surgery. Follow these instructions at home: Medicines  Take over-the-counter and prescription medicines only as told by your health care provider.  Ask your health care provider if the medicine prescribed to you: ? Requires you to avoid driving or using heavy machinery. ? Can cause constipation. You may need to take these actions to prevent or treat constipation:  Drink enough fluid to keep your urine pale yellow.  Take over-the-counter or prescription medicines.  Eat foods that are high in fiber, such as beans, whole grains, and fresh fruits and vegetables.  Limit foods that are high in fat and processed sugars, such as fried or sweet foods. Managing pain      If directed, put ice on the  affected area. ? Put ice in a plastic bag. ? Place a towel between your skin and the bag. ? Leave the ice on for 20 minutes, 2-3 times a day.  If directed, apply heat to the affected area. Use the heat source that your health care provider recommends, such as a moist heat pack or a heating pad. ? Place a towel between your skin and the heat source. ? Leave the heat on for 20-30 minutes. ? Remove the heat if your skin turns bright red. This is  especially important if you are unable to feel pain, heat, or cold. You may have a greater risk of getting burned. Activity   Return to your normal activities as told by your health care provider. Ask your health care provider what activities are safe for you.  Avoid activities that make your symptoms worse.  Take brief periods of rest throughout the day. ? When you rest for longer periods, mix in some mild activity or stretching between periods of rest. This will help to prevent stiffness and pain. ? Avoid sitting for long periods of time without moving. Get up and move around at least one time each hour.  Exercise and stretch regularly, as told by your health care provider.  Do not lift anything that is heavier than 10 lb (4.5 kg) while you have symptoms of sciatica. When you do not have symptoms, you should still avoid heavy lifting, especially repetitive heavy lifting.  When you lift objects, always use proper lifting technique, which includes: ? Bending your knees. ? Keeping the load close to your body. ? Avoiding twisting. General instructions  Maintain a healthy weight. Excess weight puts extra stress on your back.  Wear supportive, comfortable shoes. Avoid wearing high heels.  Avoid sleeping on a mattress that is too soft or too hard. A mattress that is firm enough to support your back when you sleep may help to reduce your pain.  Keep all follow-up visits as told by your health care provider. This is important. Contact a health care provider if:  You have pain that: ? Wakes you up when you are sleeping. ? Gets worse when you lie down. ? Is worse than you have experienced in the past. ? Lasts longer than 4 weeks.  You have an unexplained weight loss. Get help right away if:  You are not able to control when you urinate or have bowel movements (incontinence).  You have: ? Weakness in your lower back, pelvis, buttocks, or legs that gets worse. ? Redness or swelling  of your back. ? A burning sensation when you urinate. Summary  Sciatica is pain, numbness, weakness, or tingling along the path of the sciatic nerve.  This condition is caused by pressure on the sciatic nerve or pinching of the nerve.  Sciatica can cause pain, numbness, or tingling in the lower back, legs, hips, and buttocks.  Treatment often includes rest, exercise, medicines, and applying ice or heat. This information is not intended to replace advice given to you by your health care provider. Make sure you discuss any questions you have with your health care provider. Document Revised: 02/20/2018 Document Reviewed: 02/20/2018 Elsevier Patient Education  2020 [employer].

## 2019-09-19 NOTE — Progress Notes (Signed)
Acute office Visit     This visit occurred during the SARS-CoV-2 public health emergency.  Safety protocols were in place, including screening questions prior to the visit, additional usage of staff PPE, and extensive cleaning of exam room while observing appropriate contact time as indicated for disinfecting solutions.    CC/Reason for Visit: Continued back pain  HPI: Kathleen Powell is a 34 y.o. female who is coming in today for the above mentioned reasons.  She was seen in the beginning of July for bilateral lower acute back pain with right-sided sciatica.  She suffered a fall from ground-level back in May.  She feels like pain started shortly after.  At last visit she was advised conservative methods including icing, back stretches, as needed ibuprofen, muscle relaxers.  She continues to have pain despite this.  She has no bowel/bladder incontinence, no numbness of her feet or legs.  No leg weakness.   Past Medical/Surgical History: Past Medical History:  Diagnosis Date   Migraine    No pertinent past medical history    Tobacco abuse    Vertigo     Past Surgical History:  Procedure Laterality Date   CESAREAN SECTION  11/27/2011   Procedure: CESAREAN SECTION;  Surgeon: Kathreen Cosier, MD;  Location: [facility];  Service: Obstetrics;  Laterality: N/A;  Primary cesarean section with delivery of baby boy at 74. Apgars 8/9.    Social History:  reports that she has been smoking cigarettes. She has been smoking about 0.25 packs per day. She has never used smokeless tobacco. She reports current alcohol use. She reports that she does not use drugs.  Allergies: No Known Allergies  Family History:  Family History  Problem Relation Age of Onset   Hypertension Mother    Cancer Mother        thyroid   Heart disease Father        pace maker   Heart disease Maternal Grandmother        congestive heart failure   Hypertension Maternal Grandmother    Heart attack  Maternal Grandmother    Asthma Sister    Gout Maternal Grandfather    Colon cancer Paternal Grandfather    Colon cancer Paternal Uncle    Anesthesia problems Neg Hx      Current Outpatient Medications:    cyclobenzaprine (FLEXERIL) 5 MG tablet, Take 1 tablet (5 mg total) by mouth 2 (two) times daily as needed for muscle spasms., Disp: 30 tablet, Rfl: 1   ibuprofen (ADVIL,MOTRIN) 800 MG tablet, Take 1 tablet (800 mg total) by mouth every 8 (eight) hours as needed., Disp: 30 tablet, Rfl: 5   medroxyPROGESTERone (DEPO-PROVERA) 150 MG/ML injection, Inject 1 mL (150 mg total) into the muscle every 3 (three) months., Disp: 1 mL, Rfl: 2   propranolol ER (INDERAL LA) 120 MG 24 hr capsule, Take 1 capsule (120 mg total) by mouth daily., Disp: 30 capsule, Rfl: 11   rizatriptan (MAXALT-MLT) 10 MG disintegrating tablet, Take 1 tablet (10 mg total) by mouth as needed for migraine. May repeat in 2 hours if needed, Disp: 9 tablet, Rfl: 11   predniSONE (STERAPRED UNI-PAK 21 TAB) 10 MG (21) TBPK tablet, Take as directed. Start on 8/5., Disp: 21 tablet, Rfl: 0  Current Facility-Administered Medications:    methylPREDNISolone acetate (DEPO-MEDROL) injection 80 mg, 80 mg, Intramuscular, Once, Philip Aspen, Limmie Patricia, MD  Review of Systems:  Constitutional: Denies fever, chills, diaphoresis, appetite change and fatigue.  HEENT: Denies  photophobia, eye pain, redness, hearing loss, ear pain, congestion, sore throat, rhinorrhea, sneezing, mouth sores, trouble swallowing, neck pain, neck stiffness and tinnitus.   Respiratory: Denies SOB, DOE, cough, chest tightness,  and wheezing.   Cardiovascular: Denies chest pain, palpitations and leg swelling.  Gastrointestinal: Denies nausea, vomiting, abdominal pain, diarrhea, constipation, blood in stool and abdominal distention.  Genitourinary: Denies dysuria, urgency, frequency, hematuria, flank pain and difficulty urinating.  Endocrine: Denies: hot or cold  intolerance, sweats, changes in hair or nails, polyuria, polydipsia. Musculoskeletal: Denies  joint swelling, arthralgias. Skin: Denies pallor, rash and wound.  Neurological: Denies dizziness, seizures, syncope, weakness, light-headedness, numbness and headaches.  Hematological: Denies adenopathy. Easy bruising, personal or family bleeding history  Psychiatric/Behavioral: Denies suicidal ideation, mood changes, confusion, nervousness, sleep disturbance and agitation    Physical Exam: Vitals:   09/19/19 0722  BP: 110/80  Pulse: 100  Temp: 98.3 F (36.8 C)  TempSrc: Oral  SpO2: 98%  Weight: 151 lb 14.4 oz (68.9 kg)    Body mass index is 26.91 kg/m.   Constitutional: NAD, calm, comfortable Eyes: PERRL, lids and conjunctivae normal ENMT: Mucous membranes are moist.  Musculoskeletal: no clubbing / cyanosis. No joint deformity upper and lower extremities. Good ROM, no contractures. Normal muscle tone.  Psychiatric: Normal judgment and insight. Alert and oriented x 3. Normal mood.    Impression and Plan:  Acute bilateral low back pain with right-sided sciatica -Will give Depo-Medrol IM in office today and sent home with a 6-day prednisone taper. -Will initiate referral to physical therapy. -She has advised to continue conservative measures such as icing, as needed NSAIDs, back stretches.    Patient Instructions  -Nice seeing you today!!  -Steroid injection today.  -Prednisone pack to start tomorrow 8/5. Take as directed.  -Referral to PT will be placed.   Sciatica  Sciatica is pain, numbness, weakness, or tingling along the path of the sciatic nerve. The sciatic nerve starts in the lower back and runs down the back of each leg. The nerve controls the muscles in the lower leg and in the back of the knee. It also provides feeling (sensation) to the back of the thigh, the lower leg, and the sole of the foot. Sciatica is a symptom of another medical condition that pinches or  puts pressure on the sciatic nerve. Sciatica most often only affects one side of the body. Sciatica usually goes away on its own or with treatment. In some cases, sciatica may come back (recur). What are the causes? This condition is caused by pressure on the sciatic nerve or pinching of the nerve. This may be the result of:  A disk in between the bones of the spine bulging out too far (herniated disk).  Age-related changes in the spinal disks.  A pain disorder that affects a muscle in the buttock.  Extra bone growth near the sciatic nerve.  A break (fracture) of the pelvis.  Pregnancy.  Tumor. This is rare. What increases the risk? The following factors may make you more likely to develop this condition:  Playing sports that place pressure or stress on the spine.  Having poor strength and flexibility.  A history of back injury or surgery.  Sitting for long periods of time.  Doing activities that involve repetitive bending or lifting.  Obesity. What are the signs or symptoms? Symptoms can vary from mild to very severe, and they may include:  Any of these problems in the lower back, leg, hip, or buttock: ?  Mild tingling, numbness, or dull aches. ? Burning sensations. ? Sharp pains.  Numbness in the back of the calf or the sole of the foot.  Leg weakness.  Severe back pain that makes movement difficult. Symptoms may get worse when you cough, sneeze, or laugh, or when you sit or stand for long periods of time. How is this diagnosed? This condition may be diagnosed based on:  Your symptoms and medical history.  A physical exam.  Blood tests.  Imaging tests, such as: ? X-rays. ? MRI. ? CT scan. How is this treated? In many cases, this condition improves on its own without treatment. However, treatment may include:  Reducing or modifying physical activity.  Exercising and stretching.  Icing and applying heat to the affected area.  Medicines that help  to: ? Relieve pain and swelling. ? Relax your muscles.  Injections of medicines that help to relieve pain, irritation, and inflammation around the sciatic nerve (steroids).  Surgery. Follow these instructions at home: Medicines  Take over-the-counter and prescription medicines only as told by your health care provider.  Ask your health care provider if the medicine prescribed to you: ? Requires you to avoid driving or using heavy machinery. ? Can cause constipation. You may need to take these actions to prevent or treat constipation:  Drink enough fluid to keep your urine pale yellow.  Take over-the-counter or prescription medicines.  Eat foods that are high in fiber, such as beans, whole grains, and fresh fruits and vegetables.  Limit foods that are high in fat and processed sugars, such as fried or sweet foods. Managing pain      If directed, put ice on the affected area. ? Put ice in a plastic bag. ? Place a towel between your skin and the bag. ? Leave the ice on for 20 minutes, 2-3 times a day.  If directed, apply heat to the affected area. Use the heat source that your health care provider recommends, such as a moist heat pack or a heating pad. ? Place a towel between your skin and the heat source. ? Leave the heat on for 20-30 minutes. ? Remove the heat if your skin turns bright red. This is especially important if you are unable to feel pain, heat, or cold. You may have a greater risk of getting burned. Activity   Return to your normal activities as told by your health care provider. Ask your health care provider what activities are safe for you.  Avoid activities that make your symptoms worse.  Take brief periods of rest throughout the day. ? When you rest for longer periods, mix in some mild activity or stretching between periods of rest. This will help to prevent stiffness and pain. ? Avoid sitting for long periods of time without moving. Get up and move around  at least one time each hour.  Exercise and stretch regularly, as told by your health care provider.  Do not lift anything that is heavier than 10 lb (4.5 kg) while you have symptoms of sciatica. When you do not have symptoms, you should still avoid heavy lifting, especially repetitive heavy lifting.  When you lift objects, always use proper lifting technique, which includes: ? Bending your knees. ? Keeping the load close to your body. ? Avoiding twisting. General instructions  Maintain a healthy weight. Excess weight puts extra stress on your back.  Wear supportive, comfortable shoes. Avoid wearing high heels.  Avoid sleeping on a mattress that is too soft or  too hard. A mattress that is firm enough to support your back when you sleep may help to reduce your pain.  Keep all follow-up visits as told by your health care provider. This is important. Contact a health care provider if:  You have pain that: ? Wakes you up when you are sleeping. ? Gets worse when you lie down. ? Is worse than you have experienced in the past. ? Lasts longer than 4 weeks.  You have an unexplained weight loss. Get help right away if:  You are not able to control when you urinate or have bowel movements (incontinence).  You have: ? Weakness in your lower back, pelvis, buttocks, or legs that gets worse. ? Redness or swelling of your back. ? A burning sensation when you urinate. Summary  Sciatica is pain, numbness, weakness, or tingling along the path of the sciatic nerve.  This condition is caused by pressure on the sciatic nerve or pinching of the nerve.  Sciatica can cause pain, numbness, or tingling in the lower back, legs, hips, and buttocks.  Treatment often includes rest, exercise, medicines, and applying ice or heat. This information is not intended to replace advice given to you by your health care provider. Make sure you discuss any questions you have with your health care  provider. Document Revised: 02/20/2018 Document Reviewed: 02/20/2018 Elsevier Patient Education  2020 [employer]      Chaya Jan, MD [facility]

## 2019-09-24 ENCOUNTER — Other Ambulatory Visit: Payer: Self-pay

## 2019-09-24 ENCOUNTER — Encounter (HOSPITAL_COMMUNITY): Payer: Self-pay

## 2019-09-24 ENCOUNTER — Ambulatory Visit (INDEPENDENT_AMBULATORY_CARE_PROVIDER_SITE_OTHER): Payer: 59

## 2019-09-24 ENCOUNTER — Ambulatory Visit (HOSPITAL_COMMUNITY)
Admission: EM | Admit: 2019-09-24 | Discharge: 2019-09-24 | Disposition: A | Discharge status: Home / Self Care | Payer: 59 | Attending: Family Medicine | Admitting: Family Medicine

## 2019-09-24 DIAGNOSIS — M545 Low back pain: Secondary | ICD-10-CM | POA: Diagnosis not present

## 2019-09-24 DIAGNOSIS — M5441 Lumbago with sciatica, right side: Secondary | ICD-10-CM | POA: Diagnosis not present

## 2019-09-24 DIAGNOSIS — Z3202 Encounter for pregnancy test, result negative: Secondary | ICD-10-CM | POA: Diagnosis not present

## 2019-09-24 LAB — POC URINE PREG, ED: Preg Test, Ur: NEGATIVE

## 2019-09-24 MED ORDER — CYCLOBENZAPRINE HCL 10 MG PO TABS
10.0000 mg | ORAL_TABLET | Freq: Two times a day (BID) | ORAL | 0 refills | Status: DC | PRN
Start: 2019-09-24 — End: 2020-10-10

## 2019-09-24 MED ORDER — KETOROLAC TROMETHAMINE 30 MG/ML IJ SOLN: 30.0000 mg | Freq: Once | INTRAMUSCULAR | Status: DC

## 2019-09-24 MED ORDER — TRAMADOL HCL 50 MG PO TABS: 50.0000 mg | ORAL_TABLET | Freq: Two times a day (BID) | ORAL | 0 refills | Status: DC | PRN

## 2019-09-24 MED ORDER — PREDNISONE 10 MG (21) PO TBPK
ORAL_TABLET | ORAL | 0 refills | Status: DC
Start: 2019-09-24 — End: 2020-02-18

## 2019-09-24 NOTE — Patient Instructions (Addendum)
We will restart propranolol 24hr 120mg  daily. I will start sumatriptan for abortive therapy. Please take 1 tablet at onset of headache. May take 1 additional tablet in 2 hours if needed. Do not take more than 2 tablets in 24 hours or more than 10 in a month.   Stay well hydrated. Eat a well balanced diet. Try to exercise daily.   Follow up with opthalmology if headaches do not improve after wearing glasses.   Follow up in 3-6 months.    Migraine Headache A migraine headache is a very strong throbbing pain on one side or both sides of your head. This type of headache can also cause other symptoms. It can last from 4 hours to 3 days. Talk with your doctor about what things may bring on (trigger) this condition. What are the causes? The exact cause of this condition is not known. This condition may be triggered or caused by:  Drinking alcohol.  Smoking.  Taking medicines, such as: ? Medicine used to treat chest pain (nitroglycerin). ? Birth control pills. ? Estrogen. ? Some blood pressure medicines.  Eating or drinking certain products.  Doing physical activity. Other things that may trigger a migraine headache include:  Having a menstrual period.  Pregnancy.  Hunger.  Stress.  Not getting enough sleep or getting too much sleep.  Weather changes.  Tiredness (fatigue). What increases the risk?  Being 39-67 years old.  Being female.  Having a family history of migraine headaches.  Being Caucasian.  Having depression or anxiety.  Being very overweight. What are the signs or symptoms?  A throbbing pain. This pain may: ? Happen in any area of the head, such as on one side or both sides. ? Make it hard to do daily activities. ? Get worse with physical activity. ? Get worse around bright lights or loud noises.  Other symptoms may include: ? Feeling sick to your stomach (nauseous). ? Vomiting. ? Dizziness. ? Being sensitive to bright lights, loud noises, or  smells.  Before you get a migraine headache, you may get warning signs (an aura). An aura may include: ? Seeing flashing lights or having blind spots. ? Seeing bright spots, halos, or zigzag lines. ? Having tunnel vision or blurred vision. ? Having numbness or a tingling feeling. ? Having trouble talking. ? Having weak muscles.  Some people have symptoms after a migraine headache (postdromal phase), such as: ? Tiredness. ? Trouble thinking (concentrating). How is this treated?  Taking medicines that: ? Relieve pain. ? Relieve the feeling of being sick to your stomach. ? Prevent migraine headaches.  Treatment may also include: ? Having acupuncture. ? Avoiding foods that bring on migraine headaches. ? Learning ways to control your body functions (biofeedback). ? Therapy to help you know and deal with negative thoughts (cognitive behavioral therapy). Follow these instructions at home: Medicines  Take over-the-counter and prescription medicines only as told by your doctor.  Ask your doctor if the medicine prescribed to you: ? Requires you to avoid driving or using heavy machinery. ? Can cause trouble pooping (constipation). You may need to take these steps to prevent or treat trouble pooping:  Drink enough fluid to keep your pee (urine) pale yellow.  Take over-the-counter or prescription medicines.  Eat foods that are high in fiber. These include beans, whole grains, and fresh fruits and vegetables.  Limit foods that are high in fat and sugar. These include fried or sweet foods. Lifestyle  Do not drink alcohol.  Do  not use any products that contain nicotine or tobacco, such as cigarettes, e-cigarettes, and chewing tobacco. If you need help quitting, ask your doctor.  Get at least 8 hours of sleep every night.  Limit and deal with stress. General instructions      Keep a journal to find out what may bring on your migraine headaches. For example, write down: ? What  you eat and drink. ? How much sleep you get. ? Any change in what you eat or drink. ? Any change in your medicines.  If you have a migraine headache: ? Avoid things that make your symptoms worse, such as bright lights. ? It may help to lie down in a dark, quiet room. ? Do not drive or use heavy machinery. ? Ask your doctor what activities are safe for you.  Keep all follow-up visits as told by your doctor. This is important. Contact a doctor if:  You get a migraine headache that is different or worse than others you have had.  You have more than 15 headache days in one month. Get help right away if:  Your migraine headache gets very bad.  Your migraine headache lasts longer than 72 hours.  You have a fever.  You have a stiff neck.  You have trouble seeing.  Your muscles feel weak or like you cannot control them.  You start to lose your balance a lot.  You start to have trouble walking.  You pass out (faint).  You have a seizure. Summary  A migraine headache is a very strong throbbing pain on one side or both sides of your head. These headaches can also cause other symptoms.  This condition may be treated with medicines and changes to your lifestyle.  Keep a journal to find out what may bring on your migraine headaches.  Contact a doctor if you get a migraine headache that is different or worse than others you have had.  Contact your doctor if you have more than 15 headache days in a month. This information is not intended to replace advice given to you by your health care provider. Make sure you discuss any questions you have with your health care provider. Document Revised: 05/26/2018 Document Reviewed: 03/16/2018 Elsevier Patient Education  2020 [employer]  

## 2019-09-24 NOTE — Progress Notes (Addendum)
PATIENT: Kathleen Powell DOB: 05/20/1985  REASON FOR VISIT: follow up HISTORY FROM: patient  Chief Complaint  Patient presents with  . Follow-up    follow up for migraines. Has on average 2-3 migraines per week. States that she does a lot of computer work and wonders if this is triggering the headaches.   . room 7    alone      HISTORY OF PRESENT ILLNESS: Today 09/25/19 Kathleen Powell is a 34 y.o. female here today for follow up for migraines. She was previously on propranolol 120mg  daily and rizatriptan for abortive therapy. She was last seen in 12/2017. She reports that she discontinued the medication around 12/2018. In 03/2019, she noted that headaches were worsened. She is now having about 7-8 headache days per month. At least 2-3 are migraines. She has unilateral throbbing pain and she has light and sound sensitivity. She denies nausea. Migraines can last 24 hours. Rizatriptan was not effective in aborting headache. She takes ibuprofen on occasion but not on regular basis. She recently started wearing rx glassess and is hoping this will help.   HISTORY: (copied from Dr Trevor MaceAhern's note on 12/29/2017)  HPI:  Kathleen Powell is a 34 y.o. female here as requested by Dr. Clearance CootsHarper for headaches. Headaches started at the age of 34. Worsening this year in the setting of stress. Can be unilateral but anywhere, behind the eyes, throbbing, severe, can't move, feels dizzy, +photophobia. A dark quiet room helps. Can last 24-72 hours. 25 headaches days a month for > 6 months, 20 are migrainous 3 days are severe and the moderately severe. She can have some spots in the vision during the headache but not always. She wakes with headaches. Headaches are positional. Sleep is a trigger too much, stress can make it worse. She has vision changes. Also experiences vertigo, room spinning as well as dizziness/imbalance. No other focal neurologic deficits, associated symptoms, inciting events or modifiable  factors.  Reviewed notes, labs and imaging from outside physicians, which showed:  Patient reports she has recent routine labwork done (cmp,cbc,tsh) will request   REVIEW OF SYSTEMS: Out of a complete 14 system review of symptoms, the patient complains only of the following symptoms, headaches and all other reviewed systems are negative.  ALLERGIES: No Known Allergies  HOME MEDICATIONS: Outpatient Medications Prior to Visit  Medication Sig Dispense Refill  . cyclobenzaprine (FLEXERIL) 10 MG tablet Take 1 tablet (10 mg total) by mouth 2 (two) times daily as needed for muscle spasms. 20 tablet 0  . ibuprofen (ADVIL,MOTRIN) 800 MG tablet Take 1 tablet (800 mg total) by mouth every 8 (eight) hours as needed. 30 tablet 5  . medroxyPROGESTERone (DEPO-PROVERA) 150 MG/ML injection Inject 1 mL (150 mg total) into the muscle every 3 (three) months. 1 mL 2  . predniSONE (STERAPRED UNI-PAK 21 TAB) 10 MG (21) TBPK tablet 6 tabs for 1 day, then 5 tabs for 1 das, then 4 tabs for 1 day, then 3 tabs for 1 day, 2 tabs for 1 day, then 1 tab for 1 day 21 tablet 0  . traMADol (ULTRAM) 50 MG tablet Take 1 tablet (50 mg total) by mouth every 12 (twelve) hours as needed. 12 tablet 0  . propranolol ER (INDERAL LA) 120 MG 24 hr capsule Take 1 capsule (120 mg total) by mouth daily. 30 capsule 11  . rizatriptan (MAXALT-MLT) 10 MG disintegrating tablet Take 1 tablet (10 mg total) by mouth as needed for migraine. May repeat in 2  hours if needed 9 tablet 11   No facility-administered medications prior to visit.    PAST MEDICAL HISTORY: Past Medical History:  Diagnosis Date  . Migraine   . Migraine   . No pertinent past medical history   . Tobacco abuse   . Vertigo     PAST SURGICAL HISTORY: Past Surgical History:  Procedure Laterality Date  . CESAREAN SECTION  11/27/2011   Procedure: CESAREAN SECTION;  Surgeon: Kathreen Cosier, MD;  Location: [facility];  Service: Obstetrics;  Laterality: N/A;  Primary  cesarean section with delivery of baby boy at 11. Apgars 8/9.    FAMILY HISTORY: Family History  Problem Relation Age of Onset  . Hypertension Mother   . Cancer Mother        thyroid  . Heart disease Father        Visual merchandiser  . Heart disease Maternal Grandmother        congestive heart failure  . Hypertension Maternal Grandmother   . Heart attack Maternal Grandmother   . Asthma Sister   . Gout Maternal Grandfather   . Colon cancer Paternal Grandfather   . Colon cancer Paternal Uncle   . Anesthesia problems Neg Hx     SOCIAL HISTORY: Social History   Socioeconomic History  . Marital status: Single    Spouse name: Not on file  . Number of children: Not on file  . Years of education: Not on file  . Highest education level: Some college, no degree  Occupational History  . Not on file  Tobacco Use  . Smoking status: Current Every Day Smoker    Packs/day: 0.25    Types: Cigarettes    Last attempt to quit: 09/09/2011    Years since quitting: 8.0  . Smokeless tobacco: Never Used  Vaping Use  . Vaping Use: Never used  Substance and Sexual Activity  . Alcohol use: Yes    Alcohol/week: 0.0 standard drinks    Comment: occasional  . Drug use: No  . Sexual activity: Yes    Birth control/protection: Injection  Other Topics Concern  . Not on file  Social History Narrative   Lives at home with her mother & son   Right handed   Caffeine: 3 cups a week    Social Determinants of Health   Financial Resource Strain:   . Difficulty of Paying Living Expenses:   Food Insecurity:   . Worried About Programme researcher, broadcasting/film/video in the Last Year:   . Barista in the Last Year:   Transportation Needs:   . Freight forwarder (Medical):   Marland Kitchen Lack of Transportation (Non-Medical):   Physical Activity:   . Days of Exercise per Week:   . Minutes of Exercise per Session:   Stress:   . Feeling of Stress :   Social Connections:   . Frequency of Communication with Friends and  Family:   . Frequency of Social Gatherings with Friends and Family:   . Attends Religious Services:   . Active Member of Clubs or Organizations:   . Attends Banker Meetings:   Marland Kitchen Marital Status:   Intimate Partner Violence:   . Fear of Current or Ex-Partner:   . Emotionally Abused:   Marland Kitchen Physically Abused:   . Sexually Abused:       PHYSICAL EXAM  Vitals:   09/25/19 0811  BP: 129/85  Pulse: 83  Weight: 153 lb 12.8 oz (69.8 kg)  Height: 5\' 3"  (  1.6 m)   Body mass index is 27.24 kg/m.  Generalized: Well developed, in no acute distress  Cardiology: normal rate and rhythm, no murmur noted Respiratory: clear to auscultation bilaterally  Neurological examination  Mentation: Alert oriented to time, place, history taking. Follows all commands speech and language fluent Cranial nerve II-XII: Pupils were equal round reactive to light. Extraocular movements were full, visual field were full on confrontational test. Facial sensation and strength were normal. Uvula tongue midline. Head turning and shoulder shrug  were normal and symmetric. Motor: The motor testing reveals 5 over 5 strength of all 4 extremities. Good symmetric motor tone is noted throughout.  Sensory: Sensory testing is intact to soft touch on all 4 extremities. No evidence of extinction is noted.  Coordination: Cerebellar testing reveals good finger-nose-finger and heel-to-shin bilaterally.  Gait and station: Gait is normal.   DIAGNOSTIC DATA (LABS, IMAGING, TESTING) - I reviewed patient records, labs, notes, testing and imaging myself where available.  No flowsheet data found.   Lab Results  Component Value Date   WBC 4.7 08/29/2019   HGB 13.2 08/29/2019   HCT 40.1 08/29/2019   MCV 92 08/29/2019   PLT 267 08/29/2019      Component Value Date/Time   NA 141 08/29/2019 1036   K 4.1 08/29/2019 1036   CL 106 08/29/2019 1036   CO2 22 08/29/2019 1036   GLUCOSE 83 08/29/2019 1036   BUN 12 08/29/2019  1036   CREATININE 0.80 08/29/2019 1036   CALCIUM 9.3 08/29/2019 1036   PROT 7.2 08/29/2019 1036   ALBUMIN 4.5 08/29/2019 1036   AST 40 08/29/2019 1036   ALT 49 (H) 08/29/2019 1036   ALKPHOS 61 08/29/2019 1036   BILITOT 0.6 08/29/2019 1036   GFRNONAA 95 08/29/2019 1036   GFRAA 110 08/29/2019 1036   Lab Results  Component Value Date   CHOL 138 08/29/2019   HDL 50 08/29/2019   LDLCALC 77 08/29/2019   TRIG 51 08/29/2019   CHOLHDL 2.8 08/29/2019   Lab Results  Component Value Date   HGBA1C 5.9 (H) 08/29/2019   Lab Results  Component Value Date   VITAMINB12 385 08/29/2019   Lab Results  Component Value Date   TSH 1.320 08/29/2019     ASSESSMENT AND PLAN 34 y.o. year old female  has a past medical history of Migraine, Migraine, No pertinent past medical history, Tobacco abuse, and Vertigo. here with     ICD-10-CM   1. Chronic migraine without aura without status migrainosus, not intractable  G43.709     Darilyn has noted an increase in headache frequency since discontinuing propranolol in 12/2018. She feels that this medicaiton worked well to prevent headaches and requests to restart. We will restart propranolol 24hr 120mg  daily. She was educated on possible side effects and advised to monitor symptoms closely. We will start sumatriptan for abortive therapy. Side effects and appropriate administration reviewed. She was advised to follow up with ophthalmology as directed. She was encouraged to focus on adequate hydration and healthy lifestyle habits. She will follow up in 3-6 months. She verbalizes understanding and agreement with this plan.    No orders of the defined types were placed in this encounter.    Meds ordered this encounter  Medications  . propranolol ER (INDERAL LA) 120 MG 24 hr capsule    Sig: Take 1 capsule (120 mg total) by mouth daily.    Dispense:  30 capsule    Refill:  11    Order  Specific Question:   Supervising Provider    Answer:   Anson Fret  [id number]  . SUMAtriptan (IMITREX) 50 MG tablet    Sig: Take 1 tablet (50 mg total) by mouth every 2 (two) hours as needed for migraine. May repeat in 2 hours if headache persists or recurs.    Dispense:  10 tablet    Refill:  0    Order Specific Question:   Supervising Provider    Answer:   Anson Fret [id number]      I spent 20 minutes with the patient. 50% of this time was spent counseling and educating patient on plan of care and medications.    Shawnie Dapper, FNP-C 09/25/2019, 9:33 AM [facility] [address], [address] [place], [place] [address] [phone]  Made any corrections needed, and agree with history, physical, neuro exam,assessment and plan as stated.     Naomie Dean, MD [facility]

## 2019-09-24 NOTE — Discharge Instructions (Addendum)
Your x ray was normal Heat to the back and stretches.  We will do another prednisone taper, flexeril for muscle relaxant. Tramadol for severe pain as  needed.  Rest. Follow up with primary care.

## 2019-09-24 NOTE — ED Triage Notes (Signed)
Pt c/o 8/10 throbbing pain in lower back that radiates down right leg to knee. Pt c/o tingling of right thigh. Pt states fell in May of this year and injured back, but pain is getting worse. Pt was able to walk to exam room.

## 2019-09-25 ENCOUNTER — Ambulatory Visit: Payer: 59 | Admitting: Family Medicine

## 2019-09-25 ENCOUNTER — Encounter: Payer: Self-pay | Admitting: Family Medicine

## 2019-09-25 VITALS — BP 129/85 | HR 83 | Ht 63.0 in | Wt 153.8 lb

## 2019-09-25 DIAGNOSIS — G43709 Chronic migraine without aura, not intractable, without status migrainosus: Secondary | ICD-10-CM | POA: Diagnosis not present

## 2019-09-25 MED ORDER — PROPRANOLOL HCL ER 120 MG PO CP24: 120.0000 mg | ORAL_CAPSULE | Freq: Every day | ORAL | 11 refills | Status: DC

## 2019-09-25 MED ORDER — SUMATRIPTAN SUCCINATE 50 MG PO TABS: 50.0000 mg | ORAL_TABLET | ORAL | 0 refills | Status: DC | PRN

## 2019-09-25 NOTE — ED Provider Notes (Signed)
[facility]    CSN: [id number] Arrival date & time: 09/24/19  1315      History   Chief Complaint Chief Complaint  Patient presents with  . Back Pain    HPI Kathleen Powell is a 34 y.o. female.   Patient is a 34 year old female presents today with continued lower back discomfort with radiation of pain down the right leg.  She has had some associated numbness and tingling in the right thigh.  Has been seen for the back pain twice by her primary care prior to this.  Has been on muscle relaxants and just finished prednisone taper but feels like the pain is getting worse.  This all started post fall that she had where she landed on the back back in May.  Denies any saddle paresthesias, loss of bowel or bladder function.  Has been referred to go to PT but they are unable to get her in until September.  ROS per HPI      Past Medical History:  Diagnosis Date  . Migraine   . Migraine   . No pertinent past medical history   . Tobacco abuse   . Vertigo     Patient Active Problem List   Diagnosis Date Noted  . Tobacco abuse   . Depo-Provera contraceptive status 11/01/2018  . Continuous dependence on cigarette smoking 11/01/2018  . Chronic migraine without aura without status migrainosus, not intractable 12/29/2017  . Migraine with aura and without status migrainosus, not intractable 12/29/2017    Past Surgical History:  Procedure Laterality Date  . CESAREAN SECTION  11/27/2011   Procedure: CESAREAN SECTION;  Surgeon: Kathreen Cosier, MD;  Location: [facility];  Service: Obstetrics;  Laterality: N/A;  Primary cesarean section with delivery of baby boy at 106. Apgars 8/9.    OB History    Gravida  1   Para  1   Term  1   Preterm      AB      Living  1     SAB      TAB      Ectopic      Multiple      Live Births  1            Home Medications    Prior to Admission medications   Medication Sig Start Date End Date Taking? Authorizing  Provider  cyclobenzaprine (FLEXERIL) 10 MG tablet Take 1 tablet (10 mg total) by mouth 2 (two) times daily as needed for muscle spasms. 09/24/19   Dahlia Byes A, NP  ibuprofen (ADVIL,MOTRIN) 800 MG tablet Take 1 tablet (800 mg total) by mouth every 8 (eight) hours as needed. 09/05/17   Brock Bad, MD  medroxyPROGESTERone (DEPO-PROVERA) 150 MG/ML injection Inject 1 mL (150 mg total) into the muscle every 3 (three) months. 04/11/19   Brock Bad, MD  predniSONE (STERAPRED UNI-PAK 21 TAB) 10 MG (21) TBPK tablet 6 tabs for 1 day, then 5 tabs for 1 das, then 4 tabs for 1 day, then 3 tabs for 1 day, 2 tabs for 1 day, then 1 tab for 1 day 09/24/19   Dahlia Byes A, NP  propranolol ER (INDERAL LA) 120 MG 24 hr capsule Take 1 capsule (120 mg total) by mouth daily. 12/29/17   Anson Fret, MD  rizatriptan (MAXALT-MLT) 10 MG disintegrating tablet Take 1 tablet (10 mg total) by mouth as needed for migraine. May repeat in 2 hours if needed 12/29/17  Anson Fret, MD  traMADol (ULTRAM) 50 MG tablet Take 1 tablet (50 mg total) by mouth every 12 (twelve) hours as needed. 09/24/19   Janace Aris, NP    Family History Family History  Problem Relation Age of Onset  . Hypertension Mother   . Cancer Mother        thyroid  . Heart disease Father        Visual merchandiser  . Heart disease Maternal Grandmother        congestive heart failure  . Hypertension Maternal Grandmother   . Heart attack Maternal Grandmother   . Asthma Sister   . Gout Maternal Grandfather   . Colon cancer Paternal Grandfather   . Colon cancer Paternal Uncle   . Anesthesia problems Neg Hx     Social History Social History   Tobacco Use  . Smoking status: Current Every Day Smoker    Packs/day: 0.25    Types: Cigarettes    Last attempt to quit: 09/09/2011    Years since quitting: 8.0  . Smokeless tobacco: Never Used  Vaping Use  . Vaping Use: Never used  Substance Use Topics  . Alcohol use: Yes    Alcohol/week: 0.0  standard drinks    Comment: occasional  . Drug use: No     Allergies   Patient has no known allergies.   Review of Systems Review of Systems   Physical Exam Triage Vital Signs ED Triage Vitals  Enc Vitals Group     BP 09/24/19 1543 111/81     Pulse Rate 09/24/19 1543 81     Resp 09/24/19 1543 16     Temp 09/24/19 1543 98.8 F (37.1 C)     Temp Source 09/24/19 1543 Oral     SpO2 09/24/19 1543 100 %     Weight 09/24/19 1550 151 lb (68.5 kg)     Height 09/24/19 1550 5\' 3"  (1.6 m)     Head Circumference --      Peak Flow --      Pain Score 09/24/19 1550 8     Pain Loc --      Pain Edu? --      Excl. in GC? --    No data found.  Updated Vital Signs BP 111/81 (BP Location: Left Arm)   Pulse 81   Temp 98.8 F (37.1 C) (Oral)   Resp 16   Ht 5\' 3"  (1.6 m)   Wt 151 lb (68.5 kg)   SpO2 100%   BMI 26.75 kg/m   Visual Acuity Right Eye Distance:   Left Eye Distance:   Bilateral Distance:    Right Eye Near:   Left Eye Near:    Bilateral Near:     Physical Exam Vitals and nursing note reviewed.  Constitutional:      General: She is not in acute distress.    Appearance: Normal appearance. She is not ill-appearing, toxic-appearing or diaphoretic.  HENT:     Head: Normocephalic.     Nose: Nose normal.  Eyes:     Conjunctiva/sclera: Conjunctivae normal.  Pulmonary:     Effort: Pulmonary effort is normal.  Musculoskeletal:        General: Normal range of motion.     Cervical back: Normal range of motion.       Back:     Comments: TTP, no swelling or deformity.   Skin:    General: Skin is warm and dry.     Findings: No  rash.  Neurological:     Mental Status: She is alert.  Psychiatric:        Mood and Affect: Mood normal.      UC Treatments / Results  Labs (all labs ordered are listed, but only abnormal results are displayed) Labs Reviewed  POC URINE PREG, ED    EKG   Radiology DG Lumbar Spine Complete  Result Date: 09/24/2019 CLINICAL DATA:   34 year old female with back pain after fall. EXAM: LUMBAR SPINE - COMPLETE 4+ VIEW COMPARISON:  None. FINDINGS: Five lumbar type vertebra. There is no acute fracture or subluxation of the lumbar spine. The vertebral body heights and disc spaces are maintained. The visualized posterior elements are intact. The soft tissues are unremarkable. IMPRESSION: Negative. Electronically Signed   By: Elgie Collard M.D.   On: 09/24/2019 16:39    Procedures Procedures (including critical care time)  Medications Ordered in UC Medications - No data to display  Initial Impression / Assessment and Plan / UC Course  I have reviewed the triage vital signs and the nursing notes.  Pertinent labs & imaging results that were available during my care of the patient were reviewed by me and considered in my medical decision making (see chart for details).     Acute right-sided low back pain with right side sciatica. X-ray without any acute findings. Recommended continue alternate heat and ice to the back and doing stretches. We will do another prednisone taper and refill Flexeril for muscle relaxant.  Tramadol for severe pain as needed. Rest and follow-up with primary care for continued problems Final Clinical Impressions(s) / UC Diagnoses   Final diagnoses:  Acute right-sided low back pain with right-sided sciatica     Discharge Instructions     Your x ray was normal Heat to the back and stretches.  We will do another prednisone taper, flexeril for muscle relaxant. Tramadol for severe pain as  needed.  Rest. Follow up with primary care.      ED Prescriptions    Medication Sig Dispense Auth. Provider   cyclobenzaprine (FLEXERIL) 10 MG tablet Take 1 tablet (10 mg total) by mouth 2 (two) times daily as needed for muscle spasms. 20 tablet Eevee Borbon A, NP   predniSONE (STERAPRED UNI-PAK 21 TAB) 10 MG (21) TBPK tablet 6 tabs for 1 day, then 5 tabs for 1 das, then 4 tabs for 1 day, then 3 tabs for 1  day, 2 tabs for 1 day, then 1 tab for 1 day 21 tablet Zeth Buday A, NP   traMADol (ULTRAM) 50 MG tablet Take 1 tablet (50 mg total) by mouth every 12 (twelve) hours as needed. 12 tablet Vantasia Pinkney A, NP     PDMP not reviewed this encounter.   Janace Aris, NP 09/25/19 [phone]

## 2019-09-26 ENCOUNTER — Ambulatory Visit: Payer: 59

## 2019-09-26 ENCOUNTER — Telehealth: Payer: Self-pay | Admitting: Internal Medicine

## 2019-09-26 DIAGNOSIS — Z0289 Encounter for other administrative examinations: Secondary | ICD-10-CM

## 2019-09-26 NOTE — Telephone Encounter (Signed)
Pt needs her FMLA form filled out. Pt would like the completed form faxed to [phone] [employer] and a call at [phone] informing her that it has been faxed.  Placed in red folder

## 2019-09-27 NOTE — Telephone Encounter (Signed)
Called patient and she requests a visit for her back pain.

## 2019-09-28 ENCOUNTER — Telehealth (INDEPENDENT_AMBULATORY_CARE_PROVIDER_SITE_OTHER): Payer: 59 | Admitting: Internal Medicine

## 2019-09-28 DIAGNOSIS — M5441 Lumbago with sciatica, right side: Secondary | ICD-10-CM

## 2019-09-28 NOTE — Progress Notes (Signed)
Virtual Visit via Telephone Note  I connected with Kathleen Powell on 09/28/19 at  3:30 PM EDT by telephone and verified that I am speaking with the correct person using two identifiers.   I discussed the limitations, risks, security and privacy concerns of performing an evaluation and management service by telephone and the availability of in person appointments. I also discussed with the patient that there may be a patient responsible charge related to this service. The patient expressed understanding and agreed to proceed.  Location patient: home Location provider: work office Participants present for the call: patient, provider Patient did not have a visit in the prior 7 days to address this/these issue(s).   History of Present Illness:  She has scheduled this visit mainly to discuss her FMLA paperwork that we received in the office.  She was requesting intermittent FMLA and we had contacted her to let her know that this was not appropriate.  She also would like to discuss her continued low back pain with sciatica.  I have seen her twice in the office for the same.  At the last visit she was given Depo-Medrol, prednisone taper and muscle relaxers.  She has been scheduled for PT but this just not start until late September.  After she saw me she went to the urgent care because of continued back pain.  An x-ray was negative.  She was given a second prednisone taper and a refill of her muscle relaxers and asked to follow-up with me.   Observations/Objective: Patient sounds cheerful and well on the phone. I do not appreciate any increased work of breathing. Speech and thought processing are grossly intact.  Patient reported vitals: None reported   Current Outpatient Medications:  .  cyclobenzaprine (FLEXERIL) 10 MG tablet, Take 1 tablet (10 mg total) by mouth 2 (two) times daily as needed for muscle spasms., Disp: 20 tablet, Rfl: 0 .  ibuprofen (ADVIL,MOTRIN) 800 MG tablet, Take 1  tablet (800 mg total) by mouth every 8 (eight) hours as needed., Disp: 30 tablet, Rfl: 5 .  medroxyPROGESTERone (DEPO-PROVERA) 150 MG/ML injection, Inject 1 mL (150 mg total) into the muscle every 3 (three) months., Disp: 1 mL, Rfl: 2 .  predniSONE (STERAPRED UNI-PAK 21 TAB) 10 MG (21) TBPK tablet, 6 tabs for 1 day, then 5 tabs for 1 das, then 4 tabs for 1 day, then 3 tabs for 1 day, 2 tabs for 1 day, then 1 tab for 1 day, Disp: 21 tablet, Rfl: 0 .  propranolol ER (INDERAL LA) 120 MG 24 hr capsule, Take 1 capsule (120 mg total) by mouth daily., Disp: 30 capsule, Rfl: 11 .  SUMAtriptan (IMITREX) 50 MG tablet, Take 1 tablet (50 mg total) by mouth every 2 (two) hours as needed for migraine. May repeat in 2 hours if headache persists or recurs., Disp: 10 tablet, Rfl: 0 .  traMADol (ULTRAM) 50 MG tablet, Take 1 tablet (50 mg total) by mouth every 12 (twelve) hours as needed., Disp: 12 tablet, Rfl: 0  Review of Systems:  Constitutional: Denies fever, chills, diaphoresis, appetite change and fatigue.  HEENT: Denies photophobia, eye pain, redness, hearing loss, ear pain, congestion, sore throat, rhinorrhea, sneezing, mouth sores, trouble swallowing, neck pain, neck stiffness and tinnitus.   Respiratory: Denies SOB, DOE, cough, chest tightness,  and wheezing.   Cardiovascular: Denies chest pain, palpitations and leg swelling.  Gastrointestinal: Denies nausea, vomiting, abdominal pain, diarrhea, constipation, blood in stool and abdominal distention.  Genitourinary: Denies dysuria,  urgency, frequency, hematuria, flank pain and difficulty urinating.  Endocrine: Denies: hot or cold intolerance, sweats, changes in hair or nails, polyuria, polydipsia. Musculoskeletal: Denies  joint swelling, arthralgias   Skin: Denies pallor, rash and wound.  Neurological: Denies dizziness, seizures, syncope, weakness, light-headedness, numbness and headaches.  Hematological: Denies adenopathy. Easy bruising, personal or family  bleeding history  Psychiatric/Behavioral: Denies suicidal ideation, mood changes, confusion, nervousness, sleep disturbance and agitation   Assessment and Plan:  Acute bilateral low back pain with right-sided sciatica  -I have explained to her how intermittent FMLA is for chronic conditions that may have acute flareups. -She is in the acute phase of this problem.  I am happy to write her a work note for the 2 days last week that she missed, she agrees to this. -Once we figure out her physical therapy schedule I can also write her out for those days. -I will see if we can get her into physical therapy sooner as I feel that this will help her get over her current flareup the quickest. -She is feeling improved today as compared to last week.  I discussed the assessment and treatment plan with the patient. The patient was provided an opportunity to ask questions and all were answered. The patient agreed with the plan and demonstrated an understanding of the instructions.   The patient was advised to call back or seek an in-person evaluation if the symptoms worsen or if the condition fails to improve as anticipated.  I provided 16 minutes of non-face-to-face time during this encounter.   Chaya Jan, MD [facility]

## 2019-10-04 ENCOUNTER — Telehealth: Payer: Self-pay | Admitting: *Deleted

## 2019-10-04 ENCOUNTER — Telehealth: Payer: Self-pay | Admitting: Family Medicine

## 2019-10-04 NOTE — Telephone Encounter (Signed)
Patients medical leave documents faxed on 10/03/2019. Confirmation received at 10:57 on 10/03/2019

## 2019-10-04 NOTE — Telephone Encounter (Signed)
Pt called need update on FMLA form Please call [phone]

## 2019-10-04 NOTE — Telephone Encounter (Signed)
FMLA completed for pt.  Signed and then given to MR.

## 2019-10-10 NOTE — Telephone Encounter (Signed)
I completed the FMLA (updated with 6 month time frame 09-21-19 thru 03-20-20).  Signed updated the form. To MR.

## 2019-11-01 ENCOUNTER — Ambulatory Visit: Payer: 59 | Attending: Internal Medicine | Admitting: Physical Therapy

## 2019-11-01 ENCOUNTER — Encounter: Payer: Self-pay | Admitting: Physical Therapy

## 2019-11-01 ENCOUNTER — Other Ambulatory Visit: Payer: Self-pay

## 2019-11-01 DIAGNOSIS — M5441 Lumbago with sciatica, right side: Secondary | ICD-10-CM

## 2019-11-01 DIAGNOSIS — R293 Abnormal posture: Secondary | ICD-10-CM | POA: Diagnosis present

## 2019-11-01 NOTE — Addendum Note (Signed)
Addended byDonita Brooks on: 11/01/2019 01:12 PM   Modules accepted: Orders

## 2019-11-01 NOTE — Patient Instructions (Signed)
Access Code: [email or web address]: [email or web address]: 09/16/2021Prepared by: [facility]  Supine 90/90 Sciatic Nerve Glide with Knee Flexion/Extension - 2 x daily - 7 x weekly - 10 reps - 3 seconds hold   [facility] [address] [place], [place] [address] Phone # [phone] Fax [phone]

## 2019-11-01 NOTE — Therapy (Signed)
[facility] [address] [place], [place], [address] Phone: [phone]   Fax:  [phone]  Physical Therapy Evaluation  Patient Details  Name: Kathleen Powell MRN: [id number] Date of Birth: 1985/07/16 Referring Provider (PT): Elizabeth Palau, MD    Encounter Date: 11/01/2019   PT End of Session - 11/01/19 1016    Visit Number 1    Date for PT Re-Evaluation 12/13/19    Authorization Type UHC    Authorization Time Period 11/01/19 to 12/13/19    PT Start Time 0931    PT Stop Time 1011    PT Time Calculation (min) 40 min    Activity Tolerance Patient tolerated treatment well;No increased pain    Behavior During Therapy WFL for tasks assessed/performed           Past Medical History:  Diagnosis Date   Migraine    Migraine    No pertinent past medical history    Tobacco abuse    Vertigo     Past Surgical History:  Procedure Laterality Date   CESAREAN SECTION  11/27/2011   Procedure: CESAREAN SECTION;  Surgeon: Kathreen Cosier, MD;  Location: [facility];  Service: Obstetrics;  Laterality: N/A;  Primary cesarean section with delivery of baby boy at 61. Apgars 8/9.    There were no vitals filed for this visit.    Subjective Assessment - 11/01/19 0932    Subjective Pt states that this past May she fell while running and her Rt leg came out in front of her. Immediately after, her knee was bothering her, but 4-5 days later her back started bothering her and she would notice shooting pain from the knee up to the side of the hip. She denies numbness and tingling. She hasn't had the intense pain for probably a week and a half. She will just randomly have a shooting pain up the leg while sitting at her desk. She works at home at her desk most of the day.    Pertinent History migraines    Limitations Sitting    How long can you sit comfortably? unsure    Patient Stated Goals improve pain    Currently in  Pain? No/denies    Pain Location Leg    Pain Orientation Right;Lateral    Pain Descriptors / Indicators Shooting;Aching    Pain Type Chronic pain    Pain Radiating Towards pain shoots up the side of the thigh to the buttock and across the low back    Pain Onset More than a month ago    Pain Frequency Intermittent    Aggravating Factors  sitting    Pain Relieving Factors alternating heat/ice- lasts 1 hour    Effect of Pain on Daily Activities limited sitting tolerance for work              [facility] PT Assessment - 11/01/19 0001      Assessment   Medical Diagnosis acute low back pain with sciatica     Referring Provider (PT) Elizabeth Palau, MD     Onset Date/Surgical Date --   May 2021   Prior Therapy none       Precautions   Precautions None      Balance Screen   Has the patient fallen in the past 6 months No    Has the patient had a decrease in activity level because of a fear of falling?  No    Is the patient reluctant to  leave their home because of a fear of falling?  No      Home [employer] information [employer] manager residence      Prior Function   Vocation Full time employment    Vocation Requirements sitting at a desk-working on getting an standing desk       Cognition   Overall Cognitive Status Within Functional Limits for tasks assessed      ROM / Strength   AROM / PROM / Strength AROM;Strength      AROM   AROM Assessment Site Lumbar    Lumbar Flexion x10 reps, no pain with this     Lumbar Extension WNL, pain free    Lumbar - Right Rotation WNL, no pain with this     Lumbar - Left Rotation WNL, no pain with this       Strength   Strength Assessment Site Hip;Knee    Right/Left Hip Right;Left    Right Hip Flexion 5/5    Right Hip Extension 5/5    Right Hip ABduction 5/5    Right/Left Knee Right;Left    Right Knee Flexion 4/5    Right Knee Extension 5/5    Left Knee Flexion 4/5    Left Knee Extension 5/5      Palpation   Spinal mobility  pain at L5 with spring testing upper lumbar spine       Special Tests   Other special tests (+) slump and passive SLR (30 deg) on Rt                      Objective measurements completed on examination: See above findings.       [facility] Adult PT Treatment/Exercise - 11/01/19 0001      Exercises   Exercises Lumbar      Lumbar Exercises: Stretches   Other Lumbar Stretch Exercise Rt sciatic nerve flossing 90/90 x10 reps                   PT Education - 11/01/19 1012    Education Details lumbar roll while seated; taking 5-10 minute break every hour during the day; sciatic nerve floss    Person(s) Educated Patient    Methods Explanation;Handout    Comprehension Verbalized understanding;Returned demonstration            PT Short Term Goals - 11/01/19 1003      PT SHORT TERM GOAL #1   Title Pt will be independent with her initial HEP to improve understanding of proper body mechanics.    Time 3    Period Weeks    Status New    Target Date 11/22/19             PT Long Term Goals - 11/01/19 1004      PT LONG TERM GOAL #1   Title Pt will report being able to complete a work week with adequat breaks and without low back pain/LE symptoms.    Time 6    Period Weeks    Status New      PT LONG TERM GOAL #2   Title Pt will have improved nerve mobility, evident by increase in passive straight leg raise to 45 deg without pain in the low back.    Time 6    Period Weeks    Status New      PT LONG TERM GOAL #3   Title Pt will demo understanding of proper body mechanics, evident by  her ability to complete liftig technique without the need for PT cuing.    Time 6    Period Weeks    Status New      PT LONG TERM GOAL #4   Title Pt will report atleast 60% improvement in her pain from the start of PT.    Time 6    Period Weeks    Status New                  Plan - 11/01/19 1234    Clinical Impression Statement Pt is a pleasant 34 y.o F referred  to OPPT with complaints of intermittent low back pain with Rt LE symptoms following a fall back in May. Pt's pain has improved in the past week and a half, but she notes intermittent shooting pain while seated throughout the day at her desk. She has no pain with active and repeated lumbar flexion, and her LE strength is within normal limits. Pt has positive slump and passive straight leg raise on the Rt. Pt presents with mild directional preference of extension. 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    Personal Factors and Comorbidities Age    Examination-Activity Limitations Sit    Stability/Clinical Decision Making Stable/Uncomplicated    Clinical Decision Making Low    Rehab Potential Excellent    PT Frequency 1x / week    PT Duration 6 weeks    PT Treatment/Interventions ADLs/Self Care Home Management;Therapeutic exercise;Therapeutic activities;Neuromuscular re-education;Spinal Manipulations;Dry needling;Passive range of motion;Manual techniques;Patient/family education    PT Next Visit Plan f/u on HEP; desk ergonimics (pt is looking into a standing desk); core stabilization; sciatic nerve flossing    PT Home Exercise Plan [facility]    Consulted and Agree with Plan of Care Patient           Patient will benefit from skilled therapeutic intervention in order to improve the following deficits and impairments:  Hypomobility, Decreased strength, Pain, Improper body mechanics, Postural dysfunction  Visit Diagnosis: Acute bilateral low back pain with right-sided sciatica  Abnormal posture     Problem List Patient Active Problem List   Diagnosis Date Noted   Tobacco abuse    Depo-Provera contraceptive status 11/01/2018   Continuous dependence on cigarette smoking 11/01/2018   Chronic migraine without aura without status  migrainosus, not intractable 12/29/2017   Migraine with aura and without status migrainosus, not intractable 12/29/2017    1:11 PM,11/01/19 Donita Brooks PT, DPT [facility]  [phone]  [facility] [address] [place], [place], [address] Phone: [phone]   Fax:  [phone]  Name: Liboria Putnam MRN: [id number] Date of Birth: 26-Dec-1985

## 2019-11-08 ENCOUNTER — Other Ambulatory Visit: Payer: Self-pay

## 2019-11-08 ENCOUNTER — Ambulatory Visit: Payer: 59

## 2019-11-08 DIAGNOSIS — M5441 Lumbago with sciatica, right side: Secondary | ICD-10-CM | POA: Diagnosis not present

## 2019-11-08 DIAGNOSIS — R293 Abnormal posture: Secondary | ICD-10-CM

## 2019-11-08 NOTE — Therapy (Signed)
[facility] [address] [place], [place], [address] Phone: [phone]   Fax:  [phone]  Physical Therapy Treatment  Patient Details  Name: Kathleen Powell MRN: [id number] Date of Birth: 06/06/1985 Referring Provider (PT): Elizabeth Palau, MD    Encounter Date: 11/08/2019   PT End of Session - 11/08/19 0927    Visit Number 2    Date for PT Re-Evaluation 12/13/19    Authorization Type UHC    PT Start Time 0846    PT Stop Time 0925    PT Time Calculation (min) 39 min    Activity Tolerance Patient tolerated treatment well;No increased pain    Behavior During Therapy WFL for tasks assessed/performed           Past Medical History:  Diagnosis Date  . Migraine   . Migraine   . No pertinent past medical history   . Tobacco abuse   . Vertigo     Past Surgical History:  Procedure Laterality Date  . CESAREAN SECTION  11/27/2011   Procedure: CESAREAN SECTION;  Surgeon: Kathreen Cosier, MD;  Location: [facility];  Service: Obstetrics;  Laterality: N/A;  Primary cesarean section with delivery of baby boy at 70. Apgars 8/9.    There were no vitals filed for this visit.   Subjective Assessment - 11/08/19 0848    Subjective The exercises have been helping.    Diagnostic tests x-ray: negative    Currently in Pain? Yes    Pain Score 5     Pain Location Leg    Pain Orientation Left    Pain Descriptors / Indicators Aching    Pain Onset More than a month ago    Pain Frequency Intermittent    Aggravating Factors  sitting    Pain Relieving Factors heat, ice, stretching                             [facility] Adult PT Treatment/Exercise - 11/08/19 0001      Lumbar Exercises: Stretches   Active Hamstring Stretch 3 reps;20 seconds    Single Knee to Chest Stretch 3 reps;20 seconds    Lower Trunk Rotation 3 reps;20 seconds    Piriformis Stretch 3 reps;20 seconds    Other Lumbar Stretch  Exercise Rt and Lt: sciatic nerve flossing 90/90 x10 reps       Lumbar Exercises: Sidelying   Clam 15 reps;Left;Right    Clam Limitations core activation      Manual Therapy   Manual Therapy Soft tissue mobilization;Myofascial release    Manual therapy comments bil lumbar and gluteals with Addaday- blue attachement                  PT Education - 11/08/19 0907    Education Details Access Code: [facility], DN info    Person(s) Educated Patient    Methods Explanation;Demonstration;Handout    Comprehension Verbalized understanding;Returned demonstration            PT Short Term Goals - 11/01/19 1003      PT SHORT TERM GOAL #1   Title Pt will be independent with her initial HEP to improve understanding of proper body mechanics.    Time 3    Period Weeks    Status New    Target Date 11/22/19             PT Long Term Goals - 11/01/19 1004  PT LONG TERM GOAL #1   Title Pt will report being able to complete a work week with adequat breaks and without low back pain/LE symptoms.    Time 6    Period Weeks    Status New      PT LONG TERM GOAL #2   Title Pt will have improved nerve mobility, evident by increase in passive straight leg raise to 45 deg without pain in the low back.    Time 6    Period Weeks    Status New      PT LONG TERM GOAL #3   Title Pt will demo understanding of proper body mechanics, evident by her ability to complete liftig technique without the need for PT cuing.    Time 6    Period Weeks    Status New      PT LONG TERM GOAL #4   Title Pt will report atleast 60% improvement in her pain from the start of PT.    Time 6    Period Weeks    Status New                 Plan - 11/08/19 0814    Clinical Impression Statement Pt with first time follow-up after evaluation.  Pt is performing initial HEP and demonstrated correctly.  PT added to HEP to include flexibility and core/hip strength.  Pt did well with all exercises without  increased pain.  Pt will change position frequently with work tasks.  Pt with tension in bil lumbar spine and Rt/Lt gluteals.  PT educated pt regarding dry needling and she will consider for next session.  Pt with reduced tension in the lumbar spine post session.  Pt will continue to benefit from skilled PT to address lumbar pain, core and hip strength and tissue mobility.    PT Frequency 1x / week    PT Duration 6 weeks    PT Treatment/Interventions ADLs/Self Care Home Management;Therapeutic exercise;Therapeutic activities;Neuromuscular re-education;Spinal Manipulations;Dry needling;Passive range of motion;Manual techniques;Patient/family education    PT Next Visit Plan review HEP, dry needling if pt agrees, core strength advancement    PT Home Exercise Plan [facility]    Consulted and Agree with Plan of Care Patient           Patient will benefit from skilled therapeutic intervention in order to improve the following deficits and impairments:  Hypomobility, Decreased strength, Pain, Improper body mechanics, Postural dysfunction  Visit Diagnosis: Abnormal posture  Acute bilateral low back pain with right-sided sciatica     Problem List Patient Active Problem List   Diagnosis Date Noted  . Tobacco abuse   . Depo-Provera contraceptive status 11/01/2018  . Continuous dependence on cigarette smoking 11/01/2018  . Chronic migraine without aura without status migrainosus, not intractable 12/29/2017  . Migraine with aura and without status migrainosus, not intractable 12/29/2017    Lorrene Reid, PT 11/08/19 9:30 AM  [facility] [address] [place], [place], [address] Phone: [phone]   Fax:  [phone]  Name: Kathleen Powell MRN: [id number] Date of Birth: Mar 13, 1985

## 2019-11-08 NOTE — Patient Instructions (Addendum)
Trigger Point Dry Needling  . What is Trigger Point Dry Needling (DN)? o DN is a physical therapy technique used to treat muscle pain and dysfunction. Specifically, DN helps deactivate muscle trigger points (muscle knots).  o A thin filiform needle is used to penetrate the skin and stimulate the underlying trigger point. The goal is for a local twitch response (LTR) to occur and for the trigger point to relax. No medication of any kind is injected during the procedure.   . What Does Trigger Point Dry Needling Feel Like?  o The procedure feels different for each individual patient. Some patients report that they do not actually feel the needle enter the skin and overall the process is not painful. Very mild bleeding may occur. However, many patients feel a deep cramping in the muscle in which the needle was inserted. This is the local twitch response.   Marland Kitchen How Will I feel after the treatment? o Soreness is normal, and the onset of soreness may not occur for a few hours. Typically this soreness does not last longer than two days.  o Bruising is uncommon, however; ice can be used to decrease any possible bruising.  o In rare cases feeling tired or nauseous after the treatment is normal. In addition, your symptoms may get worse before they get better, this period will typically not last longer than 24 hours.   . What Can I do After My Treatment? o Increase your hydration by drinking more water for the next 24 hours. o You may place ice or heat on the areas treated that have become sore, however, do not use heat on inflamed or bruised areas. Heat often brings more relief post needling. o You can continue your regular activities, but vigorous activity is not recommended initially after the treatment for 24 hours. o DN is best combined with other physical therapy such as strengthening, stretching, and other therapies.   Access Code: [facility] URL: [email or web address] Date:  11/08/2019 Prepared by: Tresa Endo  Exercises Supine 90/90 Sciatic Nerve Glide with Knee Flexion/Extension - 2 x daily - 7 x weekly - 10 reps - 3 seconds hold Supine Lower Trunk Rotation - 2-3 x daily - 7 x weekly - 1 sets - 3 reps - 20 hold Clamshell - 2 x daily - 7 x weekly - 1 sets - 10 reps Supine Single Knee to Chest Stretch - 2-3 x daily - 7 x weekly - 1 sets - 3 reps - 20 hold Seated Figure 4 Piriformis Stretch - 2-3 x daily - 7 x weekly - 1 sets - 3 reps - 20 hold Seated Hamstring Stretch - 2-3 x daily - 7 x weekly - 1 sets - 3 reps - 20 hold  [facility] [address], [address] [place], [place] [address] Phone # [phone] Fax [phone]

## 2019-11-15 ENCOUNTER — Other Ambulatory Visit: Payer: Self-pay

## 2019-11-15 ENCOUNTER — Ambulatory Visit: Payer: 59 | Admitting: Physical Therapy

## 2019-11-15 DIAGNOSIS — M5441 Lumbago with sciatica, right side: Secondary | ICD-10-CM | POA: Diagnosis not present

## 2019-11-15 DIAGNOSIS — R293 Abnormal posture: Secondary | ICD-10-CM

## 2019-11-15 NOTE — Patient Instructions (Signed)
Access Code: [facility] URL: [email or web address] Date: 11/15/2019 Prepared by: Lavinia Sharps  Exercises Supine 90/90 Sciatic Nerve Glide with Knee Flexion/Extension - 2 x daily - 7 x weekly - 10 reps - 3 seconds hold Supine Lower Trunk Rotation - 2-3 x daily - 7 x weekly - 1 sets - 3 reps - 20 hold Clamshell - 2 x daily - 7 x weekly - 1 sets - 10 reps Supine Single Knee to Chest Stretch - 2-3 x daily - 7 x weekly - 1 sets - 3 reps - 20 hold Seated Figure 4 Piriformis Stretch - 2-3 x daily - 7 x weekly - 1 sets - 3 reps - 20 hold Seated Hamstring Stretch - 2-3 x daily - 7 x weekly - 1 sets - 3 reps - 20 hold Supine Transversus Abdominis Bracing - Hands on Stomach - 1 x daily - 7 x weekly - 1 sets - 10 reps Hooklying Isometric Hip Flexion - 1 x daily - 7 x weekly - 1 sets - 10 reps Seated Transversus Abdominis Bracing - 1 x daily - 7 x weekly - 1 sets - 10 reps Standing Transverse Abdominis Contraction - 1 x daily - 7 x weekly - 1 sets - 10 reps Standing Isometric Hip Abduction with Ball on Wall - 1 x daily - 7 x weekly - 1 sets - 10 reps Standing Hip Hinge with Dowel - 1 x daily - 7 x weekly - 1 sets - 10 reps Half Dead Lift with Kettlebell - 1 x daily - 7 x weekly - 1 sets - 10 reps

## 2019-11-15 NOTE — Therapy (Signed)
[facility] [address] [place], [place], [address] Phone: [phone]   Fax:  [phone]  Physical Therapy Treatment  Patient Details  Name: Kathleen Powell MRN: [id number] Date of Birth: 1985/08/13 Referring Provider (PT): Elizabeth Palau, MD    Encounter Date: 11/15/2019   PT End of Session - 11/15/19 1737    Visit Number 3    Date for PT Re-Evaluation 12/13/19    Authorization Type UHC    Authorization Time Period 11/01/19 to 12/13/19    PT Start Time 0930    PT Stop Time 1010    PT Time Calculation (min) 40 min    Activity Tolerance Patient tolerated treatment well;No increased pain           Past Medical History:  Diagnosis Date   Migraine    Migraine    No pertinent past medical history    Tobacco abuse    Vertigo     Past Surgical History:  Procedure Laterality Date   CESAREAN SECTION  11/27/2011   Procedure: CESAREAN SECTION;  Surgeon: Kathreen Cosier, MD;  Location: [facility];  Service: Obstetrics;  Laterality: N/A;  Primary cesarean section with delivery of baby boy at 60. Apgars 8/9.    There were no vitals filed for this visit.   Subjective Assessment - 11/15/19 0932    Subjective No pain right now.  Getting better.  Last time felt back pain was Tuesday with sitting.    Currently in Pain? No/denies    Pain Score 0-No pain    Pain Location Back    Pain Orientation Left                             [facility] Adult PT Treatment/Exercise - 11/15/19 0001      Therapeutic Activites    Lifting hip hinge with golf club 15x; 10# half dead lifts 2x10      Lumbar Exercises: Standing   Other Standing Lumbar Exercises ball on wall hip abduction isometrics 5x 5 sec hold       Lumbar Exercises: Supine   Ab Set 5 reps    Isometric Hip Flexion 10 reps      Lumbar Exercises: Quadruped   Single Arm Raise Right;Left;5 reps    Straight Leg Raise 5 reps    Opposite  Arm/Leg Raise Right arm/Left leg;Left arm/Right leg;5 reps                  PT Education - 11/15/19 1736    Education Details transverse abdominus supine sit stand; wall ball hip abduction isometrics; hip hinge dowel, half dead lifts 10#    Person(s) Educated Patient    Methods Explanation;Demonstration;Handout    Comprehension Returned demonstration;Verbalized understanding            PT Short Term Goals - 11/01/19 1003      PT SHORT TERM GOAL #1   Title Pt will be independent with her initial HEP to improve understanding of proper body mechanics.    Time 3    Period Weeks    Status New    Target Date 11/22/19             PT Long Term Goals - 11/01/19 1004      PT LONG TERM GOAL #1   Title Pt will report being able to complete a work week with adequat breaks and without low back pain/LE symptoms.  Time 6    Period Weeks    Status New      PT LONG TERM GOAL #2   Title Pt will have improved nerve mobility, evident by increase in passive straight leg raise to 45 deg without pain in the low back.    Time 6    Period Weeks    Status New      PT LONG TERM GOAL #3   Title Pt will demo understanding of proper body mechanics, evident by her ability to complete liftig technique without the need for PT cuing.    Time 6    Period Weeks    Status New      PT LONG TERM GOAL #4   Title Pt will report atleast 60% improvement in her pain from the start of PT.    Time 6    Period Weeks    Status New                 Plan - 11/15/19 9983    Clinical Impression Statement The patient is progressing well with decreased pain frequency and intensity.  She is able to perform a progression of lumbo/pelvic/hip strengthening ex's in loaded positions without production of back pain.  She is receptive to dead lifting with quick achievement of the hip hinge method.  Discussed return to running when she has had 1-2 "good weeks" and continuation of progressive strengthening  to reduce re-injury risk.  Therapist monitoring response with all and providing verbal cues for technique.    Rehab Potential Excellent    PT Frequency 1x / week    PT Duration 6 weeks    PT Treatment/Interventions ADLs/Self Care Home Management;Therapeutic exercise;Therapeutic activities;Neuromuscular re-education;Spinal Manipulations;Dry needling;Passive range of motion;Manual techniques;Patient/family education    PT Next Visit Plan intermediate level lumbo/pelvic/hip strengthening; 10# dead lifts; defers DN    PT Home Exercise Plan [facility]           Patient will benefit from skilled therapeutic intervention in order to improve the following deficits and impairments:  Hypomobility, Decreased strength, Pain, Improper body mechanics, Postural dysfunction  Visit Diagnosis: Abnormal posture  Acute bilateral low back pain with right-sided sciatica     Problem List Patient Active Problem List   Diagnosis Date Noted   Tobacco abuse    Depo-Provera contraceptive status 11/01/2018   Continuous dependence on cigarette smoking 11/01/2018   Chronic migraine without aura without status migrainosus, not intractable 12/29/2017   Migraine with aura and without status migrainosus, not intractable 12/29/2017   Kathleen Powell, PT 11/15/19 5:43 PM Phone: [phone] Fax: [phone] Vivien Presto 11/15/2019, 5:42 PM  [facility] [address] [place], [place], [address] Phone: [phone]   Fax:  [phone]  Name: Kathleen Powell MRN: [id number] Date of Birth: [birth year]

## 2019-11-22 ENCOUNTER — Ambulatory Visit: Payer: 59 | Attending: Internal Medicine | Admitting: Physical Therapy

## 2019-11-22 ENCOUNTER — Other Ambulatory Visit: Payer: Self-pay

## 2019-11-22 DIAGNOSIS — M5441 Lumbago with sciatica, right side: Secondary | ICD-10-CM

## 2019-11-22 DIAGNOSIS — R293 Abnormal posture: Secondary | ICD-10-CM

## 2019-11-22 NOTE — Patient Instructions (Signed)
Access Code: [facility] URL: [email or web address] Date: 11/22/2019 Prepared by: Lavinia Sharps  Exercises Supine 90/90 Sciatic Nerve Glide with Knee Flexion/Extension - 2 x daily - 7 x weekly - 10 reps - 3 seconds hold Supine Lower Trunk Rotation - 2-3 x daily - 7 x weekly - 1 sets - 3 reps - 20 hold Clamshell - 2 x daily - 7 x weekly - 1 sets - 10 reps Supine Single Knee to Chest Stretch - 2-3 x daily - 7 x weekly - 1 sets - 3 reps - 20 hold Seated Figure 4 Piriformis Stretch - 2-3 x daily - 7 x weekly - 1 sets - 3 reps - 20 hold Seated Hamstring Stretch - 2-3 x daily - 7 x weekly - 1 sets - 3 reps - 20 hold Supine Transversus Abdominis Bracing - Hands on Stomach - 1 x daily - 7 x weekly - 1 sets - 10 reps Hooklying Isometric Hip Flexion - 1 x daily - 7 x weekly - 1 sets - 10 reps Seated Transversus Abdominis Bracing - 1 x daily - 7 x weekly - 1 sets - 10 reps Standing Transverse Abdominis Contraction - 1 x daily - 7 x weekly - 1 sets - 10 reps Standing Isometric Hip Abduction with Ball on Wall - 1 x daily - 7 x weekly - 1 sets - 10 reps Standing Hip Hinge with Dowel - 1 x daily - 7 x weekly - 1 sets - 10 reps Half Dead Lift with Kettlebell - 1 x daily - 7 x weekly - 1 sets - 10 reps Kneeling Plank with Feet on Ground - 1 x daily - 7 x weekly - 1 sets - 5 reps - 5 hold Side Plank on Knees - 1 x daily - 7 x weekly - 1 sets - 5 reps - 5 hold Standing Row with Anchored Resistance - 1 x daily - 7 x weekly - 3 sets - 10 reps Shoulder extension with resistance - Neutral - 1 x daily - 7 x weekly - 3 sets - 10 reps Standing Anti-Rotation Press with Anchored Resistance - 1 x daily - 7 x weekly - 1 sets - 10 reps

## 2019-11-22 NOTE — Therapy (Signed)
[facility] [address], [address] [place], [place], [address] Phone: [phone]   Fax:  [phone]  Physical Therapy Treatment  Patient Details  Name: Kathleen Powell MRN: [id number] Date of Birth: 1985/07/08 Referring Provider (PT): Elizabeth Palau, MD    Encounter Date: 11/22/2019   PT End of Session - 11/22/19 1728    Visit Number 4    Date for PT Re-Evaluation 12/13/19    Authorization Type UHC    Authorization Time Period 11/01/19 to 12/13/19    PT Start Time 0845    PT Stop Time 0927    PT Time Calculation (min) 42 min    Activity Tolerance Patient tolerated treatment well           Past Medical History:  Diagnosis Date  . Migraine   . Migraine   . No pertinent past medical history   . Tobacco abuse   . Vertigo     Past Surgical History:  Procedure Laterality Date  . CESAREAN SECTION  11/27/2011   Procedure: CESAREAN SECTION;  Surgeon: Kathreen Cosier, MD;  Location: [facility];  Service: Obstetrics;  Laterality: N/A;  Primary cesarean section with delivery of baby boy at 78. Apgars 8/9.    There were no vitals filed for this visit.   Subjective Assessment - 11/22/19 0844    Subjective Feeling OK today.  Work was rough on Monday b/c the system went down.  Last had pain while sitting watching TV.  I loved what we did last time.    Currently in Pain? No/denies    Pain Score 0-No pain    Pain Location Back                             [facility] Adult PT Treatment/Exercise - 11/22/19 0001      Therapeutic Activites    Lifting dead lifts 15# and 20# 10x each       Lumbar Exercises: Stretches   Other Lumbar Stretch Exercise 2nd step hip flexor with UE movements      Lumbar Exercises: Aerobic   Nustep L1 5 min while discuss progress       Lumbar Exercises: Standing   Row Strengthening;Both;15 reps;Theraband    Theraband Level (Row) Level 3 (Green)    Shoulder Extension  Strengthening;Both;15 reps;Theraband    Theraband Level (Shoulder Extension) Level 3 (Green)    Other Standing Lumbar Exercises Pallof series red band: press forward, up, out and up; march, touch back     Other Standing Lumbar Exercises single leg dead lifts with foot on wall 10x right/left       Lumbar Exercises: Sidelying   Other Sidelying Lumbar Exercises side planks on knees 3x 5 sec holds       Lumbar Exercises: Prone   Other Prone Lumbar Exercises modified planks on knees 5x 5 sec holds       Lumbar Exercises: Quadruped   Opposite Arm/Leg Raise Right arm/Left leg;Left arm/Right leg;5 reps    Opposite Arm/Leg Raise Limitations with foam roll                   PT Education - 11/22/19 1726    Education Details band rows, extensions; counter push ups; modified planks    Person(s) Educated Patient    Methods Explanation;Demonstration;Handout    Comprehension Returned demonstration;Verbalized understanding            PT Short Term  Goals - 11/22/19 1743      PT SHORT TERM GOAL #1   Title Pt will be independent with her initial HEP to improve understanding of proper body mechanics.    Status Achieved             PT Long Term Goals - 11/01/19 1004      PT LONG TERM GOAL #1   Title Pt will report being able to complete a work week with adequat breaks and without low back pain/LE symptoms.    Time 6    Period Weeks    Status New      PT LONG TERM GOAL #2   Title Pt will have improved nerve mobility, evident by increase in passive straight leg raise to 45 deg without pain in the low back.    Time 6    Period Weeks    Status New      PT LONG TERM GOAL #3   Title Pt will demo understanding of proper body mechanics, evident by her ability to complete liftig technique without the need for PT cuing.    Time 6    Period Weeks    Status New      PT LONG TERM GOAL #4   Title Pt will report atleast 60% improvement in her pain from the start of PT.    Time 6     Period Weeks    Status New                 Plan - 11/22/19 0919    Clinical Impression Statement The patient is responding well to lumbo/pelvic/hip core strengthening exercise.  Good carryover with hip hinge  and dead lifting with a progression to 15# and 20# weights.  She denies LBP with even more challenging ex's but does fatigue quickly.  Therapist monitoring response with all interventions.    Rehab Potential Excellent    PT Frequency 1x / week    PT Duration 6 weeks    PT Treatment/Interventions ADLs/Self Care Home Management;Therapeutic exercise;Therapeutic activities;Neuromuscular re-education;Spinal Manipulations;Dry needling;Passive range of motion;Manual techniques;Patient/family education    PT Next Visit Plan intermediate level lumbo/pelvic/hip strengthening; 20# dead lifts; defers DN    PT Home Exercise Plan [facility]           Patient will benefit from skilled therapeutic intervention in order to improve the following deficits and impairments:  Hypomobility, Decreased strength, Pain, Improper body mechanics, Postural dysfunction  Visit Diagnosis: Abnormal posture  Acute bilateral low back pain with right-sided sciatica     Problem List Patient Active Problem List   Diagnosis Date Noted  . Tobacco abuse   . Depo-Provera contraceptive status 11/01/2018  . Continuous dependence on cigarette smoking 11/01/2018  . Chronic migraine without aura without status migrainosus, not intractable 12/29/2017  . Migraine with aura and without status migrainosus, not intractable 12/29/2017   Lavinia Sharps, PT 11/22/19 5:44 PM Phone: [phone] Fax: [phone] Vivien Presto 11/22/2019, 5:44 PM  [facility] [address] [place], [place], [address] Phone: [phone]   Fax:  [phone]  Name: Kathleen Powell MRN: [id number] Date of Birth: 09/13/1985

## 2019-11-27 ENCOUNTER — Telehealth: Payer: Self-pay | Admitting: Family Medicine

## 2019-11-27 ENCOUNTER — Other Ambulatory Visit: Payer: Self-pay | Admitting: *Deleted

## 2019-11-27 MED ORDER — PROPRANOLOL HCL ER 120 MG PO CP24: 120.0000 mg | ORAL_CAPSULE | Freq: Every day | ORAL | 2 refills | Status: DC

## 2019-11-27 NOTE — Telephone Encounter (Signed)
Pt called stating she is needing a 3 month supply for her propranolol ER (INDERAL LA) 120 MG 24 hr capsule and have it sent to the [facility] on [address]. Due to the [facility] she normally goes to not being able to fill her Rx any longer. Please advise.

## 2019-11-27 NOTE — Telephone Encounter (Signed)
E-scribed refill as requested. 

## 2019-11-29 ENCOUNTER — Other Ambulatory Visit: Payer: Self-pay

## 2019-11-29 ENCOUNTER — Ambulatory Visit: Payer: 59 | Admitting: Physical Therapy

## 2019-11-29 DIAGNOSIS — R293 Abnormal posture: Secondary | ICD-10-CM | POA: Diagnosis not present

## 2019-11-29 DIAGNOSIS — M5441 Lumbago with sciatica, right side: Secondary | ICD-10-CM

## 2019-11-29 NOTE — Therapy (Signed)
[facility] [address], [address] [place], [place], [address] Phone: [phone]   Fax:  [phone]  Physical Therapy Treatment  Patient Details  Name: Kathleen Powell MRN: [id number] Date of Birth: 05-Jul-1985 Referring Provider (PT): Elizabeth Palau, MD    Encounter Date: 11/29/2019   PT End of Session - 11/29/19 1600    Visit Number 5    Date for PT Re-Evaluation 12/13/19    Authorization Type UHC    Authorization Time Period 11/01/19 to 12/13/19    PT Start Time 1517    PT Stop Time 1555    PT Time Calculation (min) 38 min    Activity Tolerance Patient tolerated treatment well           Past Medical History:  Diagnosis Date  . Migraine   . Migraine   . No pertinent past medical history   . Tobacco abuse   . Vertigo     Past Surgical History:  Procedure Laterality Date  . CESAREAN SECTION  11/27/2011   Procedure: CESAREAN SECTION;  Surgeon: Kathreen Cosier, MD;  Location: [facility];  Service: Obstetrics;  Laterality: N/A;  Primary cesarean section with delivery of baby boy at 56. Apgars 8/9.    There were no vitals filed for this visit.   Subjective Assessment - 11/29/19 1520    Subjective I had a little pain Monday and Wednesday.  I got my kettlebell for home 10#.  I've been working out with it.  I feel good during my workouts.  I haven't picked up my 20-30# neice yet.    How long can you sit comfortably? 1 1/2 hours    Diagnostic tests x-ray: negative    Patient Stated Goals improve pain    Currently in Pain? No/denies    Pain Score 0-No pain    Pain Location Back    Pain Radiating Towards no more symptoms in thigh or buttock    Aggravating Factors  sitting;  being on feet a long time.                             [facility] Adult PT Treatment/Exercise - 11/29/19 0001      Therapeutic Activites    Lifting dead lifts 15# and 25# 10x each       Lumbar Exercises: Stretches    Press Ups 10 reps    Other Lumbar Stretch Exercise childs pose     Other Lumbar Stretch Exercise 1/2 kneel hip flexor stretch 5x right/left       Lumbar Exercises: Aerobic   Nustep L3 5 min while discuss progress       Lumbar Exercises: Standing   Other Standing Lumbar Exercises wall slides with 5 sec holds 15x       Lumbar Exercises: Supine   Large Ball Oblique Isometric Limitations holding red ball UE/LE lift offs 2 sets of 5       Lumbar Exercises: Prone   Other Prone Lumbar Exercises over 1 pillow multifidi press with knee flexion and hip extension 5x each right/left     Other Prone Lumbar Exercises tall kneel with 10# kettle bell with gluteal squeeze 10x       Lumbar Exercises: Quadruped   Other Quadruped Lumbar Exercises 1/2 kneel with red band diagonals, press, rotation right/left                     PT Short  Term Goals - 11/22/19 1743      PT SHORT TERM GOAL #1   Title Pt will be independent with her initial HEP to improve understanding of proper body mechanics.    Status Achieved             PT Long Term Goals - 11/29/19 1605      PT LONG TERM GOAL #1   Title Pt will report being able to complete a work week with adequat breaks and without low back pain/LE symptoms.    Time 6    Period Weeks    Status On-going      PT LONG TERM GOAL #2   Title Pt will have improved nerve mobility, evident by increase in passive straight leg raise to 45 deg without pain in the low back.    Time 6    Period Weeks    Status On-going      PT LONG TERM GOAL #3   Title Pt will demo understanding of proper body mechanics, evident by her ability to complete liftig technique without the need for PT cuing.    Status Achieved      PT LONG TERM GOAL #4   Title Pt will report atleast 60% improvement in her pain from the start of PT.    Status Achieved                 Plan - 11/29/19 1601    Clinical Impression Statement The patient rates her overall progress at  70-80% better. No more peripheral symptoms and intermittent LBP only.   She is able to demonstrate good technique with dead lifts without complaint of low back pain.  She is able to peform more challenging lumbo/pelvic/hip strengthening ex's in kneeling and standing positions with muscular fatigue but no pain.  We discussed continuation of her HEP and will follow up in 2 weeks.  Will recheck progress toward goals for possible discharge in 1-3 visits.    Rehab Potential Excellent    PT Frequency 1x / week    PT Duration 6 weeks    PT Treatment/Interventions ADLs/Self Care Home Management;Therapeutic exercise;Therapeutic activities;Neuromuscular re-education;Spinal Manipulations;Dry needling;Passive range of motion;Manual techniques;Patient/family education    PT Next Visit Plan follow up in 2 weeks; check remaining goals for possible discharge; check SLR; do FOTO; core strengthening    PT Home Exercise Plan [facility]           Patient will benefit from skilled therapeutic intervention in order to improve the following deficits and impairments:  Hypomobility, Decreased strength, Pain, Improper body mechanics, Postural dysfunction  Visit Diagnosis: Abnormal posture  Acute bilateral low back pain with right-sided sciatica     Problem List Patient Active Problem List   Diagnosis Date Noted  . Tobacco abuse   . Depo-Provera contraceptive status 11/01/2018  . Continuous dependence on cigarette smoking 11/01/2018  . Chronic migraine without aura without status migrainosus, not intractable 12/29/2017  . Migraine with aura and without status migrainosus, not intractable 12/29/2017   Lavinia Sharps, PT 11/29/19 4:08 PM Phone: [phone] Fax: [phone] Vivien Presto 11/29/2019, 4:08 PM  [facility] [address] [place], [place], [address] Phone: [phone]   Fax:  [phone]  Name: Kathleen Powell MRN:  [id number] Date of Birth: 19-Dec-1985

## 2019-12-14 ENCOUNTER — Encounter: Payer: 59 | Admitting: Physical Therapy

## 2019-12-18 ENCOUNTER — Other Ambulatory Visit: Payer: Self-pay

## 2019-12-18 ENCOUNTER — Ambulatory Visit: Payer: 59 | Attending: Internal Medicine | Admitting: Physical Therapy

## 2019-12-18 DIAGNOSIS — R293 Abnormal posture: Secondary | ICD-10-CM | POA: Diagnosis present

## 2019-12-18 DIAGNOSIS — M5441 Lumbago with sciatica, right side: Secondary | ICD-10-CM | POA: Insufficient documentation

## 2019-12-18 NOTE — Therapy (Signed)
[facility] [address] [place], [place], [address] Phone: [phone]   Fax:  [phone]  Physical Therapy Treatment/Recertification/Discharge Summary  Patient Details  Name: Kathleen Powell MRN: [id number] Date of Birth: [birth year] Referring Provider (PT): Thersa Salt, MD    Encounter Date: 12/18/2019   PT End of Session - 12/18/19 0905    Visit Number 6    Date for PT Re-Evaluation 01/06/20    Authorization Type [employer]    PT Start Time 6153    PT Stop Time 0858   discharge visit   PT Time Calculation (min) 23 min    Activity Tolerance Patient tolerated treatment well           Past Medical History:  Diagnosis Date  . Migraine   . Migraine   . No pertinent past medical history   . Tobacco abuse   . Vertigo     Past Surgical History:  Procedure Laterality Date  . CESAREAN SECTION  11/27/2011   Procedure: CESAREAN SECTION;  Surgeon: Frederico Hamman, MD;  Location: [facility];  Service: Obstetrics;  Laterality: N/A;  Primary cesarean section with delivery of baby boy at 77. Apgars 8/9.    There were no vitals filed for this visit.   Subjective Assessment - 12/18/19 0837    Subjective Feeling good.  No pain last week at work.   Still using her kettlebell at home.  Was able to bowl last week at her son's party.    Currently in Pain? No/denies    Pain Score 0-No pain              [facility] PT Assessment - 12/18/19 0001      Observation/Other Assessments   Focus on Therapeutic Outcomes (FOTO)  17% limitation      AROM   Lumbar Flexion 65    Lumbar Extension 40    Lumbar - Right Side Bend WFL    Lumbar - Left Side Bend WFLs      Strength   Right Hip Flexion 5/5    Right Hip Extension 5/5    Right Hip ABduction 5/5    Right Knee Flexion 5/5    Right Knee Extension 5/5    Left Knee Flexion 5/5    Left Knee Extension 5/5      Special Tests   Other special tests -SLR; -Slump                           [facility] Adult PT Treatment/Exercise - 12/18/19 0001      Lumbar Exercises: Standing   Other Standing Lumbar Exercises review of comprehensive HEP to discuss how to safely progress not only with reps and resistance but also with single leg standing, kneeling position variations                     PT Short Term Goals - 12/18/19 0850      PT SHORT TERM GOAL #1   Title Pt will be independent with her initial HEP to improve understanding of proper body mechanics.    Status Achieved             PT Long Term Goals - 12/18/19 0849      PT LONG TERM GOAL #1   Title Pt will report being able to complete a work week with adequat breaks and without low back pain/LE symptoms.    Status Achieved  PT LONG TERM GOAL #2   Title Pt will have improved nerve mobility, evident by increase in passive straight leg raise to 45 deg without pain in the low back.    Status Achieved      PT LONG TERM GOAL #3   Title Pt will demo understanding of proper body mechanics, evident by her ability to complete liftig technique without the need for PT cuing.    Status Achieved      PT LONG TERM GOAL #4   Title Pt will report atleast 60% improvement in her pain from the start of PT.    Baseline 80% better    Status Achieved                 Plan - 12/18/19 0851    Clinical Impression Statement The patient rates her overall progress at 80%.  Full and painfree lumbar ROM.  5/5 lumbo/pelvic/hip core and knee strength.  Negative SLR and slump test neural signs.  She demonstrates excellent carryover with HEP and lifting technique using the hip hinge method.  She has met all rehab goals and is in agreement of readiness for discharge from PT at this time.    Personal Factors and Comorbidities Age    Examination-Activity Limitations Sit    Rehab Potential Excellent    PT Frequency 1x / week    PT Duration 2 weeks    PT Treatment/Interventions ADLs/Self Care Home  Management;Therapeutic exercise;Therapeutic activities;Neuromuscular re-education;Spinal Manipulations;Dry needling;Passive range of motion;Manual techniques;Patient/family education    PT Next Visit Plan discharge visit    PT Home Exercise Plan [facility]           Patient will benefit from skilled therapeutic intervention in order to improve the following deficits and impairments:  Hypomobility, Decreased strength, Pain, Improper body mechanics, Postural dysfunction  Visit Diagnosis: Abnormal posture - Plan: PT plan of care cert/re-cert  Acute bilateral low back pain with right-sided sciatica - Plan: PT plan of care cert/re-cert     Problem List Patient Active Problem List   Diagnosis Date Noted  . Tobacco abuse   . Depo-Provera contraceptive status 11/01/2018  . Continuous dependence on cigarette smoking 11/01/2018  . Chronic migraine without aura without status migrainosus, not intractable 12/29/2017  . Migraine with aura and without status migrainosus, not intractable 12/29/2017   Ruben Im, PT 12/18/19 9:13 AM Phone: [phone] Fax: [phone] Kathleen Powell 12/18/2019, 9:12 AM  [facility] [address] [place], [place], [address] Phone: [phone]   Fax:  [phone]  Name: Kathleen Powell MRN: [id number] Date of Birth: March 30, 1985

## 2019-12-25 ENCOUNTER — Encounter: Payer: 59 | Admitting: Physical Therapy

## 2020-02-18 ENCOUNTER — Ambulatory Visit (INDEPENDENT_AMBULATORY_CARE_PROVIDER_SITE_OTHER): Payer: 59 | Admitting: Family Medicine

## 2020-02-18 ENCOUNTER — Encounter: Payer: Self-pay | Admitting: Family Medicine

## 2020-02-18 VITALS — BP 104/67 | HR 75 | Ht 63.0 in | Wt 162.0 lb

## 2020-02-18 DIAGNOSIS — G43709 Chronic migraine without aura, not intractable, without status migrainosus: Secondary | ICD-10-CM | POA: Diagnosis not present

## 2020-02-18 MED ORDER — SUMATRIPTAN SUCCINATE 50 MG PO TABS: 50.0000 mg | ORAL_TABLET | ORAL | 11 refills | Status: DC | PRN

## 2020-02-18 MED ORDER — PROPRANOLOL HCL ER 120 MG PO CP24: 120.0000 mg | ORAL_CAPSULE | Freq: Every day | ORAL | 3 refills | Status: DC

## 2020-02-18 NOTE — Progress Notes (Addendum)
PATIENT: Kathleen Powell DOB: 1985/10/01  REASON FOR VISIT: follow up HISTORY FROM: patient  Chief Complaint  Patient presents with  . Follow-up    Migraine fu, rm 1, alone, pt states she is doing well      HISTORY OF PRESENT ILLNESS: Today 02/18/20   She has restarted propranolol. She reports that migraines have decreased in frequency and intensity. She may have 1-2 migraines per month. Sumatriptan works well for abortive therapy. She denies adverse effects from medicaitons. She has been accepted for a new job and is excited about that. She is feeling well today and without complaints.   History (copied from previous notes)  Kathleen Powell is a 35 y.o. female here today for follow up for migraines. She was previously on propranolol 120mg  daily and rizatriptan for abortive therapy. She was last seen in 12/2017. She reports that she discontinued the medication around 12/2018. In 03/2019, she noted that headaches were worsened. She is now having about 7-8 headache days per month. At least 2-3 are migraines. She has unilateral throbbing pain and she has light and sound sensitivity. She denies nausea. Migraines can last 24 hours. Rizatriptan was not effective in aborting headache. She takes ibuprofen on occasion but not on regular basis. She recently started wearing rx glassess and is hoping this will help.   HISTORY: (copied from Dr Cathren Laine note on 12/29/2017)  HPI:  Kathleen Powell is a 35 y.o. female here as requested by Dr. Jodi Mourning for headaches. Headaches started at the age of 79. Worsening this year in the setting of stress. Can be unilateral but anywhere, behind the eyes, throbbing, severe, can't move, feels dizzy, +photophobia. A dark quiet room helps. Can last 24-72 hours. 25 headaches days a month for > 6 months, 20 are migrainous 3 days are severe and the moderately severe. She can have some spots in the vision during the headache but not always. She wakes with headaches. Headaches  are positional. Sleep is a trigger too much, stress can make it worse. She has vision changes. Also experiences vertigo, room spinning as well as dizziness/imbalance. No other focal neurologic deficits, associated symptoms, inciting events or modifiable factors.  Reviewed notes, labs and imaging from outside physicians, which showed:  Patient reports she has recent routine labwork done (cmp,cbc,tsh) will request   REVIEW OF SYSTEMS: Out of a complete 14 system review of symptoms, the patient complains only of the following symptoms, headaches and all other reviewed systems are negative.  ALLERGIES: No Known Allergies  HOME MEDICATIONS: Outpatient Medications Prior to Visit  Medication Sig Dispense Refill  . cyclobenzaprine (FLEXERIL) 10 MG tablet Take 1 tablet (10 mg total) by mouth 2 (two) times daily as needed for muscle spasms. 20 tablet 0  . ibuprofen (ADVIL,MOTRIN) 800 MG tablet Take 1 tablet (800 mg total) by mouth every 8 (eight) hours as needed. 30 tablet 5  . propranolol ER (INDERAL LA) 120 MG 24 hr capsule Take 1 capsule (120 mg total) by mouth daily. 90 capsule 2  . SUMAtriptan (IMITREX) 50 MG tablet Take 1 tablet (50 mg total) by mouth every 2 (two) hours as needed for migraine. May repeat in 2 hours if headache persists or recurs. 10 tablet 0  . medroxyPROGESTERone (DEPO-PROVERA) 150 MG/ML injection Inject 1 mL (150 mg total) into the muscle every 3 (three) months. 1 mL 2  . predniSONE (STERAPRED UNI-PAK 21 TAB) 10 MG (21) TBPK tablet 6 tabs for 1 day, then 5 tabs for 1 das,  then 4 tabs for 1 day, then 3 tabs for 1 day, 2 tabs for 1 day, then 1 tab for 1 day 21 tablet 0  . traMADol (ULTRAM) 50 MG tablet Take 1 tablet (50 mg total) by mouth every 12 (twelve) hours as needed. 12 tablet 0   No facility-administered medications prior to visit.    PAST MEDICAL HISTORY: Past Medical History:  Diagnosis Date  . Migraine   . Migraine   . No pertinent past medical history   .  Tobacco abuse   . Vertigo     PAST SURGICAL HISTORY: Past Surgical History:  Procedure Laterality Date  . CESAREAN SECTION  11/27/2011   Procedure: CESAREAN SECTION;  Surgeon: Kathreen Cosier, MD;  Location: [facility];  Service: Obstetrics;  Laterality: N/A;  Primary cesarean section with delivery of baby boy at 59. Apgars 8/9.    FAMILY HISTORY: Family History  Problem Relation Age of Onset  . Hypertension Mother   . Cancer Mother        thyroid  . Heart disease Father        Visual merchandiser  . Heart disease Maternal Grandmother        congestive heart failure  . Hypertension Maternal Grandmother   . Heart attack Maternal Grandmother   . Asthma Sister   . Gout Maternal Grandfather   . Colon cancer Paternal Grandfather   . Colon cancer Paternal Uncle   . Anesthesia problems Neg Hx     SOCIAL HISTORY: Social History   Socioeconomic History  . Marital status: Single    Spouse name: Not on file  . Number of children: Not on file  . Years of education: Not on file  . Highest education level: Some college, no degree  Occupational History  . Not on file  Tobacco Use  . Smoking status: Current Every Day Smoker    Packs/day: 0.25    Types: Cigarettes    Last attempt to quit: 09/09/2011    Years since quitting: 8.4  . Smokeless tobacco: Never Used  Vaping Use  . Vaping Use: Never used  Substance and Sexual Activity  . Alcohol use: Yes    Alcohol/week: 0.0 standard drinks    Comment: occasional  . Drug use: No  . Sexual activity: Yes    Birth control/protection: Injection  Other Topics Concern  . Not on file  Social History Narrative   Lives at home with her mother & son   Right handed   Caffeine: 3 cups a week    Social Determinants of Health   Financial Resource Strain: Not on file  Food Insecurity: Not on file  Transportation Needs: Not on file  Physical Activity: Not on file  Stress: Not on file  Social Connections: Not on file  Intimate Partner Violence:  Not on file      PHYSICAL EXAM  Vitals:   02/18/20 0826  BP: 104/67  Pulse: 75  Weight: 162 lb (73.5 kg)  Height: 5\' 3"  (1.6 m)   Body mass index is 28.7 kg/m.  Generalized: Well developed, in no acute distress  Cardiology: normal rate and rhythm, no murmur noted Respiratory: clear to auscultation bilaterally  Neurological examination  Mentation: Alert oriented to time, place, history taking. Follows all commands speech and language fluent Cranial nerve II-XII: Pupils were equal round reactive to light. Extraocular movements were full, visual field were full on confrontational test. Facial sensation and strength were normal.  Head turning and shoulder shrug  were normal and symmetric. Motor: The motor testing reveals 5 over 5 strength of all 4 extremities. Good symmetric motor tone is noted throughout.  Gait and station: Gait is normal.    DIAGNOSTIC DATA (LABS, IMAGING, TESTING) - I reviewed patient records, labs, notes, testing and imaging myself where available.  No flowsheet data found.   Lab Results  Component Value Date   WBC 4.7 08/29/2019   HGB 13.2 08/29/2019   HCT 40.1 08/29/2019   MCV 92 08/29/2019   PLT 267 08/29/2019      Component Value Date/Time   NA 141 08/29/2019 1036   K 4.1 08/29/2019 1036   CL 106 08/29/2019 1036   CO2 22 08/29/2019 1036   GLUCOSE 83 08/29/2019 1036   BUN 12 08/29/2019 1036   CREATININE 0.80 08/29/2019 1036   CALCIUM 9.3 08/29/2019 1036   PROT 7.2 08/29/2019 1036   ALBUMIN 4.5 08/29/2019 1036   AST 40 08/29/2019 1036   ALT 49 (H) 08/29/2019 1036   ALKPHOS 61 08/29/2019 1036   BILITOT 0.6 08/29/2019 1036   GFRNONAA 95 08/29/2019 1036   GFRAA 110 08/29/2019 1036   Lab Results  Component Value Date   CHOL 138 08/29/2019   HDL 50 08/29/2019   LDLCALC 77 08/29/2019   TRIG 51 08/29/2019   CHOLHDL 2.8 08/29/2019   Lab Results  Component Value Date   HGBA1C 5.9 (H) 08/29/2019   Lab Results  Component Value Date    VITAMINB12 385 08/29/2019   Lab Results  Component Value Date   TSH 1.320 08/29/2019     ASSESSMENT AND PLAN 34 y.o. year old female  has a past medical history of Migraine, Migraine, No pertinent past medical history, Tobacco abuse, and Vertigo. here with     ICD-10-CM   1. Chronic migraine without aura without status migrainosus, not intractable  G43.709      Everli reports migraines are much less frequent and severe on propranolol. Sumatriptan works well for abortive therapy. She will continue current treatment plan. She was encouraged to focus on adequate hydration and healthy lifestyle habits. She will follow up in 1 year. She verbalizes understanding and agreement with this plan.    No orders of the defined types were placed in this encounter.    Meds ordered this encounter  Medications  . propranolol ER (INDERAL LA) 120 MG 24 hr capsule    Sig: Take 1 capsule (120 mg total) by mouth daily.    Dispense:  90 capsule    Refill:  3    Order Specific Question:   Supervising Provider    Answer:   Anson Fret [id number]  . SUMAtriptan (IMITREX) 50 MG tablet    Sig: Take 1 tablet (50 mg total) by mouth every 2 (two) hours as needed for migraine. May repeat in 2 hours if headache persists or recurs.    Dispense:  10 tablet    Refill:  11    Order Specific Question:   Supervising Provider    Answer:   Anson Fret [id number]      I spent 20 minutes with the patient. 50% of this time was spent counseling and educating patient on plan of care and medications.    Shawnie Dapper, FNP-C 02/18/2020, 8:45 AM [facility] [address], [address] [place], [place] [address] [phone]  Made any corrections needed, and agree with history, physical, neuro exam,assessment and plan as stated.     Naomie Dean, MD [facility]

## 2020-02-18 NOTE — Patient Instructions (Signed)
Below is our plan:  We will continue propranolol and sumatriptan.   Please make sure you are staying well hydrated. I recommend 50-60 ounces daily. Well balanced diet and regular exercise encouraged.    Please continue follow up with care team as directed.   Follow up in 1 year   You may receive a survey regarding today's visit. I encourage you to leave honest feed back as I do use this information to improve patient care. Thank you for seeing me today!      Migraine Headache A migraine headache is a very strong throbbing pain on one side or both sides of your head. This type of headache can also cause other symptoms. It can last from 4 hours to 3 days. Talk with your doctor about what things may bring on (trigger) this condition. What are the causes? The exact cause of this condition is not known. This condition may be triggered or caused by:  Drinking alcohol.  Smoking.  Taking medicines, such as: ? Medicine used to treat chest pain (nitroglycerin). ? Birth control pills. ? Estrogen. ? Some blood pressure medicines.  Eating or drinking certain products.  Doing physical activity. Other things that may trigger a migraine headache include:  Having a menstrual period.  Pregnancy.  Hunger.  Stress.  Not getting enough sleep or getting too much sleep.  Weather changes.  Tiredness (fatigue). What increases the risk?  Being 21-44 years old.  Being female.  Having a family history of migraine headaches.  Being Caucasian.  Having depression or anxiety.  Being very overweight. What are the signs or symptoms?  A throbbing pain. This pain may: ? Happen in any area of the head, such as on one side or both sides. ? Make it hard to do daily activities. ? Get worse with physical activity. ? Get worse around bright lights or loud noises.  Other symptoms may include: ? Feeling sick to your stomach (nauseous). ? Vomiting. ? Dizziness. ? Being sensitive to  bright lights, loud noises, or smells.  Before you get a migraine headache, you may get warning signs (an aura). An aura may include: ? Seeing flashing lights or having blind spots. ? Seeing bright spots, halos, or zigzag lines. ? Having tunnel vision or blurred vision. ? Having numbness or a tingling feeling. ? Having trouble talking. ? Having weak muscles.  Some people have symptoms after a migraine headache (postdromal phase), such as: ? Tiredness. ? Trouble thinking (concentrating). How is this treated?  Taking medicines that: ? Relieve pain. ? Relieve the feeling of being sick to your stomach. ? Prevent migraine headaches.  Treatment may also include: ? Having acupuncture. ? Avoiding foods that bring on migraine headaches. ? Learning ways to control your body functions (biofeedback). ? Therapy to help you know and deal with negative thoughts (cognitive behavioral therapy). Follow these instructions at home: Medicines  Take over-the-counter and prescription medicines only as told by your doctor.  Ask your doctor if the medicine prescribed to you: ? Requires you to avoid driving or using heavy machinery. ? Can cause trouble pooping (constipation). You may need to take these steps to prevent or treat trouble pooping:  Drink enough fluid to keep your pee (urine) pale yellow.  Take over-the-counter or prescription medicines.  Eat foods that are high in fiber. These include beans, whole grains, and fresh fruits and vegetables.  Limit foods that are high in fat and sugar. These include fried or sweet foods. Lifestyle  Do not  drink alcohol.  Do not use any products that contain nicotine or tobacco, such as cigarettes, e-cigarettes, and chewing tobacco. If you need help quitting, ask your doctor.  Get at least 8 hours of sleep every night.  Limit and deal with stress. General instructions      Keep a journal to find out what may bring on your migraine headaches. For  example, write down: ? What you eat and drink. ? How much sleep you get. ? Any change in what you eat or drink. ? Any change in your medicines.  If you have a migraine headache: ? Avoid things that make your symptoms worse, such as bright lights. ? It may help to lie down in a dark, quiet room. ? Do not drive or use heavy machinery. ? Ask your doctor what activities are safe for you.  Keep all follow-up visits as told by your doctor. This is important. Contact a doctor if:  You get a migraine headache that is different or worse than others you have had.  You have more than 15 headache days in one month. Get help right away if:  Your migraine headache gets very bad.  Your migraine headache lasts longer than 72 hours.  You have a fever.  You have a stiff neck.  You have trouble seeing.  Your muscles feel weak or like you cannot control them.  You start to lose your balance a lot.  You start to have trouble walking.  You pass out (faint).  You have a seizure. Summary  A migraine headache is a very strong throbbing pain on one side or both sides of your head. These headaches can also cause other symptoms.  This condition may be treated with medicines and changes to your lifestyle.  Keep a journal to find out what may bring on your migraine headaches.  Contact a doctor if you get a migraine headache that is different or worse than others you have had.  Contact your doctor if you have more than 15 headache days in a month. This information is not intended to replace advice given to you by your health care provider. Make sure you discuss any questions you have with your health care provider. Document Revised: 05/26/2018 Document Reviewed: 03/16/2018 Elsevier Patient Education  2020 [employer].

## 2020-03-05 ENCOUNTER — Encounter: Payer: Self-pay | Admitting: Internal Medicine

## 2020-03-05 ENCOUNTER — Telehealth (INDEPENDENT_AMBULATORY_CARE_PROVIDER_SITE_OTHER): Payer: 59 | Admitting: Internal Medicine

## 2020-03-05 VITALS — Wt 155.0 lb

## 2020-03-05 DIAGNOSIS — L659 Nonscarring hair loss, unspecified: Secondary | ICD-10-CM

## 2020-03-05 NOTE — Progress Notes (Signed)
Virtual Visit via Video Note  I connected with Kathleen Powell on 03/05/20 at  4:00 PM EST by a video enabled telemedicine application and verified that I am speaking with the correct person using two identifiers.  Location patient: home Location provider: work office Persons participating in the virtual visit: patient, provider  I discussed the limitations of evaluation and management by telemedicine and the availability of in person appointments. The patient expressed understanding and agreed to proceed.   HPI: She has scheduled this visit to discuss hair loss.  She states she first noticed this about 3 to 4 months ago and it has gotten progressively worse.  She does not use any chemical relaxers.  She has been using the same hair products for years.  She has had no recent changes in medication.  She has no other complaints.  No major changes in weight, or eating habits.   ROS: Constitutional: Denies fever, chills, diaphoresis, appetite change and fatigue.  HEENT: Denies photophobia, eye pain, redness, hearing loss, ear pain, congestion, sore throat, rhinorrhea, sneezing, mouth sores, trouble swallowing, neck pain, neck stiffness and tinnitus.   Respiratory: Denies SOB, DOE, cough, chest tightness,  and wheezing.   Cardiovascular: Denies chest pain, palpitations and leg swelling.  Gastrointestinal: Denies nausea, vomiting, abdominal pain, diarrhea, constipation, blood in stool and abdominal distention.  Genitourinary: Denies dysuria, urgency, frequency, hematuria, flank pain and difficulty urinating.  Endocrine: Denies: hot or cold intolerance, sweats, changes in hair or nails, polyuria, polydipsia. Musculoskeletal: Denies myalgias, back pain, joint swelling, arthralgias and gait problem.  Skin: Denies pallor, rash and wound.  Neurological: Denies dizziness, seizures, syncope, weakness, light-headedness, numbness and headaches.  Hematological: Denies adenopathy. Easy bruising,  personal or family bleeding history  Psychiatric/Behavioral: Denies suicidal ideation, mood changes, confusion, nervousness, sleep disturbance and agitation   Past Medical History:  Diagnosis Date  . Migraine   . Migraine   . No pertinent past medical history   . Tobacco abuse   . Vertigo     Past Surgical History:  Procedure Laterality Date  . CESAREAN SECTION  11/27/2011   Procedure: CESAREAN SECTION;  Surgeon: Kathreen Cosier, MD;  Location: [facility];  Service: Obstetrics;  Laterality: N/A;  Primary cesarean section with delivery of baby boy at 67. Apgars 8/9.    Family History  Problem Relation Age of Onset  . Hypertension Mother   . Cancer Mother        thyroid  . Heart disease Father        Visual merchandiser  . Heart disease Maternal Grandmother        congestive heart failure  . Hypertension Maternal Grandmother   . Heart attack Maternal Grandmother   . Asthma Sister   . Gout Maternal Grandfather   . Colon cancer Paternal Grandfather   . Colon cancer Paternal Uncle   . Anesthesia problems Neg Hx     SOCIAL HX:   reports that she has been smoking cigarettes. She has been smoking about 0.25 packs per day. She has never used smokeless tobacco. She reports current alcohol use. She reports that she does not use drugs.   Current Outpatient Medications:  .  cyclobenzaprine (FLEXERIL) 10 MG tablet, Take 1 tablet (10 mg total) by mouth 2 (two) times daily as needed for muscle spasms., Disp: 20 tablet, Rfl: 0 .  ibuprofen (ADVIL,MOTRIN) 800 MG tablet, Take 1 tablet (800 mg total) by mouth every 8 (eight) hours as needed., Disp: 30 tablet, Rfl: 5 .  propranolol ER (INDERAL LA) 120 MG 24 hr capsule, Take 1 capsule (120 mg total) by mouth daily., Disp: 90 capsule, Rfl: 3 .  SUMAtriptan (IMITREX) 50 MG tablet, Take 1 tablet (50 mg total) by mouth every 2 (two) hours as needed for migraine. May repeat in 2 hours if headache persists or recurs., Disp: 10 tablet, Rfl: 11  EXAM:    VITALS per patient if applicable: None reported  GENERAL: alert, oriented, appears well and in no acute distress  HEENT: atraumatic, conjunttiva clear, no obvious abnormalities on inspection of external nose and ears  NECK: normal movements of the head and neck  LUNGS: on inspection no signs of respiratory distress, breathing rate appears normal, no obvious gross increased work of breathing, gasping or wheezing  CV: no obvious cyanosis  MS: moves all visible extremities without noticeable abnormality  PSYCH/NEURO: pleasant and cooperative, no obvious depression or anxiety, speech and thought processing grossly intact  ASSESSMENT AND PLAN:   Hair loss  - Plan: CBC with Differential/Platelet, TSH, Vitamin B12, VITAMIN D 25 Hydroxy (Vit-D Deficiency, Fractures) -I have instructed her to take a good quality multivitamin daily as well as a biotin supplement twice daily. -If above labs are normal and hair loss continues, can consider dermatology referral.     I discussed the assessment and treatment plan with the patient. The patient was provided an opportunity to ask questions and all were answered. The patient agreed with the plan and demonstrated an understanding of the instructions.   The patient was advised to call back or seek an in-person evaluation if the symptoms worsen or if the condition fails to improve as anticipated.    Chaya Jan, MD  [facility] at [facility]

## 2020-04-01 ENCOUNTER — Telehealth: Payer: Self-pay

## 2020-04-01 NOTE — Telephone Encounter (Signed)
Return call to pt on [phone] Regarding concern Of irregular bleeding since stopping DEPO Pt last seen by provider 10/2018 Pt advised to make appt for Annual and  discuss management or irregular bleeding.

## 2020-04-18 ENCOUNTER — Other Ambulatory Visit: Payer: Self-pay

## 2020-04-18 ENCOUNTER — Encounter: Payer: Self-pay | Admitting: Obstetrics

## 2020-04-18 ENCOUNTER — Ambulatory Visit (INDEPENDENT_AMBULATORY_CARE_PROVIDER_SITE_OTHER): Payer: 59 | Admitting: Obstetrics

## 2020-04-18 ENCOUNTER — Other Ambulatory Visit (HOSPITAL_COMMUNITY)
Admission: RE | Admit: 2020-04-18 | Discharge: 2020-04-18 | Disposition: A | Discharge status: Home / Self Care | Payer: 59 | Source: Ambulatory Visit | Attending: Obstetrics | Admitting: Obstetrics

## 2020-04-18 VITALS — BP 119/76 | HR 86 | Ht 63.0 in | Wt 162.0 lb

## 2020-04-18 DIAGNOSIS — Z3009 Encounter for other general counseling and advice on contraception: Secondary | ICD-10-CM

## 2020-04-18 DIAGNOSIS — Z01419 Encounter for gynecological examination (general) (routine) without abnormal findings: Secondary | ICD-10-CM

## 2020-04-18 DIAGNOSIS — Z124 Encounter for screening for malignant neoplasm of cervix: Secondary | ICD-10-CM | POA: Diagnosis present

## 2020-04-18 DIAGNOSIS — Z30011 Encounter for initial prescription of contraceptive pills: Secondary | ICD-10-CM

## 2020-04-18 MED ORDER — AUROVELA FE 1/20 1-20 MG-MCG PO TABS: 1.0000 | ORAL_TABLET | Freq: Every day | ORAL | 11 refills | Status: DC

## 2020-04-18 NOTE — Progress Notes (Signed)
Subjective:        Kathleen Powell is a 35 y.o. female here for a routine exam.  Current complaints: None.    Personal health questionnaire:  Is patient Kathleen Powell, have a family history of breast and/or ovarian cancer: no Is there a family history of uterine cancer diagnosed at age < 27, gastrointestinal cancer, urinary tract cancer, family member who is a Personnel officer syndrome-associated carrier: yes Is the patient overweight and hypertensive, family history of diabetes, personal history of gestational diabetes, preeclampsia or PCOS: no Is patient over 3, have PCOS,  family history of premature CHD under age 39, diabetes, smoke, have hypertension or peripheral artery disease:  no At any time, has a partner hit, kicked or otherwise hurt or frightened you?: no Over the past 2 weeks, have you felt down, depressed or hopeless?: no Over the past 2 weeks, have you felt little interest or pleasure in doing things?:no   Gynecologic History Patient's last menstrual period was 03/31/2020 (exact date). Contraception: none Last Pap: 2019. Results were: normal Last mammogram: n/a. Results were: n/a  Obstetric History OB History  Gravida Para Term Preterm AB Living  1 1 1     1   SAB IAB Ectopic Multiple Live Births          1    # Outcome Date GA Lbr Len/2nd Weight Sex Delivery Anes PTL Lv  1 Term 11/27/11 [redacted]w[redacted]d  6 lb 3.3 oz (2.815 kg) M CS-LTranv EPI  LIV    Past Medical History:  Diagnosis Date  . Migraine   . Migraine   . No pertinent past medical history   . Tobacco abuse   . Vertigo     Past Surgical History:  Procedure Laterality Date  . CESAREAN SECTION  11/27/2011   Procedure: CESAREAN SECTION;  Surgeon: 01/27/2012, MD;  Location: [facility];  Service: Obstetrics;  Laterality: N/A;  Primary cesarean section with delivery of baby boy at 15. Apgars 8/9.     Current Outpatient Medications:  .  norethindrone-ethinyl estradiol (AUROVELA FE 1/20) 1-20 MG-MCG tablet,  Take 1 tablet by mouth daily., Disp: 28 tablet, Rfl: 11 .  propranolol ER (INDERAL LA) 120 MG 24 hr capsule, Take 1 capsule (120 mg total) by mouth daily., Disp: 90 capsule, Rfl: 3 .  cyclobenzaprine (FLEXERIL) 10 MG tablet, Take 1 tablet (10 mg total) by mouth 2 (two) times daily as needed for muscle spasms. (Patient not taking: Reported on 04/18/2020), Disp: 20 tablet, Rfl: 0 .  ibuprofen (ADVIL,MOTRIN) 800 MG tablet, Take 1 tablet (800 mg total) by mouth every 8 (eight) hours as needed. (Patient not taking: Reported on 04/18/2020), Disp: 30 tablet, Rfl: 5 .  SUMAtriptan (IMITREX) 50 MG tablet, Take 1 tablet (50 mg total) by mouth every 2 (two) hours as needed for migraine. May repeat in 2 hours if headache persists or recurs. (Patient not taking: Reported on 04/18/2020), Disp: 10 tablet, Rfl: 11 No Known Allergies  Social History   Tobacco Use  . Smoking status: Former Smoker    Packs/day: 0.25    Types: Cigarettes    Quit date: 09/09/2011    Years since quitting: 8.6  . Smokeless tobacco: Never Used  . Tobacco comment: Recently quit has not smoked in 3 months   Substance Use Topics  . Alcohol use: Not Currently    Alcohol/week: 0.0 standard drinks    Family History  Problem Relation Age of Onset  . Hypertension Mother   . Cancer  Mother        thyroid  . Heart disease Father        Visual merchandiser  . Heart disease Maternal Grandmother        congestive heart failure  . Hypertension Maternal Grandmother   . Heart attack Maternal Grandmother   . Asthma Sister   . Gout Maternal Grandfather   . Colon cancer Paternal Grandfather   . Colon cancer Paternal Uncle   . Anesthesia problems Neg Hx       Review of Systems  Constitutional: negative for fatigue and weight loss Respiratory: negative for cough and wheezing Cardiovascular: negative for chest pain, fatigue and palpitations Gastrointestinal: negative for abdominal pain and change in bowel habits Musculoskeletal:negative for  myalgias Neurological: negative for gait problems and tremors Behavioral/Psych: negative for abusive relationship, depression Endocrine: negative for temperature intolerance    Genitourinary:negative for abnormal menstrual periods, genital lesions, hot flashes, sexual problems and vaginal discharge Integument/breast: negative for breast lump, breast tenderness, nipple discharge and skin lesion(s)    Objective:       BP 119/76   Pulse 86   Ht 5\' 3"  (1.6 m)   Wt 162 lb (73.5 kg)   LMP 03/31/2020 (Exact Date)   BMI 28.70 kg/m  General:   alert and no distress  Skin:   no rash or abnormalities  Lungs:   clear to auscultation bilaterally  Heart:   regular rate and rhythm, S1, S2 normal, no murmur, click, rub or gallop  Breasts:   normal without suspicious masses, skin or nipple changes or axillary nodes  Abdomen:  normal findings: no organomegaly, soft, non-tender and no hernia  Pelvis:  External genitalia: normal general appearance Urinary system: urethral meatus normal and bladder without fullness, nontender Vaginal: normal without tenderness, induration or masses Cervix: normal appearance Adnexa: normal bimanual exam Uterus: anteverted and non-tender, normal size   Lab Review Urine pregnancy test Labs reviewed yes Radiologic studies reviewed no  50% of 20 min visit spent on counseling and coordination of care.   Assessment:      1. Encounter for routine gynecological examination with Papanicolaou smear of cervix Rx: - Cytology - PAP( [facility])  2. Encounter for counseling regarding contraception - wants to start OCP's  3. Encounter for initial prescription of contraceptive pills Rx: - norethindrone-ethinyl estradiol (AUROVELA FE 1/20) 1-20 MG-MCG tablet; Take 1 tablet by mouth daily.  Dispense: 28 tablet; Refill: 11   Plan:    Education reviewed: calcium supplements, depression evaluation, low fat, low cholesterol diet, safe sex/STD prevention, self breast exams  and weight bearing exercise. Contraception: OCP (estrogen/progesterone). Follow up in: 1 year.   Meds ordered this encounter  Medications  . norethindrone-ethinyl estradiol (AUROVELA FE 1/20) 1-20 MG-MCG tablet    Sig: Take 1 tablet by mouth daily.    Dispense:  28 tablet    Refill:  11     2/20, MD 04/18/2020 10:15 AM

## 2020-04-18 NOTE — Progress Notes (Signed)
Patient present for Annual Exam.  LMP: 03/31/2020 lasted 3 days  Last pap:09/05/2017. Hx of abnormal paps.  STD Screening: Declines  Family Hx of Breast Cancer: No   CC: Irregular Periods. Stopped Depo in August.

## 2020-04-22 LAB — CYTOLOGY - PAP
Comment: NEGATIVE
Diagnosis: NEGATIVE
Diagnosis: REACTIVE
High risk HPV: NEGATIVE

## 2020-10-10 ENCOUNTER — Other Ambulatory Visit: Payer: Self-pay

## 2020-10-10 ENCOUNTER — Other Ambulatory Visit: Payer: Self-pay | Admitting: Internal Medicine

## 2020-10-10 ENCOUNTER — Encounter: Payer: Self-pay | Admitting: Internal Medicine

## 2020-10-10 ENCOUNTER — Ambulatory Visit: Payer: 59 | Admitting: Internal Medicine

## 2020-10-10 VITALS — BP 110/80 | HR 92 | Temp 98.4°F | Wt 160.1 lb

## 2020-10-10 DIAGNOSIS — E559 Vitamin D deficiency, unspecified: Secondary | ICD-10-CM

## 2020-10-10 DIAGNOSIS — E538 Deficiency of other specified B group vitamins: Secondary | ICD-10-CM | POA: Insufficient documentation

## 2020-10-10 DIAGNOSIS — M79601 Pain in right arm: Secondary | ICD-10-CM | POA: Diagnosis not present

## 2020-10-10 DIAGNOSIS — L659 Nonscarring hair loss, unspecified: Secondary | ICD-10-CM

## 2020-10-10 LAB — CBC WITH DIFFERENTIAL/PLATELET
Basophils Absolute: 0 10*3/uL (ref 0.0–0.1)
Basophils Relative: 0.3 % (ref 0.0–3.0)
Eosinophils Absolute: 0.1 10*3/uL (ref 0.0–0.7)
Eosinophils Relative: 2.6 % (ref 0.0–5.0)
HCT: 37.7 % (ref 36.0–46.0)
Hemoglobin: 12.5 g/dL (ref 12.0–15.0)
Lymphocytes Relative: 36.2 % (ref 12.0–46.0)
Lymphs Abs: 1.7 10*3/uL (ref 0.7–4.0)
MCHC: 33 g/dL (ref 30.0–36.0)
MCV: 95 fl (ref 78.0–100.0)
Monocytes Absolute: 0.7 10*3/uL (ref 0.1–1.0)
Monocytes Relative: 15.5 % — ABNORMAL HIGH (ref 3.0–12.0)
Neutro Abs: 2.1 10*3/uL (ref 1.4–7.7)
Neutrophils Relative %: 45.4 % (ref 43.0–77.0)
Platelets: 329 10*3/uL (ref 150.0–400.0)
RBC: 3.97 Mil/uL (ref 3.87–5.11)
RDW: 13.1 % (ref 11.5–15.5)
WBC: 4.6 10*3/uL (ref 4.0–10.5)

## 2020-10-10 LAB — VITAMIN D 25 HYDROXY (VIT D DEFICIENCY, FRACTURES): VITD: 14.53 ng/mL — ABNORMAL LOW (ref 30.00–100.00)

## 2020-10-10 LAB — VITAMIN B12: Vitamin B-12: 180 pg/mL — ABNORMAL LOW (ref 211–911)

## 2020-10-10 LAB — TSH: TSH: 0.97 u[IU]/mL (ref 0.35–5.50)

## 2020-10-10 MED ORDER — VITAMIN D (ERGOCALCIFEROL) 1.25 MG (50000 UNIT) PO CAPS: 50000.0000 [IU] | ORAL_CAPSULE | ORAL | 0 refills | Status: AC

## 2020-10-10 MED ORDER — CYANOCOBALAMIN 1000 MCG/ML IJ SOLN: INTRAMUSCULAR | 11 refills | Status: DC

## 2020-10-10 MED ORDER — "BD SAFETYGLIDE SYRINGE/NEEDLE 25G X 1"" 3 ML MISC": 11 refills | Status: DC

## 2020-10-10 NOTE — Progress Notes (Signed)
Acute office Visit     This visit occurred during the SARS-CoV-2 public health emergency.  Safety protocols were in place, including screening questions prior to the visit, additional usage of staff PPE, and extensive cleaning of exam room while observing appropriate contact time as indicated for disinfecting solutions.    CC/Reason for Visit: Right arm pain  HPI: Kathleen Powell is a 35 y.o. female who is coming in today for the above mentioned reasons. Past Medical History is significant for: Tobacco use only.  We had had a video consultation a few months ago for hair loss but she did not come in for labs.  She is overdue for physical.  She will schedule today.  She is here today to discuss right arm pain.  It has been present for about 2 months.  Pain is located around the antecubital fossa but more towards the lateral side of the elbow where the flexor and extensor muscles insert.  She works on a Animator all day long.  The elbow itself is not painful.  It is not painful proximally or distally.   Past Medical/Surgical History: Past Medical History:  Diagnosis Date   Migraine    Migraine    No pertinent past medical history    Tobacco abuse    Vertigo     Past Surgical History:  Procedure Laterality Date   CESAREAN SECTION  11/27/2011   Procedure: CESAREAN SECTION;  Surgeon: Kathreen Cosier, MD;  Location: [facility];  Service: Obstetrics;  Laterality: N/A;  Primary cesarean section with delivery of baby boy at 38. Apgars 8/9.    Social History:  reports that she quit smoking about 9 years ago. Her smoking use included cigarettes. She smoked an average of .25 packs per day. She has never used smokeless tobacco. She reports that she does not currently use alcohol. She reports that she does not use drugs.  Allergies: No Known Allergies  Family History:  Family History  Problem Relation Age of Onset   Hypertension Mother    Cancer Mother        thyroid   Heart  disease Father        pace maker   Heart disease Maternal Grandmother        congestive heart failure   Hypertension Maternal Grandmother    Heart attack Maternal Grandmother    Asthma Sister    Gout Maternal Grandfather    Colon cancer Paternal Grandfather    Colon cancer Paternal Uncle    Anesthesia problems Neg Hx      Current Outpatient Medications:    norethindrone-ethinyl estradiol (AUROVELA FE 1/20) 1-20 MG-MCG tablet, Take 1 tablet by mouth daily., Disp: 28 tablet, Rfl: 11   propranolol ER (INDERAL LA) 120 MG 24 hr capsule, Take 1 capsule (120 mg total) by mouth daily., Disp: 90 capsule, Rfl: 3   SUMAtriptan (IMITREX) 50 MG tablet, Take 1 tablet (50 mg total) by mouth every 2 (two) hours as needed for migraine. May repeat in 2 hours if headache persists or recurs., Disp: 10 tablet, Rfl: 11  Review of Systems:  Constitutional: Denies fever, chills, diaphoresis, appetite change and fatigue.  HEENT: Denies photophobia, eye pain, redness, hearing loss, ear pain, congestion, sore throat, rhinorrhea, sneezing, mouth sores, trouble swallowing, neck pain, neck stiffness and tinnitus.   Respiratory: Denies SOB, DOE, cough, chest tightness,  and wheezing.   Cardiovascular: Denies chest pain, palpitations and leg swelling.  Gastrointestinal: Denies nausea, vomiting, abdominal pain, diarrhea,  constipation, blood in stool and abdominal distention.  Genitourinary: Denies dysuria, urgency, frequency, hematuria, flank pain and difficulty urinating.  Endocrine: Denies: hot or cold intolerance, sweats, changes in hair or nails, polyuria, polydipsia. Musculoskeletal: Denies myalgias, back pain, joint swelling, arthralgias and gait problem.  Skin: Denies pallor, rash and wound.  Neurological: Denies dizziness, seizures, syncope, weakness, light-headedness, numbness and headaches.  Hematological: Denies adenopathy. Easy bruising, personal or family bleeding history  Psychiatric/Behavioral: Denies  suicidal ideation, mood changes, confusion, nervousness, sleep disturbance and agitation    Physical Exam: Vitals:   10/10/20 0706  BP: 110/80  Pulse: 92  Temp: 98.4 F (36.9 C)  TempSrc: Oral  SpO2: 97%  Weight: 160 lb 1.6 oz (72.6 kg)    Body mass index is 28.36 kg/m.   Constitutional: NAD, calm, comfortable Eyes: PERRL, lids and conjunctivae normal ENMT: Mucous membranes are moist.  Respiratory: clear to auscultation bilaterally, no wheezing, no crackles. Normal respiratory effort. No accessory muscle use.  Cardiovascular: Regular rate and rhythm, no murmurs / rubs / gallops. No extremity edema Musculoskeletal: There is slight pain to palpation of the lateral elbow below the lateral epicondyles Neurologic: Grossly intact and nonfocal Psychiatric: Normal judgment and insight. Alert and oriented x 3. Normal mood.    Impression and Plan:  Right arm pain -Suspect this is flexor and extensor tendinitis, have advised icing, bracing.  If fails to improve can consider PT or sports medicine referral.    Chaya Jan, MD [facility] at [facility]

## 2020-10-10 NOTE — Addendum Note (Signed)
Addended by: Kandra Nicolas on: 10/10/2020 08:03 AM   Modules accepted: Orders

## 2020-10-29 ENCOUNTER — Other Ambulatory Visit: Payer: Self-pay

## 2020-10-30 ENCOUNTER — Other Ambulatory Visit: Payer: Self-pay | Admitting: *Deleted

## 2020-10-30 ENCOUNTER — Ambulatory Visit (INDEPENDENT_AMBULATORY_CARE_PROVIDER_SITE_OTHER): Payer: 59 | Admitting: Internal Medicine

## 2020-10-30 ENCOUNTER — Encounter: Payer: Self-pay | Admitting: Internal Medicine

## 2020-10-30 VITALS — BP 124/84 | HR 93 | Temp 98.1°F | Ht 63.5 in | Wt 159.9 lb

## 2020-10-30 DIAGNOSIS — E538 Deficiency of other specified B group vitamins: Secondary | ICD-10-CM | POA: Diagnosis not present

## 2020-10-30 DIAGNOSIS — Z0000 Encounter for general adult medical examination without abnormal findings: Secondary | ICD-10-CM

## 2020-10-30 DIAGNOSIS — E559 Vitamin D deficiency, unspecified: Secondary | ICD-10-CM

## 2020-10-30 DIAGNOSIS — Z23 Encounter for immunization: Secondary | ICD-10-CM

## 2020-10-30 DIAGNOSIS — F17210 Nicotine dependence, cigarettes, uncomplicated: Secondary | ICD-10-CM | POA: Diagnosis not present

## 2020-10-30 LAB — COMPREHENSIVE METABOLIC PANEL
ALT: 15 U/L (ref 0–35)
AST: 16 U/L (ref 0–37)
Albumin: 3.7 g/dL (ref 3.5–5.2)
Alkaline Phosphatase: 40 U/L (ref 39–117)
BUN: 12 mg/dL (ref 6–23)
CO2: 26 mEq/L (ref 19–32)
Calcium: 8.5 mg/dL (ref 8.4–10.5)
Chloride: 106 mEq/L (ref 96–112)
Creatinine, Ser: 0.87 mg/dL (ref 0.40–1.20)
GFR: 85 mL/min (ref >60.00)
Glucose, Bld: 78 mg/dL (ref 70–99)
Potassium: 3.9 mEq/L (ref 3.5–5.1)
Sodium: 138 mEq/L (ref 135–145)
Total Bilirubin: 0.7 mg/dL (ref 0.2–1.2)
Total Protein: 6.6 g/dL (ref 6.0–8.3)

## 2020-10-30 LAB — CBC WITH DIFFERENTIAL/PLATELET
Basophils Absolute: 0 10*3/uL (ref 0.0–0.1)
Basophils Relative: 0.2 % (ref 0.0–3.0)
Eosinophils Absolute: 0.1 10*3/uL (ref 0.0–0.7)
Eosinophils Relative: 1.4 % (ref 0.0–5.0)
HCT: 39.6 % (ref 36.0–46.0)
Hemoglobin: 13.2 g/dL (ref 12.0–15.0)
Lymphocytes Relative: 33.4 % (ref 12.0–46.0)
Lymphs Abs: 1.9 10*3/uL (ref 0.7–4.0)
MCHC: 33.4 g/dL (ref 30.0–36.0)
MCV: 94.4 fl (ref 78.0–100.0)
Monocytes Absolute: 0.8 10*3/uL (ref 0.1–1.0)
Monocytes Relative: 13.9 % — ABNORMAL HIGH (ref 3.0–12.0)
Neutro Abs: 2.9 10*3/uL (ref 1.4–7.7)
Neutrophils Relative %: 51.1 % (ref 43.0–77.0)
Platelets: 353 10*3/uL (ref 150.0–400.0)
RBC: 4.19 Mil/uL (ref 3.87–5.11)
RDW: 13 % (ref 11.5–15.5)
WBC: 5.6 10*3/uL (ref 4.0–10.5)

## 2020-10-30 LAB — LIPID PANEL
Cholesterol: 125 mg/dL (ref 0–200)
HDL: 46.9 mg/dL (ref >39.00)
LDL Cholesterol: 66 mg/dL (ref 0–99)
NonHDL: 78.46
Total CHOL/HDL Ratio: 3
Triglycerides: 61 mg/dL (ref 0.0–149.0)
VLDL: 12.2 mg/dL (ref 0.0–40.0)

## 2020-10-30 LAB — HEMOGLOBIN A1C: Hgb A1c MFr Bld: 5.7 % (ref 4.6–6.5)

## 2020-10-30 LAB — VITAMIN B12: Vitamin B-12: 413 pg/mL (ref 211–911)

## 2020-10-30 LAB — VITAMIN D 25 HYDROXY (VIT D DEFICIENCY, FRACTURES): VITD: 26.46 ng/mL — ABNORMAL LOW (ref 30.00–100.00)

## 2020-10-30 LAB — TSH: TSH: 1.55 u[IU]/mL (ref 0.35–5.50)

## 2020-10-30 NOTE — Patient Instructions (Signed)
-  Nice seeing you today!! ? ?-Lab work today; will notify you once results are available. ? ?-Flu vaccine today. ? ?-Remember your COVID booster. ? ?-Schedule follow up in 1 year or sooner as needed. ?

## 2020-10-30 NOTE — Addendum Note (Signed)
Addended by: Kern Reap B on: 10/30/2020 11:48 AM   Modules accepted: Orders

## 2020-10-30 NOTE — Progress Notes (Signed)
Established Patient Office Visit     This visit occurred during the SARS-CoV-2 public health emergency.  Safety protocols were in place, including screening questions prior to the visit, additional usage of staff PPE, and extensive cleaning of exam room while observing appropriate contact time as indicated for disinfecting solutions.    CC/Reason for Visit: Annual preventive exam  HPI: Kathleen Powell is a 35 y.o. female who is coming in today for the above mentioned reasons. Past Medical History is significant for: Nicotine dependence only.  She has routine eye and dental care.  She has started an exercise program where she is walking for at least 30 minutes a day.  She had a Pap smear earlier this year with her gynecologist, Dr. Clearance Coots in March.  She is requesting flu vaccine today, she is also due for COVID booster, her Tdap will be due for renewal in October 2023.  She is doing well and has no acute concerns today.   Past Medical/Surgical History: Past Medical History:  Diagnosis Date   Migraine    Migraine    No pertinent past medical history    Tobacco abuse    Vertigo     Past Surgical History:  Procedure Laterality Date   CESAREAN SECTION  11/27/2011   Procedure: CESAREAN SECTION;  Surgeon: Kathreen Cosier, MD;  Location: [facility];  Service: Obstetrics;  Laterality: N/A;  Primary cesarean section with delivery of baby boy at 87. Apgars 8/9.    Social History:  reports that she quit smoking about 9 years ago. Her smoking use included cigarettes. She smoked an average of .25 packs per day. She has never used smokeless tobacco. She reports that she does not currently use alcohol. She reports that she does not use drugs.  Allergies: No Known Allergies  Family History:  Family History  Problem Relation Age of Onset   Hypertension Mother    Cancer Mother        thyroid   Cataracts Mother    Heart disease Father        pace maker   Kidney failure Father     Asthma Sister    Heart disease Maternal Grandmother        congestive heart failure   Hypertension Maternal Grandmother    Heart attack Maternal Grandmother    Gout Maternal Grandfather    Colon cancer Paternal Grandfather    Colon cancer Paternal Uncle    Anesthesia problems Neg Hx      Current Outpatient Medications:    cyanocobalamin (,VITAMIN B-12,) 1000 MCG/ML injection, Inject 59ml in deltoid once weekly for 4 weeks, then inject 1 ml once a month thereafter, Disp: 6 mL, Rfl: 11   norethindrone-ethinyl estradiol (AUROVELA FE 1/20) 1-20 MG-MCG tablet, Take 1 tablet by mouth daily., Disp: 28 tablet, Rfl: 11   propranolol ER (INDERAL LA) 120 MG 24 hr capsule, Take 1 capsule (120 mg total) by mouth daily., Disp: 90 capsule, Rfl: 3   SUMAtriptan (IMITREX) 50 MG tablet, Take 1 tablet (50 mg total) by mouth every 2 (two) hours as needed for migraine. May repeat in 2 hours if headache persists or recurs., Disp: 10 tablet, Rfl: 11   SYRINGE-NEEDLE, DISP, 3 ML (BD SAFETYGLIDE SYRINGE/NEEDLE) 25G X 1" 3 ML MISC, Use to inject B12, Disp: 100 each, Rfl: 11   Vitamin D, Ergocalciferol, (DRISDOL) 1.25 MG (50000 UNIT) CAPS capsule, Take 1 capsule (50,000 Units total) by mouth every 7 (seven) days for 12 doses.,  Disp: 12 capsule, Rfl: 0  Review of Systems:  Constitutional: Denies fever, chills, diaphoresis, appetite change and fatigue.  HEENT: Denies photophobia, eye pain, redness, hearing loss, ear pain, congestion, sore throat, rhinorrhea, sneezing, mouth sores, trouble swallowing, neck pain, neck stiffness and tinnitus.   Respiratory: Denies SOB, DOE, cough, chest tightness,  and wheezing.   Cardiovascular: Denies chest pain, palpitations and leg swelling.  Gastrointestinal: Denies nausea, vomiting, abdominal pain, diarrhea, constipation, blood in stool and abdominal distention.  Genitourinary: Denies dysuria, urgency, frequency, hematuria, flank pain and difficulty urinating.  Endocrine: Denies:  hot or cold intolerance, sweats, changes in hair or nails, polyuria, polydipsia. Musculoskeletal: Denies myalgias, back pain, joint swelling, arthralgias and gait problem.  Skin: Denies pallor, rash and wound.  Neurological: Denies dizziness, seizures, syncope, weakness, light-headedness, numbness and headaches.  Hematological: Denies adenopathy. Easy bruising, personal or family bleeding history  Psychiatric/Behavioral: Denies suicidal ideation, mood changes, confusion, nervousness, sleep disturbance and agitation    Physical Exam: Vitals:   10/30/20 0659  BP: 124/84  Pulse: 93  Temp: 98.1 F (36.7 C)  TempSrc: Oral  SpO2: 99%  Weight: 159 lb 14.4 oz (72.5 kg)  Height: 5' 3.5" (1.613 m)    Body mass index is 27.88 kg/m.   Constitutional: NAD, calm, comfortable Eyes: PERRL, lids and conjunctivae normal ENMT: Mucous membranes are moist. Posterior pharynx clear of any exudate or lesions. Normal dentition. Tympanic membrane is pearly white, no erythema or bulging. Neck: normal, supple, no masses, no thyromegaly Respiratory: clear to auscultation bilaterally, no wheezing, no crackles. Normal respiratory effort. No accessory muscle use.  Cardiovascular: Regular rate and rhythm, no murmurs / rubs / gallops. No extremity edema. 2+ pedal pulses. No carotid bruits.  Abdomen: no tenderness, no masses palpated. No hepatosplenomegaly. Bowel sounds positive.  Musculoskeletal: no clubbing / cyanosis. No joint deformity upper and lower extremities. Good ROM, no contractures. Normal muscle tone.  Skin: no rashes, lesions, ulcers. No induration Neurologic: CN 2-12 grossly intact. Sensation intact, DTR normal. Strength 5/5 in all 4.  Psychiatric: Normal judgment and insight. Alert and oriented x 3. Normal mood.    Impression and Plan:  Encounter for preventive health examination  - Plan: CBC with Differential/Platelet, Comprehensive metabolic panel, Lipid panel, Hemoglobin A1c, TSH -She has  routine eye and dental care. -Flu vaccine today, urged to get COVID booster at pharmacy but otherwise immunizations are up-to-date. -Screening labs today. -Healthy lifestyle discussed in detail. -Commence routine breast cancer screening age 76 and colon cancer screening age 49. -She had a Pap smear in March with her GYN.  Vitamin D deficiency  - Plan: VITAMIN D 25 Hydroxy (Vit-D Deficiency, Fractures)  Vitamin B12 deficiency  - Plan: Vitamin B12  Cigarette nicotine dependence without complication -I have discussed tobacco cessation with the patient.  I have counseled the patient regarding the negative impacts of continued tobacco use including but not limited to lung cancer, COPD, and cardiovascular disease.  I have discussed alternatives to tobacco and modalities that may help facilitate tobacco cessation including but not limited to biofeedback, hypnosis, and medications.  Total time spent with tobacco counseling was 4 minutes. -She remains in the precontemplative stage.  Need for influenza vaccination -Flu vaccine administered today.    Patient Instructions  -Nice seeing you today!!  -Lab work today; will notify you once results are available.  -Flu vaccine today.  -Remember your COVID booster.  -Schedule follow up in 1 year or sooner as needed.      Peggye Pitt  Deniece Ree, MD [facility]

## 2021-02-18 ENCOUNTER — Ambulatory Visit: Payer: 59 | Admitting: Family Medicine

## 2021-04-08 ENCOUNTER — Other Ambulatory Visit: Payer: Self-pay | Admitting: *Deleted

## 2021-04-08 DIAGNOSIS — Z30011 Encounter for initial prescription of contraceptive pills: Secondary | ICD-10-CM

## 2021-04-08 MED ORDER — NORETHIN ACE-ETH ESTRAD-FE 1-20 MG-MCG PO TABS: 1.0000 | ORAL_TABLET | Freq: Every day | ORAL | 1 refills | Status: DC

## 2021-04-08 NOTE — Progress Notes (Signed)
TC from patient requesting OCP refill. Advised patient to schedule annual, due 04/18/21. RX sent to cover until annual appt. Call transferred to front office for scheduling.

## 2021-05-15 ENCOUNTER — Other Ambulatory Visit (HOSPITAL_COMMUNITY)
Admission: RE | Admit: 2021-05-15 | Discharge: 2021-05-15 | Disposition: A | Discharge status: Home / Self Care | Payer: 59 | Source: Ambulatory Visit | Attending: Obstetrics | Admitting: Obstetrics

## 2021-05-15 ENCOUNTER — Ambulatory Visit (INDEPENDENT_AMBULATORY_CARE_PROVIDER_SITE_OTHER): Payer: 59 | Admitting: Obstetrics

## 2021-05-15 ENCOUNTER — Encounter: Payer: Self-pay | Admitting: Obstetrics

## 2021-05-15 VITALS — BP 115/78 | HR 94 | Ht 63.0 in | Wt 159.1 lb

## 2021-05-15 DIAGNOSIS — Z01419 Encounter for gynecological examination (general) (routine) without abnormal findings: Secondary | ICD-10-CM | POA: Insufficient documentation

## 2021-05-15 DIAGNOSIS — N898 Other specified noninflammatory disorders of vagina: Secondary | ICD-10-CM | POA: Diagnosis not present

## 2021-05-15 DIAGNOSIS — F17200 Nicotine dependence, unspecified, uncomplicated: Secondary | ICD-10-CM | POA: Diagnosis not present

## 2021-05-15 DIAGNOSIS — Z3009 Encounter for other general counseling and advice on contraception: Secondary | ICD-10-CM

## 2021-05-15 NOTE — Progress Notes (Signed)
? ?Subjective: ? ? ?  ?  ? Kathleen Powell is a 36 y.o. female here for a routine exam.  Current complaints: Vaginal discharge.   ? ?Personal health questionnaire:  ?Is patient [name], have a family history of breast and/or ovarian cancer: no ?Is there a family history of uterine cancer diagnosed at age < 17, gastrointestinal cancer, urinary tract cancer, family member who is a Personnel officer syndrome-associated carrier: yes ?Is the patient overweight and hypertensive, family history of diabetes, personal history of gestational diabetes, preeclampsia or PCOS: no ?Is patient over 77, have PCOS,  family history of premature CHD under age 67, diabetes, smoke, have hypertension or peripheral artery disease:  no ?At any time, has a partner hit, kicked or otherwise hurt or frightened you?: no ?Over the past 2 weeks, have you felt down, depressed or hopeless?: no ?Over the past 2 weeks, have you felt little interest or pleasure in doing things?:no ? ? ?Gynecologic History ?Patient's last menstrual period was 05/06/2021 (exact date). ?Contraception: none ?Last Pap: 2022. Results were: normal ?Last mammogram: n/a. Results were: n/a ? ?Obstetric History ?OB History  ?Gravida Para Term Preterm AB Living  ?1 1 1     1   ?SAB IAB Ectopic Multiple Live Births  ?        1  ?  ?# Outcome Date GA Lbr Len/2nd Weight Sex Delivery Anes PTL Lv  ?1 Term 11/27/11 [redacted]w[redacted]d  6 lb 3.3 oz (2.815 kg) M CS-LTranv EPI  LIV  ? ? ?Past Medical History:  ?Diagnosis Date  ? Migraine   ? Migraine   ? No pertinent past medical history   ? Tobacco abuse   ? Vertigo   ?  ?Past Surgical History:  ?Procedure Laterality Date  ? CESAREAN SECTION  11/27/2011  ? Procedure: CESAREAN SECTION;  Surgeon: 01/27/2012, MD;  Location: [facility];  Service: Obstetrics;  Laterality: N/A;  Primary cesarean section with delivery of baby boy at 61. Apgars 8/9.  ?  ? ?Current Outpatient Medications:  ?  cyanocobalamin (,VITAMIN B-12,) 1000 MCG/ML injection, Inject 29ml  in deltoid once weekly for 4 weeks, then inject 1 ml once a month thereafter, Disp: 6 mL, Rfl: 11 ?  norethindrone-ethinyl estradiol-FE (AUROVELA FE 1/20) 1-20 MG-MCG tablet, Take 1 tablet by mouth daily., Disp: 28 tablet, Rfl: 1 ?  propranolol ER (INDERAL LA) 120 MG 24 hr capsule, Take 1 capsule (120 mg total) by mouth daily. (Patient not taking: Reported on 05/15/2021), Disp: 90 capsule, Rfl: 3 ?  SUMAtriptan (IMITREX) 50 MG tablet, Take 1 tablet (50 mg total) by mouth every 2 (two) hours as needed for migraine. May repeat in 2 hours if headache persists or recurs. (Patient not taking: Reported on 05/15/2021), Disp: 10 tablet, Rfl: 11 ?  SYRINGE-NEEDLE, DISP, 3 ML (BD SAFETYGLIDE SYRINGE/NEEDLE) 25G X 1" 3 ML MISC, Use to inject B12 (Patient not taking: Reported on 05/15/2021), Disp: 100 each, Rfl: 11 ?No Known Allergies  ?Social History  ? ?Tobacco Use  ? Smoking status: Former  ?  Packs/day: 0.25  ?  Types: Cigarettes  ?  Quit date: 09/09/2011  ?  Years since quitting: 9.6  ? Smokeless tobacco: Current  ? Tobacco comments:  ?  Recently quit has not smoked in 3 months   ?Substance Use Topics  ? Alcohol use: Yes  ?  Comment: "once a weekend"  ?  ?Family History  ?Problem Relation Age of Onset  ? Hypertension Mother   ? Cancer  Mother   ?     thyroid  ? Cataracts Mother   ? Heart disease Father   ?     pace maker  ? Kidney failure Father   ? Asthma Sister   ? Heart disease Maternal Grandmother   ?     congestive heart failure  ? Hypertension Maternal Grandmother   ? Heart attack Maternal Grandmother   ? Gout Maternal Grandfather   ? Colon cancer Paternal Grandfather   ? Colon cancer Paternal Uncle   ? Anesthesia problems Neg Hx   ?  ? ? ?Review of Systems ? ?Constitutional: negative for fatigue and weight loss ?Respiratory: negative for cough and wheezing ?Cardiovascular: negative for chest pain, fatigue and palpitations ?Gastrointestinal: negative for abdominal pain and change in bowel habits ?Musculoskeletal:negative  for myalgias ?Neurological: negative for gait problems and tremors ?Behavioral/Psych: negative for abusive relationship, depression ?Endocrine: negative for temperature intolerance    ?Genitourinary:negative for abnormal menstrual periods, genital lesions, hot flashes, sexual problems and vaginal discharge ?Integument/breast: negative for breast lump, breast tenderness, nipple discharge and skin lesion(s) ? ?  ?Objective:  ? ?    ?BP 115/78   Pulse 94   Ht 5\' 3"  (1.6 m)   Wt 159 lb 1.6 oz (72.2 kg)   LMP 05/06/2021 (Exact Date)   BMI 28.18 kg/m?  ?General:   alert  ?Skin:   no rash or abnormalities  ?Lungs:   clear to auscultation bilaterally  ?Heart:   regular rate and rhythm, S1, S2 normal, no murmur, click, rub or gallop  ?Breasts:   normal without suspicious masses, skin or nipple changes or axillary nodes  ?Abdomen:  normal findings: no organomegaly, soft, non-tender and no hernia  ?Pelvis:  External genitalia: normal general appearance ?Urinary system: urethral meatus normal and bladder without fullness, nontender ?Vaginal: normal without tenderness, induration or masses ?Cervix: normal appearance ?Adnexa: normal bimanual exam ?Uterus: anteverted and non-tender, normal size  ? ?Lab Review ?Urine pregnancy test ?Labs reviewed yes ?Radiologic studies reviewed no ? ?I have spent a total of 20 minutes of face-to-face time, excluding clinical staff time, reviewing notes and preparing to see patient, ordering tests and/or medications, and counseling the patient.  ? ?Assessment:  ? ? 1. Encounter for routine gynecological examination with Papanicolaou smear of cervix ?Rx: ?- Cytology - PAP( [place]) ? ?2. Vaginal discharge ?Rx: ?- Cervicovaginal ancillary only( [place]) ? ?3. Tobacco dependence ?- cessation recommended with the aid of medication and behavioral modification ? ?4. Encounter for counseling regarding contraception ?- declines ?- condoms recommended for STI prevention ? ?  ?Plan:  ? ?  Education reviewed: calcium supplements, depression evaluation, low fat, low cholesterol diet, safe sex/STD prevention, self breast exams, smoking cessation, and weight bearing exercise. ?Follow up in: 1 year.  ? ? ? 05/08/2021, MD ?05/15/2021 8:55 AM  ?

## 2021-05-15 NOTE — Progress Notes (Signed)
Pt presents for AEX.  ?Last Pap 04/18/2020. ?Denies vaginal, urinary symptoms today.  ?Declines STD screen, blood work.  ?

## 2021-05-18 LAB — CERVICOVAGINAL ANCILLARY ONLY
Bacterial Vaginitis (gardnerella): NEGATIVE
Candida Glabrata: NEGATIVE
Candida Vaginitis: NEGATIVE
Chlamydia: NEGATIVE
Comment: NEGATIVE
Comment: NEGATIVE
Comment: NEGATIVE
Comment: NEGATIVE
Comment: NEGATIVE
Comment: NORMAL
Neisseria Gonorrhea: NEGATIVE
Trichomonas: NEGATIVE

## 2021-05-18 LAB — CYTOLOGY - PAP: Diagnosis: NEGATIVE

## 2021-06-02 ENCOUNTER — Other Ambulatory Visit: Payer: Self-pay | Admitting: Obstetrics

## 2021-06-02 DIAGNOSIS — Z30011 Encounter for initial prescription of contraceptive pills: Secondary | ICD-10-CM

## 2021-06-29 ENCOUNTER — Other Ambulatory Visit: Payer: Self-pay | Admitting: Obstetrics

## 2021-06-29 DIAGNOSIS — Z30011 Encounter for initial prescription of contraceptive pills: Secondary | ICD-10-CM

## 2021-07-02 ENCOUNTER — Other Ambulatory Visit: Payer: Self-pay | Admitting: *Deleted

## 2021-07-02 DIAGNOSIS — Z30011 Encounter for initial prescription of contraceptive pills: Secondary | ICD-10-CM

## 2021-07-02 MED ORDER — NORETHIN ACE-ETH ESTRAD-FE 1-20 MG-MCG PO TABS: 1.0000 | ORAL_TABLET | Freq: Every day | ORAL | 10 refills | Status: DC

## 2021-07-02 NOTE — Progress Notes (Signed)
Request OCP refills. Annual 05/15/21. Refills sent through 04/2022. Approved by Dr. Clearance Coots.

## 2022-04-07 ENCOUNTER — Other Ambulatory Visit: Payer: Self-pay | Admitting: Emergency Medicine

## 2022-04-07 DIAGNOSIS — Z30011 Encounter for initial prescription of contraceptive pills: Secondary | ICD-10-CM

## 2022-04-07 MED ORDER — NORETHIN ACE-ETH ESTRAD-FE 1-20 MG-MCG PO TABS: 1.0000 | ORAL_TABLET | Freq: Every day | ORAL | 3 refills | Status: DC

## 2022-04-07 NOTE — Progress Notes (Signed)
Rx for OCP until annual on 4/2

## 2022-05-18 ENCOUNTER — Other Ambulatory Visit (HOSPITAL_COMMUNITY)
Admission: RE | Admit: 2022-05-18 | Discharge: 2022-05-18 | Disposition: A | Discharge status: Home / Self Care | Payer: 59 | Source: Ambulatory Visit | Attending: Obstetrics | Admitting: Obstetrics

## 2022-05-18 ENCOUNTER — Ambulatory Visit (INDEPENDENT_AMBULATORY_CARE_PROVIDER_SITE_OTHER): Payer: 59 | Admitting: Obstetrics

## 2022-05-18 ENCOUNTER — Encounter: Payer: Self-pay | Admitting: Obstetrics

## 2022-05-18 VITALS — BP 125/76 | HR 94 | Ht 63.0 in | Wt 166.0 lb

## 2022-05-18 DIAGNOSIS — Z113 Encounter for screening for infections with a predominantly sexual mode of transmission: Secondary | ICD-10-CM

## 2022-05-18 DIAGNOSIS — N898 Other specified noninflammatory disorders of vagina: Secondary | ICD-10-CM | POA: Insufficient documentation

## 2022-05-18 DIAGNOSIS — Z1339 Encounter for screening examination for other mental health and behavioral disorders: Secondary | ICD-10-CM | POA: Diagnosis not present

## 2022-05-18 DIAGNOSIS — Z3041 Encounter for surveillance of contraceptive pills: Secondary | ICD-10-CM

## 2022-05-18 DIAGNOSIS — Z01419 Encounter for gynecological examination (general) (routine) without abnormal findings: Secondary | ICD-10-CM

## 2022-05-18 DIAGNOSIS — N946 Dysmenorrhea, unspecified: Secondary | ICD-10-CM | POA: Diagnosis not present

## 2022-05-18 MED ORDER — IBUPROFEN 800 MG PO TABS: 800.0000 mg | ORAL_TABLET | Freq: Three times a day (TID) | ORAL | 5 refills | Status: DC | PRN

## 2022-05-18 MED ORDER — NORETHIN ACE-ETH ESTRAD-FE 1-20 MG-MCG(24) PO TABS: 1.0000 | ORAL_TABLET | Freq: Every day | ORAL | 11 refills | Status: DC

## 2022-05-18 NOTE — Progress Notes (Signed)
37 y.o GYN presents for AEX/PAP.  C/o heavy periods, abdominal pain 7/10 x 2 weeks.

## 2022-05-18 NOTE — Progress Notes (Signed)
Subjective:        Kathleen Powell is a 37 y.o. female here for a routine exam.  Current complaints: OCP's are causing heavier periods.  Also c/o vaginal discharge..    Personal health questionnaire:  Is patient [name], have a family history of breast and/or ovarian cancer: no Is there a family history of uterine cancer diagnosed at age < 2, gastrointestinal cancer, urinary tract cancer, family member who is a Field seismologist syndrome-associated carrier: yes Is the patient overweight and hypertensive, family history of diabetes, personal history of gestational diabetes, preeclampsia or PCOS: no Is patient over 109, have PCOS,  family history of premature CHD under age 12, diabetes, smoke, have hypertension or peripheral artery disease:  no At any time, has a partner hit, kicked or otherwise hurt or frightened you?: no Over the past 2 weeks, have you felt down, depressed or hopeless?: no Over the past 2 weeks, have you felt little interest or pleasure in doing things?:no   Gynecologic History Patient's last menstrual period was 05/04/2022 (exact date). Contraception: OCP (estrogen/progesterone) Last Pap: 2023. Results were: normal Last mammogram: n/a. Results were: n/a  Obstetric History OB History  Gravida Para Term Preterm AB Living  1 1 1     1   SAB IAB Ectopic Multiple Live Births          1    # Outcome Date GA Lbr Len/2nd Weight Sex Delivery Anes PTL Lv  1 Term 11/27/11 [redacted]w[redacted]d  6 lb 3.3 oz (2.815 kg) M CS-LTranv EPI  LIV    Past Medical History:  Diagnosis Date   Migraine    Migraine    No pertinent past medical history    Tobacco abuse    Vertigo     Past Surgical History:  Procedure Laterality Date   CESAREAN SECTION  11/27/2011   Procedure: CESAREAN SECTION;  Surgeon: Frederico Hamman, MD;  Location: [facility];  Service: Obstetrics;  Laterality: N/A;  Primary cesarean section with delivery of baby boy at 21. Apgars 8/9.     Current Outpatient Medications:     ibuprofen (ADVIL) 800 MG tablet, Take 1 tablet (800 mg total) by mouth every 8 (eight) hours as needed., Disp: 30 tablet, Rfl: 5   Norethindrone Acetate-Ethinyl Estrad-FE (LOESTRIN 24 FE) 1-20 MG-MCG(24) tablet, Take 1 tablet by mouth daily., Disp: 28 tablet, Rfl: 11   cyanocobalamin (,VITAMIN B-12,) 1000 MCG/ML injection, Inject 30ml in deltoid once weekly for 4 weeks, then inject 1 ml once a month thereafter (Patient not taking: Reported on 05/18/2022), Disp: 6 mL, Rfl: 11   propranolol ER (INDERAL LA) 120 MG 24 hr capsule, Take 1 capsule (120 mg total) by mouth daily. (Patient not taking: Reported on 05/15/2021), Disp: 90 capsule, Rfl: 3   SUMAtriptan (IMITREX) 50 MG tablet, Take 1 tablet (50 mg total) by mouth every 2 (two) hours as needed for migraine. May repeat in 2 hours if headache persists or recurs. (Patient not taking: Reported on 05/15/2021), Disp: 10 tablet, Rfl: 11   SYRINGE-NEEDLE, DISP, 3 ML (BD SAFETYGLIDE SYRINGE/NEEDLE) 25G X 1" 3 ML MISC, Use to inject B12 (Patient not taking: Reported on 05/15/2021), Disp: 100 each, Rfl: 11 No Known Allergies  Social History   Tobacco Use   Smoking status: Every Day    Packs/day: .25    Types: Cigarettes    Last attempt to quit: 09/09/2011    Years since quitting: 10.6   Smokeless tobacco: Former   Tobacco comments:  Recently quit has not smoked in 3 months   Substance Use Topics   Alcohol use: Yes    Comment: "once a weekend"    Family History  Problem Relation Age of Onset   Hypertension Mother    Cancer Mother        thyroid   Cataracts Mother    Heart disease Father        pace maker   Kidney failure Father    Asthma Sister    Heart disease Maternal Grandmother        congestive heart failure   Hypertension Maternal Grandmother    Heart attack Maternal Grandmother    Gout Maternal Grandfather    Colon cancer Paternal Grandfather    Colon cancer Paternal Uncle    Anesthesia problems Neg Hx       Review of  Systems  Constitutional: negative for fatigue and weight loss Respiratory: negative for cough and wheezing Cardiovascular: negative for chest pain, fatigue and palpitations Gastrointestinal: negative for abdominal pain and change in bowel habits Musculoskeletal:negative for myalgias Neurological: negative for gait problems and tremors Behavioral/Psych: negative for abusive relationship, depression Endocrine: negative for temperature intolerance    Genitourinary: positive for vaginal discharge.  negative for abnormal menstrual periods, genital lesions, hot flashes, sexual problems  Integument/breast: negative for breast lump, breast tenderness, nipple discharge and skin lesion(s)    Objective:       BP 125/76   Pulse 94   Ht 5\' 3"  (1.6 m)   Wt 166 lb (75.3 kg)   LMP 05/04/2022 (Exact Date)   BMI 29.41 kg/m  General:   Alert and no distress  Skin:   no rash or abnormalities  Lungs:   clear to auscultation bilaterally  Heart:   regular rate and rhythm, S1, S2 normal, no murmur, click, rub or gallop  Breasts:   normal without suspicious masses, skin or nipple changes or axillary nodes  Abdomen:  normal findings: no organomegaly, soft, non-tender and no hernia  Pelvis:  External genitalia: normal general appearance Urinary system: urethral meatus normal and bladder without fullness, nontender Vaginal: normal without tenderness, induration or masses Cervix: normal appearance Adnexa: normal bimanual exam Uterus: anteverted and non-tender, normal size   Lab Review Urine pregnancy test Labs reviewed yes Radiologic studies reviewed no  I have spent a total of 20 minutes of face-to-face aime, excluding clinical staff time, reviewing notes and preparing to see patient, ordering tests and/or medications, and counseling the patient.   Assessment:    1. Encounter for routine gynecological examination with Papanicolaou smear of cervix Rx: - Cytology - PAP( [place])  2.  Dysmenorrhea Rx: - ibuprofen (ADVIL) 800 MG tablet; Take 1 tablet (800 mg total) by mouth every 8 (eight) hours as needed.  Dispense: 30 tablet; Refill: 5  3. Vaginal discharge Rx: - Cervicovaginal ancillary only( [place])  4. Screening examination for STD (sexually transmitted disease) Rx: - HIV antibody (with reflex) - Hepatitis C Antibody - Hepatitis B Surface AntiGEN - RPR  5. Encounter for surveillance of contraceptive pills Rx: - Norethindrone Acetate-Ethinyl Estrad-FE (LOESTRIN 24 FE) 1-20 MG-MCG(24) tablet; Take 1 tablet by mouth daily.  Dispense: 28 tablet; Refill: 11     Plan:     Follow up in 1 year  Meds ordered this encounter  Medications   Norethindrone Acetate-Ethinyl Estrad-FE (LOESTRIN 24 FE) 1-20 MG-MCG(24) tablet    Sig: Take 1 tablet by mouth daily.    Dispense:  28 tablet  Refill:  11   ibuprofen (ADVIL) 800 MG tablet    Sig: Take 1 tablet (800 mg total) by mouth every 8 (eight) hours as needed.    Dispense:  30 tablet    Refill:  5   Orders Placed This Encounter  Procedures   HIV antibody (with reflex)   Hepatitis C Antibody   Hepatitis B Surface AntiGEN   RPR    Shelly Bombard, MD 05/18/2022 9:34 AM

## 2022-05-18 NOTE — Transitions of Care (Post Inpatient/ED Visit) (Deleted)
   05/18/2022  Name: Ali Harralson MRN: [id number] DOB: [birth year]  {[id number]}

## 2022-05-19 LAB — CERVICOVAGINAL ANCILLARY ONLY
Bacterial Vaginitis (gardnerella): POSITIVE — AB
Candida Glabrata: NEGATIVE
Candida Vaginitis: NEGATIVE
Chlamydia: NEGATIVE
Comment: NEGATIVE
Comment: NEGATIVE
Comment: NEGATIVE
Comment: NEGATIVE
Comment: NEGATIVE
Comment: NORMAL
Neisseria Gonorrhea: NEGATIVE
Trichomonas: NEGATIVE

## 2022-05-19 LAB — RPR: RPR Ser Ql: NONREACTIVE

## 2022-05-19 LAB — HIV ANTIBODY (ROUTINE TESTING W REFLEX): HIV Screen 4th Generation wRfx: NONREACTIVE

## 2022-05-19 LAB — HEPATITIS C ANTIBODY: Hep C Virus Ab: NONREACTIVE

## 2022-05-19 LAB — HEPATITIS B SURFACE ANTIGEN: Hepatitis B Surface Ag: NEGATIVE

## 2022-05-20 ENCOUNTER — Other Ambulatory Visit: Payer: Self-pay | Admitting: Obstetrics

## 2022-05-20 DIAGNOSIS — N760 Acute vaginitis: Secondary | ICD-10-CM

## 2022-05-20 LAB — CYTOLOGY - PAP
Comment: NEGATIVE
Diagnosis: NEGATIVE
High risk HPV: NEGATIVE

## 2022-05-20 MED ORDER — METRONIDAZOLE 500 MG PO TABS: 500.0000 mg | ORAL_TABLET | Freq: Two times a day (BID) | ORAL | 2 refills | Status: DC

## 2022-05-21 ENCOUNTER — Encounter: Payer: Self-pay | Admitting: Family Medicine

## 2022-05-21 ENCOUNTER — Ambulatory Visit: Payer: 59 | Admitting: Family Medicine

## 2022-05-21 VITALS — BP 110/80 | HR 79 | Temp 98.5°F | Wt 167.8 lb

## 2022-05-21 DIAGNOSIS — N939 Abnormal uterine and vaginal bleeding, unspecified: Secondary | ICD-10-CM

## 2022-05-21 DIAGNOSIS — N760 Acute vaginitis: Secondary | ICD-10-CM

## 2022-05-21 DIAGNOSIS — B9689 Other specified bacterial agents as the cause of diseases classified elsewhere: Secondary | ICD-10-CM

## 2022-05-21 LAB — POCT URINE PREGNANCY: Preg Test, Ur: NEGATIVE

## 2022-05-21 NOTE — Progress Notes (Signed)
   Subjective:    Patient ID: Kathleen Powell, female    DOB: 04-Oct-1985, 37 y.o.   MRN: [id number]  HPI Here asking for a pregnancy test. She normally has regular menses with light bleeding, but 2 weeks ago when her menses started she had heavy bleeding that would not stop as well as pelvic pains. No fever. No urinary symptoms. She saw her GYN, Dr. Coral Ceo, who did a pelvic exam and a Pap smear. She was tested for STD's, and she was found to have bacterial vaginosis. He also discovered that the pharmacy had given her the wrong BCP the last time she had it filled. He felt that the change in BCP was the cause of her bleeding. He changed her back to her original BCP, and she is taking Metronidazole for the BV. However after reviewing her paperwork, [place] discovered that no pregnancy test was done to rule out a miscarriage. Today she feels better, but she still has some mild cramps and some light bleeding. Her pregnancy test today is negative.    Review of Systems  Constitutional: Negative.   Respiratory: Negative.    Cardiovascular: Negative.   Gastrointestinal: Negative.   Genitourinary:  Positive for pelvic pain, vaginal bleeding and vaginal discharge.       Objective:   Physical Exam Constitutional:      Appearance: Normal appearance. She is not ill-appearing.  Cardiovascular:     Rate and Rhythm: Normal rate and regular rhythm.     Pulses: Normal pulses.     Heart sounds: Normal heart sounds.  Pulmonary:     Effort: Pulmonary effort is normal.     Breath sounds: Normal breath sounds.  Abdominal:     General: Abdomen is flat. Bowel sounds are normal. There is no distension.     Palpations: Abdomen is soft. There is no mass.     Tenderness: There is no right CVA tenderness, left CVA tenderness, guarding or rebound.     Hernia: No hernia is present.     Comments: Mild suprapubic tenderness   Neurological:     Mental Status: She is alert.           Assessment & Plan:   She is having vaginal bleeding, presumably from a change in her BCP. She is also being treated for BV. She will follow up as needed.  Gershon Crane, MD

## 2022-05-21 NOTE — Addendum Note (Signed)
Addended by: Carola Rhine on: 05/21/2022 11:47 AM   Modules accepted: Orders

## 2022-06-02 IMAGING — DX DG LUMBAR SPINE COMPLETE 4+V
5 series · 5 of 5 positions shown · non-contrast
Comparison: None.

CLINICAL DATA: 34-year-old female with back pain after fall.

EXAM:
LUMBAR SPINE - COMPLETE 4+ VIEW

[l-spine ap]
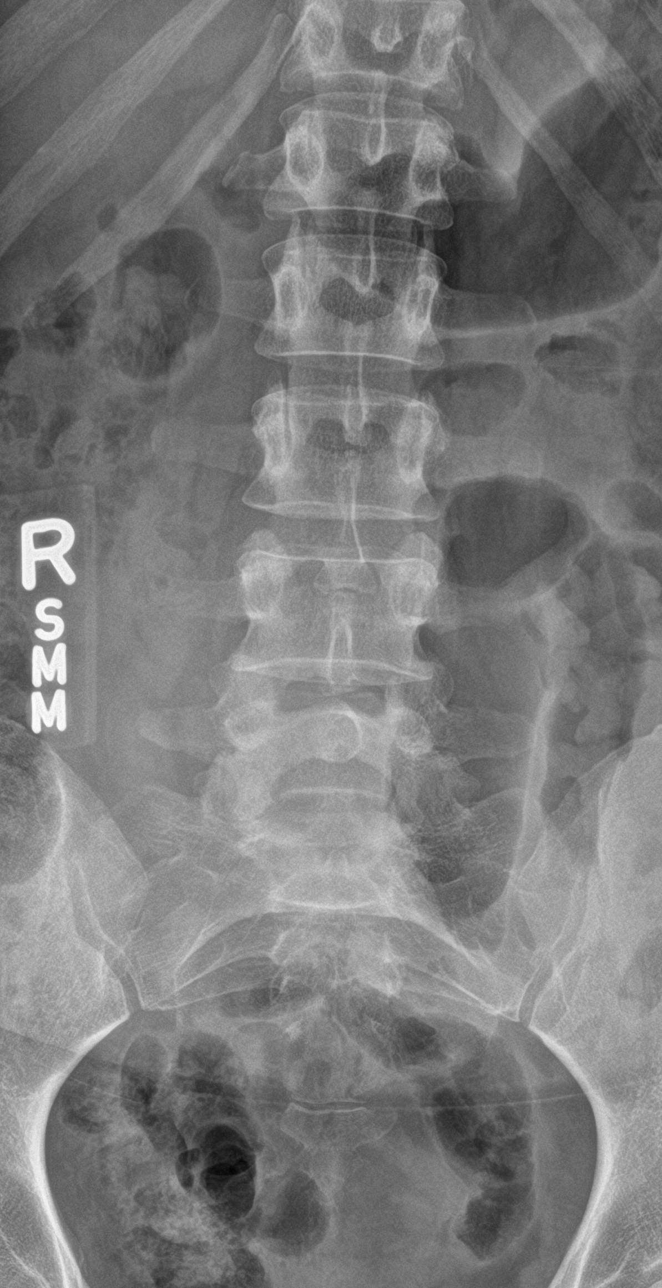

[l-spine obl (1 of 2)]
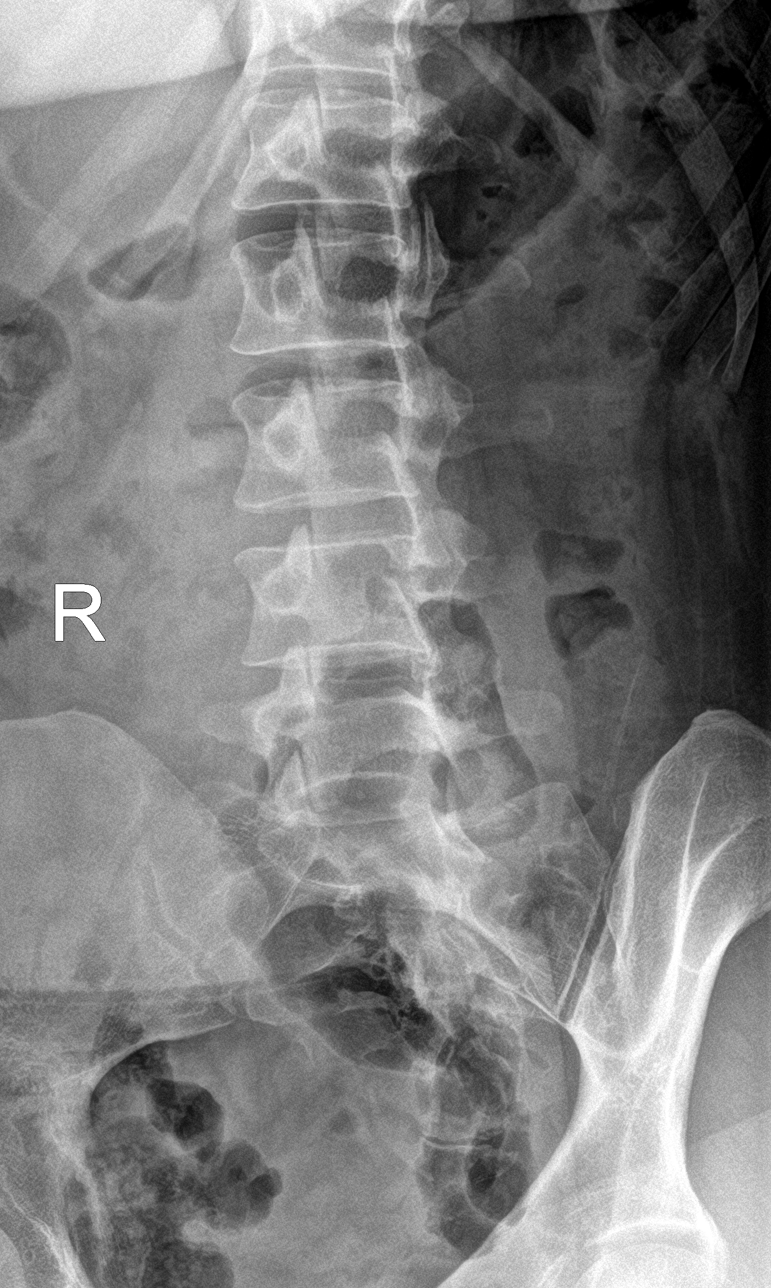

[l-spine obl (2 of 2)]
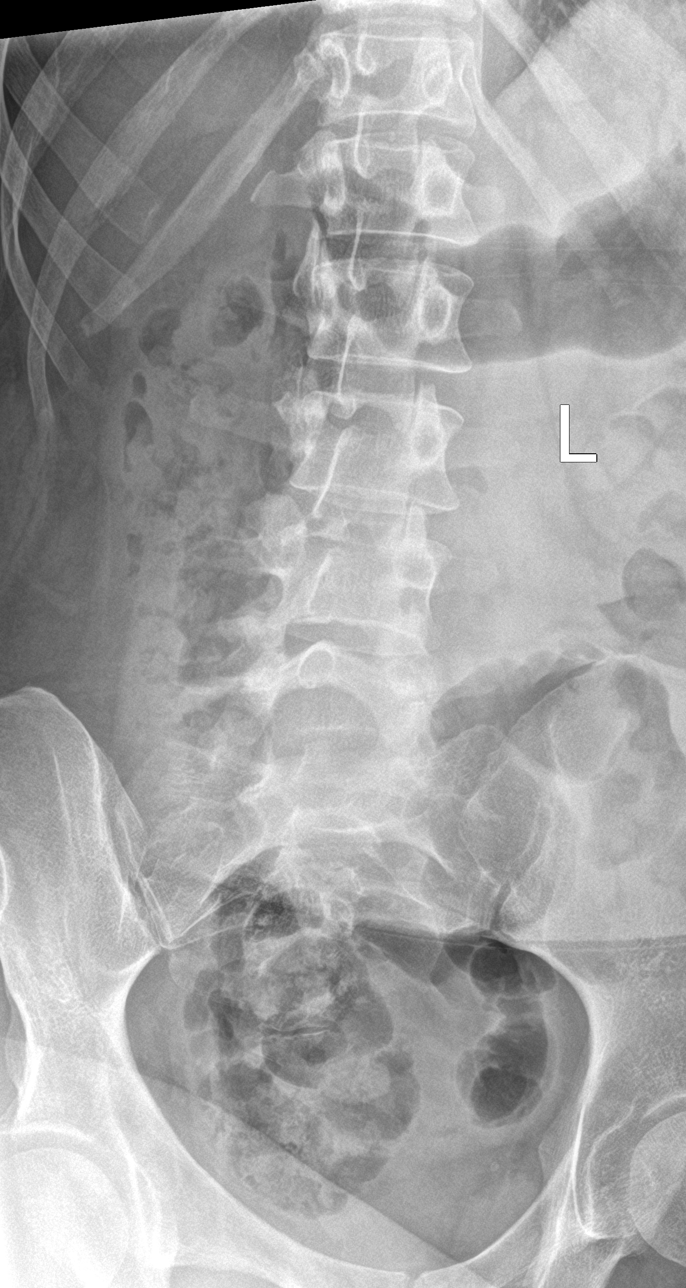

[l-spine lat]
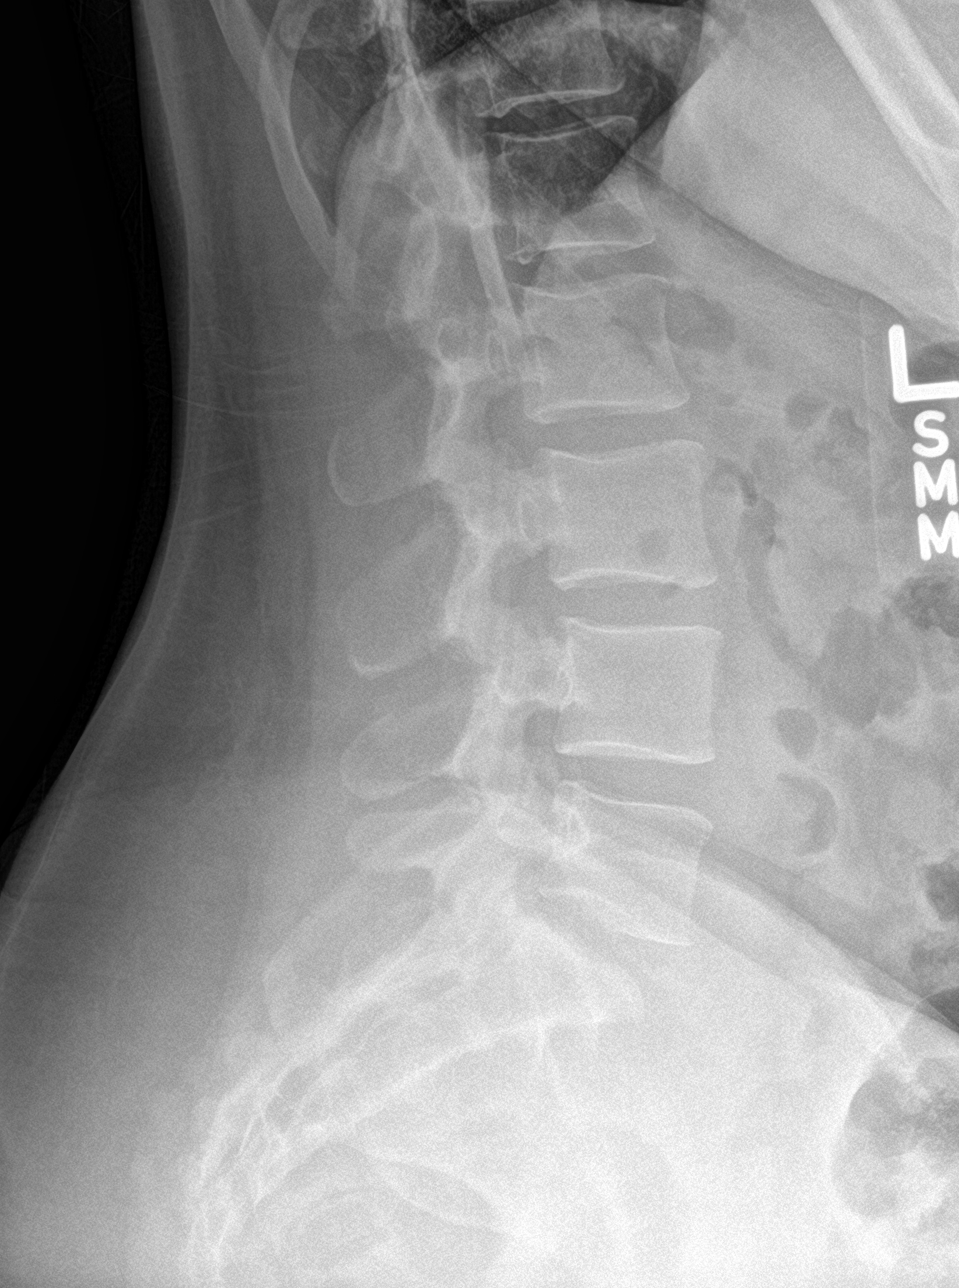

[l-spine spot]
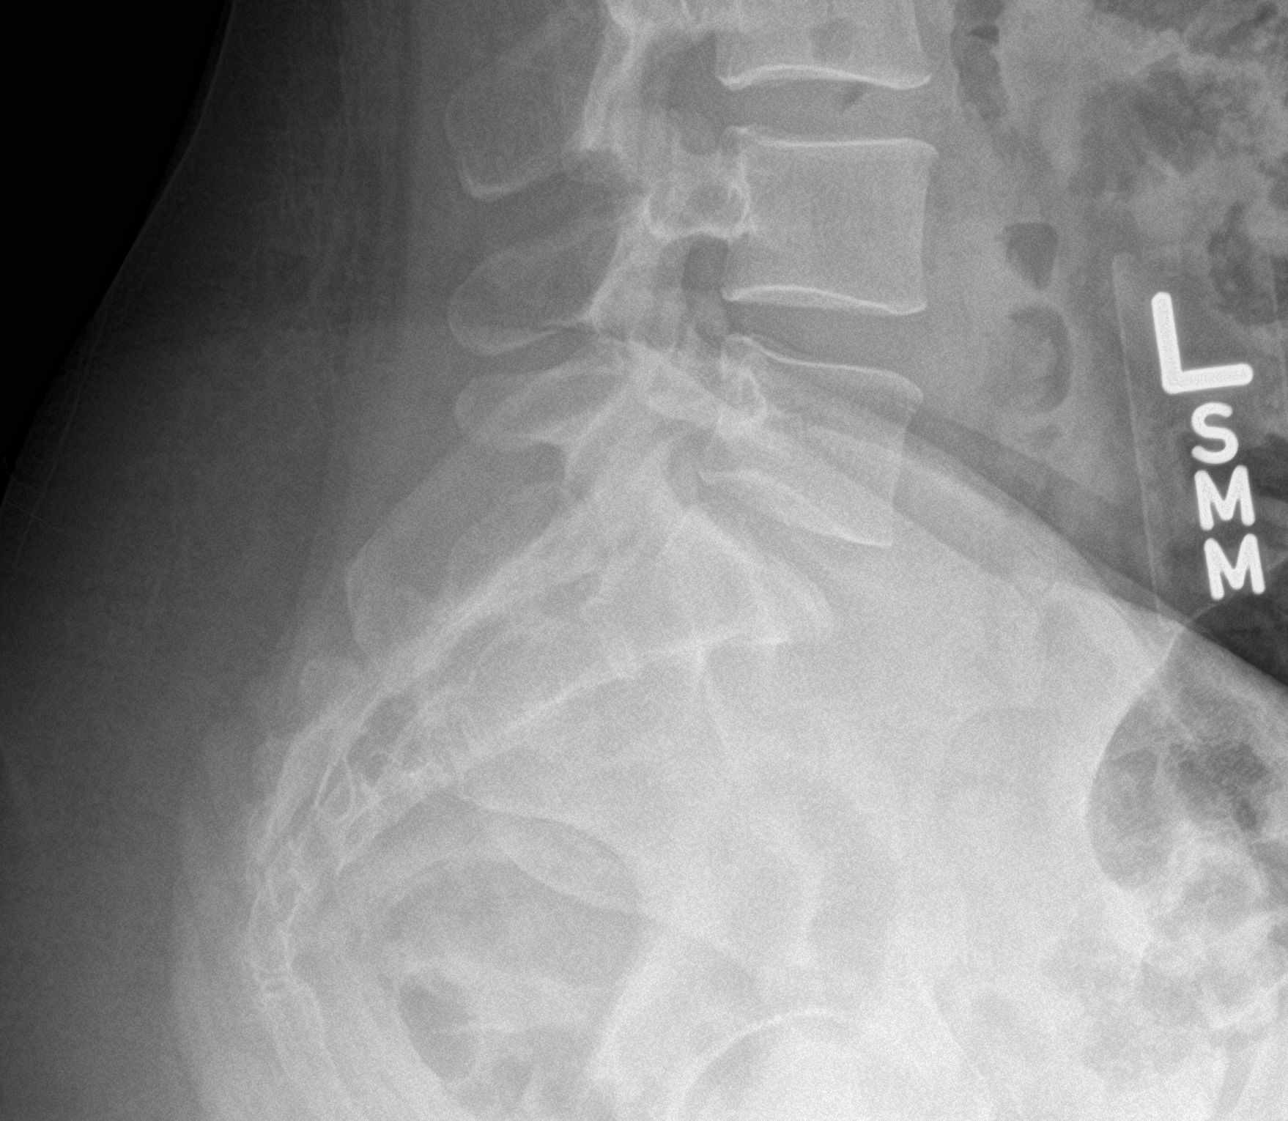

[5 of 5 positions shown; findings below may reference images not displayed]

FINDINGS: Five lumbar type vertebra. There is no acute fracture or subluxation
of the lumbar spine. The vertebral body heights and disc spaces are
maintained. The visualized posterior elements are intact. The soft
tissues are unremarkable.
IMPRESSION: Negative.

## 2022-08-18 ENCOUNTER — Ambulatory Visit: Payer: 59 | Admitting: Family Medicine

## 2022-08-18 ENCOUNTER — Encounter: Payer: Self-pay | Admitting: Family Medicine

## 2022-08-18 VITALS — BP 100/76 | HR 94 | Temp 98.3°F | Wt 163.0 lb

## 2022-08-18 DIAGNOSIS — M7062 Trochanteric bursitis, left hip: Secondary | ICD-10-CM

## 2022-08-18 MED ORDER — METHYLPREDNISOLONE 4 MG PO TBPK: ORAL_TABLET | ORAL | 0 refills | Status: DC

## 2022-08-18 NOTE — Progress Notes (Signed)
   Subjective:    Patient ID: Kathleen Powell, female    DOB: February 19, 1985, 37 y.o.   MRN: [id number]  HPI Here for 3 weeks of a sharp pain in the left hip area. No hx of trauma. No back pain. This bothers her the most when she is lying in bed at night, not so much during the day. Ibuprofen helps a little.    Review of Systems  Constitutional: Negative.   Respiratory: Negative.    Cardiovascular: Negative.   Musculoskeletal:  Positive for arthralgias.       Objective:   Physical Exam Constitutional:      General: She is not in acute distress.    Appearance: Normal appearance.  Cardiovascular:     Rate and Rhythm: Normal rate and regular rhythm.     Pulses: Normal pulses.     Heart sounds: Normal heart sounds.  Pulmonary:     Effort: Pulmonary effort is normal.     Breath sounds: Normal breath sounds.  Musculoskeletal:     Comments: Tender over the left greater trochanter   Neurological:     Mental Status: She is alert.           Assessment & Plan:  Trochanteric bursitis. We will treat with a Medrol dose pack, and she will apply ice TID. Recheck as needed. Gershon Crane, MD

## 2022-08-24 ENCOUNTER — Encounter: Payer: 59 | Admitting: Family Medicine

## 2022-08-30 ENCOUNTER — Emergency Department (HOSPITAL_BASED_OUTPATIENT_CLINIC_OR_DEPARTMENT_OTHER)
Admission: EM | Admit: 2022-08-30 | Discharge: 2022-08-31 | Disposition: A | Discharge status: Home / Self Care | Payer: 59 | Attending: Emergency Medicine | Admitting: Emergency Medicine

## 2022-08-30 ENCOUNTER — Encounter (HOSPITAL_BASED_OUTPATIENT_CLINIC_OR_DEPARTMENT_OTHER): Payer: Self-pay

## 2022-08-30 DIAGNOSIS — R410 Disorientation, unspecified: Secondary | ICD-10-CM | POA: Diagnosis not present

## 2022-08-30 DIAGNOSIS — M79652 Pain in left thigh: Secondary | ICD-10-CM | POA: Insufficient documentation

## 2022-08-30 NOTE — ED Triage Notes (Signed)
Patient here POV from Home.  Endorses Left Hip Pain that began 1 Month ago. Became better after taking Steroids with PCP. Subsided but returned soon after 1 Week ago when she finished the medication.  No Known Trauma or Injury. Pain is from left Hip down to Leg. No Dysuria No Fevers.   NAD noted during Triage, A&Ox4. Gcs 15. BIB Wheelchair.

## 2022-08-31 ENCOUNTER — Encounter: Payer: Self-pay | Admitting: Family Medicine

## 2022-08-31 ENCOUNTER — Encounter: Payer: Self-pay | Admitting: Physical Therapy

## 2022-08-31 ENCOUNTER — Ambulatory Visit: Payer: 59 | Admitting: Family Medicine

## 2022-08-31 ENCOUNTER — Other Ambulatory Visit: Payer: Self-pay

## 2022-08-31 ENCOUNTER — Ambulatory Visit: Payer: 59 | Attending: Family Medicine | Admitting: Physical Therapy

## 2022-08-31 VITALS — BP 110/78 | HR 82 | Temp 98.3°F | Wt 161.2 lb

## 2022-08-31 DIAGNOSIS — M79652 Pain in left thigh: Secondary | ICD-10-CM | POA: Insufficient documentation

## 2022-08-31 DIAGNOSIS — M6289 Other specified disorders of muscle: Secondary | ICD-10-CM | POA: Insufficient documentation

## 2022-08-31 DIAGNOSIS — M6281 Muscle weakness (generalized): Secondary | ICD-10-CM | POA: Diagnosis present

## 2022-08-31 MED ORDER — PREDNISONE 50 MG PO TABS
60.0000 mg | ORAL_TABLET | Freq: Once | ORAL | Status: AC
  Administered 2022-08-31: 60 mg via ORAL
  Filled 2022-08-31: qty 1

## 2022-08-31 MED ORDER — PREDNISONE 20 MG PO TABS: ORAL_TABLET | ORAL | 0 refills | Status: DC

## 2022-08-31 MED ORDER — HYDROCODONE-ACETAMINOPHEN 5-325 MG PO TABS: 1.0000 | ORAL_TABLET | Freq: Four times a day (QID) | ORAL | 0 refills | Status: DC | PRN

## 2022-08-31 MED ORDER — HYDROCODONE-ACETAMINOPHEN 5-325 MG PO TABS
2.0000 | ORAL_TABLET | Freq: Once | ORAL | Status: DC
  Filled 2022-08-31: qty 2

## 2022-08-31 NOTE — ED Notes (Signed)
 RN reviewed discharge instructions with pt. Pt verbalized understanding and had no further questions

## 2022-08-31 NOTE — Progress Notes (Signed)
   Subjective:    Patient ID: Kathleen Powell, female    DOB: Jan 24, 1986, 37 y.o.   MRN: [id number]  HPI Here to follow up on an ED visit yesterday for upper left thigh pain. We saw her on 08-18-22, and we diagnosed her with a trochanteric bursitis. We treated her with a Medrol dose pack and ice. She felt better for awhile, but then the pain returned. She says the pain has actually moved down the lateral thigh towards the knee. At the ED yesterday, they gave her a few Norco pills for pain, and they gave her another Prednisone taper beginning with 60 mg a day. Today she feels a little better. Still no back pain.    Review of Systems  Constitutional: Negative.   Respiratory: Negative.    Cardiovascular: Negative.   Musculoskeletal:  Positive for myalgias. Negative for back pain.       Objective:   Physical Exam Constitutional:      General: She is not in acute distress.    Appearance: Normal appearance.  Cardiovascular:     Rate and Rhythm: Normal rate and regular rhythm.     Pulses: Normal pulses.     Heart sounds: Normal heart sounds.  Pulmonary:     Effort: Pulmonary effort is normal.     Breath sounds: Normal breath sounds.  Musculoskeletal:     Comments: Today she is not tender over the greater trochanter, but she is tender along the central parts of the lateral thigh. Full ROM  Neurological:     Mental Status: She is alert.           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 She will continue the Prednisone taper, and we will refer her to PT for treatment. Gershon Crane, MD

## 2022-08-31 NOTE — Discharge Instructions (Signed)
Begin taking prednisone as prescribed.  Begin taking hydrocodone as prescribed as needed for pain.  Follow-up with your primary doctor tomorrow as scheduled.

## 2022-08-31 NOTE — ED Provider Notes (Signed)
[place] EMERGENCY DEPARTMENT AT [facility] Provider Note   CSN: [id number] Arrival date & time: 08/30/22  2258     History  Chief Complaint  Patient presents with   Hip Pain    Kathleen Powell is a 37 y.o. female.  Patient is a 37 year old female with no significant past medical history.  Patient presenting with complaints of pain to the left thigh.  This has been ongoing for the past several weeks.  She was seen by her primary doctor and completed a steroid Dosepak 2 weekends ago.  This did seem to help, however her pain has now returned.  She denies any specific injury or trauma.  Pain is located to the lateral aspect of the thigh just below the hip and radiates toward the knee.  She denies any weakness or numbness.  Pain is worse with movement.  The history is provided by the patient.       Home Medications Prior to Admission medications   Medication Sig Start Date End Date Taking? Authorizing Provider  ibuprofen (ADVIL) 800 MG tablet Take 1 tablet (800 mg total) by mouth every 8 (eight) hours as needed. 05/18/22   Brock Bad, MD  methylPREDNISolone (MEDROL DOSEPAK) 4 MG TBPK tablet As directed 08/18/22   Nelwyn Salisbury, MD  Norethindrone Acetate-Ethinyl Estrad-FE (LOESTRIN 24 FE) 1-20 MG-MCG(24) tablet Take 1 tablet by mouth daily. 05/18/22   Brock Bad, MD      Allergies    Patient has no known allergies.    Review of Systems   Review of Systems  All other systems reviewed and are negative.   Physical Exam Updated Vital Signs BP (!) 146/95 (BP Location: Right Arm)   Pulse 97   Temp 98.6 F (37 C)   Resp 18   Ht 5\' 4"  (1.626 m)   Wt 76.2 kg   SpO2 100%   BMI 28.84 kg/m  Physical Exam Vitals and nursing note reviewed.  Constitutional:      Appearance: Normal appearance.  Pulmonary:     Effort: Pulmonary effort is normal.  Musculoskeletal:     Comments: The left hip and thigh are grossly normal in appearance.  She has pain with  external rotation of the thigh.  DP pulses are palpable and motor and sensation are intact throughout the entire foot.  Skin:    General: Skin is warm and dry.  Neurological:     Mental Status: She is alert. She is disoriented.     ED Results / Procedures / Treatments   Labs (all labs ordered are listed, but only abnormal results are displayed) Labs Reviewed - No data to display  EKG None  Radiology No results found.  Procedures Procedures    Medications Ordered in ED Medications  HYDROcodone-acetaminophen (NORCO/VICODIN) 5-325 MG per tablet 2 tablet (has no administration in time range)  predniSONE (DELTASONE) tablet 60 mg (has no administration in time range)    ED Course/ Medical Decision Making/ A&P  Patient presenting with left thigh pain as described in the HPI.  Symptoms seem very musculoskeletal in nature.  She recently completed a course of steroids.  Patient will be started back on prednisone, prescribed pain medication, and is to follow-up with her primary doctor.  She tells me she has an appointment scheduled for tomorrow.  Final Clinical Impression(s) / ED Diagnoses Final diagnoses:  None    Rx / DC Orders ED Discharge Orders     None  Geoffery Lyons, MD 08/31/22 [phone]

## 2022-08-31 NOTE — Therapy (Signed)
OUTPATIENT PHYSICAL THERAPY LOWER EXTREMITY EVALUATION   Patient Name: Kathleen Powell MRN: [id number] DOB:1985-04-13, 37 y.o., female Today's Date: 08/31/2022  END OF SESSION:  PT End of Session - 08/31/22 1528     Visit Number 1    Date for PT Re-Evaluation 10/26/22    Authorization Type [employer]    PT Start Time 1530    PT Stop Time 1612    PT Time Calculation (min) 42 min    Activity Tolerance Patient tolerated treatment well             Past Medical History:  Diagnosis Date   Migraine    Migraine    No pertinent past medical history    Tobacco abuse    Vertigo    Past Surgical History:  Procedure Laterality Date   CESAREAN SECTION  11/27/2011   Procedure: CESAREAN SECTION;  Surgeon: Kathreen Cosier, MD;  Location: [facility];  Service: Obstetrics;  Laterality: N/A;  Primary cesarean section with delivery of baby boy at 65. Apgars 8/9.   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  Get a new dose pack today to restart.  Left anterior thigh to mid thigh; Not typically posterior or lateral. Limping on arrival; some back pain last few days but not on  onset  PERTINENT HISTORY: Had PT for back at [facility] 2 years ago (have a little ache sometimes) PAIN:  PAIN:  Are you having pain? Yes NPRS scale: 7-8/10 Pain location: left mid anterior thigh  Aggravating factors: after sitting (especially at work); going down the steps really stiff (feels like it might give out); turning over in bed Relieving factors: walking; butterfly Mixed results on massage gun   PRECAUTIONS: None     WEIGHT BEARING RESTRICTIONS: No  FALLS:  Has patient fallen in last 6 months? No  LIVING ENVIRONMENT: Lives with: lives with their family Lives in: House/apartment Stairs: No   OCCUPATION: mostly sitting [employer], a little walking  PLOF: Independent  PATIENT GOALS:want this goes away; [place] in 2 weeks (flight, walking); walk the track every evening  NEXT MD VISIT: as needed  OBJECTIVE:   DIAGNOSTIC FINDINGS: none  PATIENT SURVEYS:  LEFS 35/80  COGNITION: Overall cognitive status: Within functional limits for tasks assessed      MUSCLE LENGTH: Decreased hip flexor/quad length bilaterally (noted in sidelying)  PALPATION: A few tender points left lateral quad mid but does not reproduce the exact pain  LUMBAR SCREEN: some production of symptoms with standing flexion in standing, right sidebend produces left thigh symptoms; prone extension "feels good."  Pain produced on left with slump test and with prone knee bend   LOWER EXTREMITY ROM: WFLs  LOWER EXTREMITY MMT: Offset (less weight on left) but able to rise without UE assist  MMT Right eval Left eval  Hip flexion 5 4  Hip extension 5 4-  Hip abduction 5 4-  Hip adduction    Hip internal rotation    Hip external rotation 5 4-  Knee flexion    Knee extension 5 4  Ankle dorsiflexion    Ankle plantarflexion    Ankle inversion    Ankle eversion     (Blank rows = not tested)    FUNCTIONAL TESTS:  SLS right 25 sec; left 10 sec very wobbly +lateral  trunk lean  GAIT:  Comments: +limp, decreased stance time on left   TODAY'S TREATMENT:                                                                                                                              DATE: 7/16:    PATIENT EDUCATION:  Education details: Educated patient on anatomy and physiology of current symptoms, prognosis, plan of care as well as initial self care strategies to promote recovery Person educated: Patient Education method: Explanation Education comprehension: verbalized understanding  HOME EXERCISE PROGRAM: Access Code: [email or web address] URL: [email or web address] Date: 08/31/2022 Prepared by: Lavinia Sharps  Exercises - Prone Press Up  - 1 x daily - 7 x weekly - 3 sets - 10 reps - Standing Hip Abduction  - 1 x daily - 7 x weekly - 3 sets - 10 reps - Standing Quadratus Lumborum Stretch with Doorway  - 1 x daily - 7 x weekly - 3 sets - 10 reps - Standing Lumbar Extension  - 1 x daily - 7 x weekly - 3 sets - 10 reps  ASSESSMENT:  CLINICAL IMPRESSION: Patient is a 37 y.o. female who was seen today for physical therapy evaluation and treatment for left anterior thigh pain which began 4-5 weeks ago for no apparent reason.  Symptoms are aggravated with walking after sitting (particularly at work), turning over in bed and with going up/down steps at work.  Functional limitations on LEFS outcome scale also indicate quite a bit of difficulty with squatting and hopping.  She has a history of back pain which has been well managed since PT 2 years ago.  With lumbar screening today she does have the production of symptoms with lumbar movements and neural mobilization.  Weakness in left hip particularly abductors and extensors and painful with active and resisted movements.  She has an upcoming trip to [facility] and would benefit from PT to address these deficits and functional limitations.   OBJECTIVE IMPAIRMENTS: decreased activity tolerance, difficulty  walking, decreased ROM, decreased strength, increased fascial restrictions, impaired perceived functional ability, and pain.   ACTIVITY LIMITATIONS: sitting, standing, squatting, sleeping, stairs, bed mobility, and locomotion level  PARTICIPATION LIMITATIONS: driving, community activity, and occupation  PERSONAL FACTORS: Past/current experiences include some history of LBP are also affecting patient's functional outcome.   REHAB POTENTIAL:  Good  CLINICAL DECISION MAKING: Stable/uncomplicated  EVALUATION COMPLEXITY: Low   GOALS: Goals reviewed with patient? Yes  SHORT TERM GOALS: Target date: 09/28/2022   The patient will demonstrate knowledge of basic self care strategies and exercises to promote healing  Baseline: Goal status: INITIAL  2.  The patient will have improved left hip strength to at least 4/5 needed for walking longer distances (for trip to [place])  and descending stairs at work without feeling of give-way Baseline:  Goal status: INITIAL  3.  The patient will have improved left LE needed to squat with minimal/moderate difficulty Baseline:  Goal status: INITIAL  4.  The patient will be able to walk 1/2 mile with pain rating <5/10 Baseline:  Goal status: INITIAL   LONG TERM GOALS: Target date: 10/26/2022   The patient will be independent in a safe self progression of a home exercise program to promote further recovery of function  Baseline:  Goal status: INITIAL  2.  The patient will have improved left hip strength to at least 4+/5 needed for lifting medium objects (luggage) and descending stairs at work with ease Baseline:  Goal status: INITIAL  3.  The patient will be able to sit for 6-8 hours at work (with rest breaks/stretch breaks throughout the day) with pain 4/10 Baseline:  Goal status: INITIAL  4.  The patient will be able to walk a mile with pain 3/10 Baseline:  Goal status: INITIAL  5.  LEFS improved to 45/80 indicating improved function with  less pain Baseline:  Goal status: INITIAL    PLAN:  PT FREQUENCY: 2x/week  PT DURATION: 8 weeks  PLANNED INTERVENTIONS: Therapeutic exercises, Therapeutic activity, Neuromuscular re-education, Balance training, Gait training, Patient/Family education, Self Care, Joint mobilization, Aquatic Therapy, Dry Needling, Electrical stimulation, Spinal mobilization, Cryotherapy, Moist heat, Taping, Traction, Ionotophoresis 4mg /ml Dexamethasone, Manual therapy, and Re-evaluation  PLAN FOR NEXT SESSION: assess response to prone and standing lumbar extensions to rule in/out referred thigh pain coming from lumbar spine; bil hip flexor lengthening;  manual therapy soft tissue mobilization/left hip joint mobs (DN not discussed); left gluteal strengthening; upcoming trip to [facility] [place] 08/31/22 5:03 PM Phone: [phone] Fax: [phone]

## 2022-09-02 ENCOUNTER — Ambulatory Visit: Payer: 59 | Admitting: Rehabilitative and Restorative Service Providers"

## 2022-09-02 ENCOUNTER — Encounter: Payer: Self-pay | Admitting: Rehabilitative and Restorative Service Providers"

## 2022-09-02 DIAGNOSIS — M79652 Pain in left thigh: Secondary | ICD-10-CM

## 2022-09-02 DIAGNOSIS — M6281 Muscle weakness (generalized): Secondary | ICD-10-CM

## 2022-09-02 NOTE — Therapy (Signed)
OUTPATIENT PHYSICAL THERAPY TREATMENT NOTE   Patient Name: Kathleen Powell MRN: [id number] DOB:Jun 16, 1985, 37 y.o., female Today's Date: 09/02/2022  END OF SESSION:  PT End of Session - 09/02/22 0730     Visit Number 2    Date for PT Re-Evaluation 10/26/22    Authorization Type [employer]    PT Start Time 0728    PT Stop Time 0800    PT Time Calculation (min) 32 min    Activity Tolerance Patient tolerated treatment well    Behavior During Therapy [facility] for tasks assessed/performed             Past Medical History:  Diagnosis Date   Migraine    Migraine    No pertinent past medical history    Tobacco abuse    Vertigo    Past Surgical History:  Procedure Laterality Date   CESAREAN SECTION  11/27/2011   Procedure: CESAREAN SECTION;  Surgeon: Kathreen Cosier, MD;  Location: [facility];  Service: Obstetrics;  Laterality: N/A;  Primary cesarean section with delivery of baby boy at 64. Apgars 8/9.   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  However, pt reports that yesterday when she finished working, she has increased pain.  PERTINENT HISTORY: Had PT for back at [facility] 2 years ago (have a little ache sometimes) PAIN:   PAIN:  Are you having pain? Yes NPRS scale: 4/10 Pain location: left mid anterior thigh  Aggravating factors: after sitting (especially at work); going down the steps really stiff (feels like it might give out); turning over in bed Relieving factors: walking; butterfly Mixed results on massage gun   PRECAUTIONS: None     WEIGHT BEARING RESTRICTIONS: No  FALLS:  Has patient fallen in last 6 months? No  LIVING ENVIRONMENT: Lives with: lives with their family Lives in: House/apartment Stairs: No   OCCUPATION: mostly sitting [employer], a little walking  PLOF: Independent  PATIENT GOALS:want this goes away; [place] in 2 weeks (flight, walking); walk the track every evening  NEXT MD VISIT: as needed  OBJECTIVE:   DIAGNOSTIC FINDINGS: none  PATIENT SURVEYS:  Eval:  LEFS 35/80  COGNITION: Overall cognitive status: Within functional limits for tasks assessed      MUSCLE LENGTH: Decreased hip flexor/quad length bilaterally (noted in sidelying)  PALPATION: A few tender points left lateral quad mid but does not reproduce the exact pain  LUMBAR SCREEN: some production of symptoms with standing flexion in standing, right sidebend produces left thigh symptoms; prone extension "feels good."  Pain produced on left with slump test and with prone knee bend   LOWER EXTREMITY ROM: WFLs  LOWER EXTREMITY MMT: Offset (less weight on left) but able to rise without UE assist  MMT Right eval Left eval  Hip flexion 5 4  Hip extension 5 4-  Hip abduction 5 4-  Hip adduction    Hip internal rotation    Hip external rotation 5 4-  Knee flexion    Knee extension 5 4  Ankle dorsiflexion    Ankle plantarflexion    Ankle inversion    Ankle eversion     (Blank rows = not tested)    FUNCTIONAL TESTS:  SLS right 25 sec; left 10 sec very wobbly +lateral trunk lean  GAIT:  Comments: +limp, decreased stance time on left   TODAY'S TREATMENT:                                                                                                                                DATE: 09/02/2022 Nustep level 3 x5 min with PT present to discuss status Supine hamstring stretch with strap 2x20 sec bilat Supine IT band stretch with strap 2x20 sec bilat Supine hip adductor stretch with strap 2x20 sec bilat Prone quad stretch with strap 2x20 sec bilat Prone press-up 2x10 Half kneeling on mat stretch with lateral overhead reach/sidebend  2x10 bilat    PATIENT EDUCATION:  Education details: Educated patient on anatomy and physiology of current symptoms, prognosis, plan of care as well as initial self care strategies to promote recovery Person educated: Patient Education method: Explanation Education comprehension: verbalized understanding  HOME EXERCISE PROGRAM: Access Code: [email or web address] URL: [email or web address] Date: 09/02/2022 Prepared by: Clydie Braun Merrin Mcvicker  Exercises - Prone Press Up  - 1 x daily - 7 x weekly - 3 sets - 10 reps - Standing Hip Abduction  - 1 x daily - 7 x weekly - 3 sets - 10 reps - Standing Lumbar Extension  - 1 x daily - 7 x weekly - 3 sets - 10 reps - Half Kneeling Hip Flexor Stretch with Sidebend  - 1 x daily - 7 x weekly - 2 sets - 10 reps - Seated Piriformis Stretch with Trunk Bend  - 1 x daily - 7 x weekly - 2 reps - 20 sec hold - Seated Hamstring Stretch  - 1 x daily - 7 x weekly - 2 reps - 20 sec hold - Seated Cat Cow  - 1 x daily - 7 x weekly - 2 sets - 10 reps  ASSESSMENT:  CLINICAL IMPRESSION: Kathleen Powell presents to skilled PT reporting that she is feeling some better with the extension exercises, but is having difficulty with the QL stretch in the doorway.  Patient with noted increased tightness on left hamstring and IT band and proceeded to performing stretching with strap to assist with tightness.  Patient able to progress with exercises during session with minimal cuing for improved technique and pacing.   Patient reports having decreased pain and feeling better by end of PT session and was provided with updated HEP to allow her to seated stretches and exercises that she can more easily perform at her desk during  the day.   OBJECTIVE IMPAIRMENTS: decreased activity tolerance, difficulty walking, decreased ROM, decreased strength, increased fascial restrictions, impaired perceived functional ability, and pain.   ACTIVITY LIMITATIONS: sitting, standing, squatting, sleeping, stairs, bed mobility, and locomotion level  PARTICIPATION LIMITATIONS: driving, community activity, and occupation  PERSONAL FACTORS: Past/current experiences include some history of LBP are also affecting patient's functional outcome.   REHAB POTENTIAL: Good  CLINICAL DECISION MAKING: Stable/uncomplicated  EVALUATION COMPLEXITY: Low   GOALS: Goals reviewed with patient? Yes  SHORT TERM GOALS: Target date: 09/28/2022   The patient will demonstrate knowledge of basic self care strategies and exercises to promote healing  Baseline: Goal status: ONGOING  2.  The patient will have improved left hip strength to at least 4/5 needed for walking longer distances (for trip to [place])  and descending stairs at work without feeling of give-way Baseline:  Goal status: INITIAL  3.  The patient will have improved left LE needed to squat with minimal/moderate difficulty Baseline:  Goal status: INITIAL  4.  The patient will be able to walk 1/2 mile with pain rating <5/10 Baseline:  Goal status: INITIAL   LONG TERM GOALS: Target date: 10/26/2022   The patient will be independent in a safe self progression of a home exercise program to promote further recovery of function  Baseline:  Goal status: INITIAL  2.  The patient will have improved left hip strength to at least 4+/5 needed for lifting medium objects (luggage) and descending stairs at work with ease Baseline:  Goal status: INITIAL  3.  The patient will be able to  sit for 6-8 hours at work (with rest breaks/stretch breaks throughout the day) with pain 4/10 Baseline:  Goal status: INITIAL  4.  The patient will be able to walk a mile with pain 3/10 Baseline:  Goal status: INITIAL  5.  LEFS improved to 45/80 indicating improved function with less pain Baseline:  Goal status: INITIAL    PLAN:  PT FREQUENCY: 2x/week  PT DURATION: 8 weeks  PLANNED INTERVENTIONS: Therapeutic exercises, Therapeutic activity, Neuromuscular re-education, Balance training, Gait training, Patient/Family education, Self Care, Joint mobilization, Aquatic Therapy, Dry Needling, Electrical stimulation, Spinal mobilization, Cryotherapy, Moist heat, Taping, Traction, Ionotophoresis 4mg /ml Dexamethasone, Manual therapy, and Re-evaluation  PLAN FOR NEXT SESSION: assess and progress HEP as indicated, flexibility, strengthening;  manual therapy soft tissue mobilization/left hip joint mobs (DN not discussed); upcoming trip to [facility], PT, DPT 09/02/22, 8:30 AM  [facility] [address] [place], [place] [address] Phone # [phone] Fax [phone]

## 2022-09-07 ENCOUNTER — Ambulatory Visit: Payer: 59 | Admitting: Rehabilitative and Restorative Service Providers"

## 2022-09-07 ENCOUNTER — Encounter: Payer: Self-pay | Admitting: Rehabilitative and Restorative Service Providers"

## 2022-09-07 DIAGNOSIS — M6281 Muscle weakness (generalized): Secondary | ICD-10-CM

## 2022-09-07 DIAGNOSIS — M79652 Pain in left thigh: Secondary | ICD-10-CM | POA: Diagnosis not present

## 2022-09-07 NOTE — Therapy (Signed)
OUTPATIENT PHYSICAL THERAPY TREATMENT NOTE   Patient Name: Kathleen Powell MRN: [id number] DOB:1985/04/11, 37 y.o., female Today's Date: 09/07/2022  END OF SESSION:  PT End of Session - 09/07/22 0728     Visit Number 3    Date for PT Re-Evaluation 10/26/22    Authorization Type U[employer]    PT Start Time 0727    PT Stop Time 0805    PT Time Calculation (min) 38 min    Activity Tolerance Patient tolerated treatment well    Behavior During Therapy [facility] for tasks assessed/performed             Past Medical History:  Diagnosis Date   Migraine    Migraine    No pertinent past medical history    Tobacco abuse    Vertigo    Past Surgical History:  Procedure Laterality Date   CESAREAN SECTION  11/27/2011   Procedure: CESAREAN SECTION;  Surgeon: Kathreen Cosier, MD;  Location: [facility];  Service: Obstetrics;  Laterality: N/A;  Primary cesarean section with delivery of baby boy at 82. Apgars 8/9.   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  Patient reports having more pain than yesterday.  PERTINENT HISTORY: Had PT for back at [facility] 2 years ago (have a little ache sometimes) PAIN:  PAIN:   Are you having pain? Yes NPRS scale: 3-4/10 Pain location: left mid anterior thigh  Aggravating factors: after sitting (especially at work); going down the steps really stiff (feels like it might give out); turning over in bed Relieving factors: walking; butterfly Mixed results on massage gun   PRECAUTIONS: None     WEIGHT BEARING RESTRICTIONS: No  FALLS:  Has patient fallen in last 6 months? No  LIVING ENVIRONMENT: Lives with: lives with their family Lives in: House/apartment Stairs: No   OCCUPATION: mostly sitting [employer], a little walking  PLOF: Independent  PATIENT GOALS:want this goes away; [place] in 2 weeks (flight, walking); walk the track every evening  NEXT MD VISIT: as needed  OBJECTIVE:   DIAGNOSTIC FINDINGS: none  PATIENT SURVEYS:  Eval:  LEFS 35/80  COGNITION: Overall cognitive status: Within functional limits for tasks assessed      MUSCLE LENGTH: Decreased hip flexor/quad length bilaterally (noted in sidelying)  PALPATION: A few tender points left lateral quad mid but does not reproduce the exact pain  LUMBAR SCREEN: some production of symptoms with standing flexion in standing, right sidebend produces left thigh symptoms; prone extension "feels good."  Pain produced on left with slump test and with prone knee bend   LOWER EXTREMITY ROM: WFLs  LOWER EXTREMITY MMT: Offset (less weight on left) but able to rise without UE assist  MMT Right eval Left eval  Hip flexion 5 4  Hip extension 5 4-  Hip abduction 5 4-  Hip adduction    Hip internal rotation    Hip external rotation 5 4-  Knee flexion    Knee extension 5 4  Ankle dorsiflexion    Ankle plantarflexion    Ankle inversion    Ankle eversion     (Blank rows = not tested)    FUNCTIONAL TESTS:  Eval:  SLS right 25 sec; left 10 sec very wobbly +lateral trunk lean  GAIT:  Comments: +limp, decreased stance time on left   TODAY'S TREATMENT:                                                                                                                                DATE: 09/07/2022 Nustep level 5 x6 min with PT present to discuss status Supine hamstring stretch with strap 2x20 sec bilat Supine IT band stretch with strap 2x20 sec bilat Supine hip adductor stretch with strap 2x20 sec bilat Prone quad stretch with strap 2x20 sec bilat Prone press-up 3x10 Quadruped alt LE/UE extension x10 Child's Pose x20 sec Half kneeling on mat stretch with lateral overhead reach/sidebend  2x10 bilat Sanding forward T with 5# kettlebell and occasional UE support of counter x10 bilat Seated piriformis stretch 2x20 sec bilat   DATE: 09/02/2022 Nustep level 3 x5 min with PT present to discuss status Supine hamstring stretch with strap 2x20 sec bilat Supine IT band stretch with strap 2x20 sec bilat Supine hip adductor stretch with strap 2x20 sec bilat Prone quad stretch with strap 2x20 sec bilat Prone press-up 2x10 Half kneeling on mat stretch with lateral overhead reach/sidebend  2x10 bilat    PATIENT EDUCATION:  Education details: Educated patient on anatomy and physiology of current symptoms, prognosis, plan of care as well as initial self care strategies to promote recovery Person educated: Patient Education method: Explanation Education comprehension: verbalized understanding  HOME EXERCISE PROGRAM: Access Code: [email or web address] URL: [email or web address] Date: 09/02/2022 Prepared by: Clydie Braun Deniro Laymon  Exercises - Prone Press Up  - 1 x daily - 7 x weekly - 3 sets - 10 reps - Standing Hip Abduction  - 1 x daily - 7 x weekly - 3 sets - 10 reps - Standing Lumbar Extension  - 1 x daily - 7 x weekly - 3 sets - 10 reps - Half Kneeling Hip Flexor Stretch with Sidebend  - 1 x daily - 7 x weekly - 2 sets - 10 reps - Seated Piriformis Stretch with Trunk Bend  - 1 x daily - 7 x weekly - 2 reps - 20 sec hold - Seated Hamstring Stretch  - 1 x  daily - 7 x weekly - 2 reps - 20 sec hold - Seated Cat Cow  - 1 x daily - 7 x weekly - 2 sets - 10 reps  ASSESSMENT:  CLINICAL IMPRESSION: [name] presents to skilled PT reporting decreased pain following last  PT session.  She states that when she started having pain yesterday, she did her stretches and that did help.  Patient able to progress with strengthening and core stability during session, requiring minimal cuing for core engagement and posture.  Patient continues to report overall decreased pain at end of session.   OBJECTIVE IMPAIRMENTS: decreased activity tolerance, difficulty walking, decreased ROM, decreased strength, increased fascial restrictions, impaired perceived functional ability, and pain.   ACTIVITY LIMITATIONS: sitting, standing, squatting, sleeping, stairs, bed mobility, and locomotion level  PARTICIPATION LIMITATIONS: driving, community activity, and occupation  PERSONAL FACTORS: Past/current experiences include some history of LBP are also affecting patient's functional outcome.   REHAB POTENTIAL: Good  CLINICAL DECISION MAKING: Stable/uncomplicated  EVALUATION COMPLEXITY: Low   GOALS: Goals reviewed with patient? Yes  SHORT TERM GOALS: Target date: 09/28/2022   The patient will demonstrate knowledge of basic self care strategies and exercises to promote healing  Baseline: Goal status: ONGOING  2.  The patient will have improved left hip strength to at least 4/5 needed for walking longer distances (for trip to [place])  and descending stairs at work without feeling of give-way Baseline:  Goal status: INITIAL  3.  The patient will have improved left LE needed to squat with minimal/moderate difficulty Baseline:  Goal status: INITIAL  4.  The patient will be able to walk 1/2 mile with pain rating <5/10 Baseline:  Goal status: INITIAL   LONG TERM GOALS: Target date: 10/26/2022   The patient will be independent in a safe self progression of a home  exercise program to promote further recovery of function  Baseline:  Goal status: INITIAL  2.  The patient will have improved left hip strength to at least 4+/5 needed for lifting medium objects (luggage) and descending stairs at work with ease Baseline:  Goal status: INITIAL  3.  The patient will be able to sit for 6-8 hours at work (with rest breaks/stretch breaks throughout the day) with pain 4/10 Baseline:  Goal status: INITIAL  4.  The patient will be able to walk a mile with pain 3/10 Baseline:  Goal status: INITIAL  5.  LEFS improved to 45/80 indicating improved function with less pain Baseline:  Goal status: INITIAL    PLAN:  PT FREQUENCY: 2x/week  PT DURATION: 8 weeks  PLANNED INTERVENTIONS: Therapeutic exercises, Therapeutic activity, Neuromuscular re-education, Balance training, Gait training, Patient/Family education, Self Care, Joint mobilization, Aquatic Therapy, Dry Needling, Electrical stimulation, Spinal mobilization, Cryotherapy, Moist heat, Taping, Traction, Ionotophoresis 4mg /ml Dexamethasone, Manual therapy, and Re-evaluation  PLAN FOR NEXT SESSION: assess and progress HEP as indicated, flexibility, strengthening;  manual therapy soft tissue mobilization/left hip joint mobs (DN not discussed); upcoming trip to [facility], PT, DPT 09/07/22, 8:08 AM  [facility] [address] [place], [place] [address] Phone # [phone] Fax [phone]

## 2022-09-09 ENCOUNTER — Ambulatory Visit: Payer: 59 | Admitting: Rehabilitative and Restorative Service Providers"

## 2022-09-09 ENCOUNTER — Encounter: Payer: Self-pay | Admitting: Rehabilitative and Restorative Service Providers"

## 2022-09-09 DIAGNOSIS — M6281 Muscle weakness (generalized): Secondary | ICD-10-CM

## 2022-09-09 DIAGNOSIS — M79652 Pain in left thigh: Secondary | ICD-10-CM | POA: Diagnosis not present

## 2022-09-09 NOTE — Therapy (Signed)
OUTPATIENT PHYSICAL THERAPY TREATMENT NOTE   Patient Name: Kathleen Powell MRN: [id number] DOB:1985/06/10, 37 y.o., female Today's Date: 09/09/2022  END OF SESSION:  PT End of Session - 09/09/22 0737     Visit Number 4    Date for PT Re-Evaluation 10/26/22    Authorization Type UHC    PT Start Time 0729    PT Stop Time 0800    PT Time Calculation (min) 31 min    Activity Tolerance Patient tolerated treatment well    Behavior During Therapy [facility] for tasks assessed/performed             Past Medical History:  Diagnosis Date   Migraine    Migraine    No pertinent past medical history    Tobacco abuse    Vertigo    Past Surgical History:  Procedure Laterality Date   CESAREAN SECTION  11/27/2011   Procedure: CESAREAN SECTION;  Surgeon: Kathreen Cosier, MD;  Location: [facility];  Service: Obstetrics;  Laterality: N/A;  Primary cesarean section with delivery of baby boy at 52. Apgars 8/9.   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  States during the day yesterday, she was having no pain, but last night, had increased pain and could not sleep.  Pt still having increased pain this morning.  PERTINENT HISTORY: Had PT for back at  [facility] 2 years ago (have a little ache sometimes) PAIN:  PAIN:  Are you having pain? Yes NPRS scale: 6/10 Pain location: left mid anterior thigh  Aggravating factors: after sitting (especially at work); going down the steps really stiff (feels like it might give out); turning over in bed Relieving factors: walking; butterfly Mixed results on massage gun   PRECAUTIONS: None     WEIGHT BEARING RESTRICTIONS: No  FALLS:  Has patient fallen in last 6 months? No  LIVING ENVIRONMENT: Lives with: lives with their family Lives in: [place]/apartment Stairs: No   OCCUPATION: mostly sitting [employer], a little walking  PLOF: Independent  PATIENT GOALS:want this goes away; [place] in 2 weeks (flight, walking); walk the track every evening  NEXT MD VISIT: as needed  OBJECTIVE:   DIAGNOSTIC FINDINGS: none  PATIENT SURVEYS:  Eval:  LEFS 35/80  COGNITION: Overall cognitive status: Within functional limits for tasks assessed      MUSCLE LENGTH: Decreased hip flexor/quad length bilaterally (noted in sidelying)  PALPATION: A few tender points left lateral quad mid but does not reproduce the exact pain  LUMBAR SCREEN: some production of symptoms with standing flexion in standing, right sidebend produces left thigh symptoms; prone extension "feels good."  Pain produced on left with slump test and with prone knee bend   LOWER EXTREMITY ROM: WFLs  LOWER EXTREMITY MMT: Offset (less weight on left)  but able to rise without UE assist  MMT Right eval Left eval  Hip flexion 5 4  Hip extension 5 4-  Hip abduction 5 4-  Hip adduction    Hip internal rotation    Hip external rotation 5 4-  Knee flexion    Knee extension 5 4  Ankle dorsiflexion    Ankle plantarflexion    Ankle inversion    Ankle eversion     (Blank rows = not tested)    FUNCTIONAL TESTS:  Eval:  SLS right 25 sec; left 10 sec very wobbly +lateral trunk lean  GAIT:  Comments:  +limp, decreased stance time on left   TODAY'S TREATMENT:                                                                                                                               DATE: 09/09/2022 Nustep level 5 x6 min with PT present to discuss status Seated hamstring stretch with strap 2x20 sec bilat Half kneeling hip flexor stretch with overhead sidebend reach 2x10 bilat Quadruped alt LE/UE extension x10 bilat Child's Pose x20 sec Quadruped fire hydrant x10 bilat Child's Pose x20 sec Quadruped cat/cow x10, followed by 'wag the tail' x10 Manual:  Addaday to left piriformis, hamstring, TFL/IT band   DATE: 09/07/2022 Nustep level 5 x6 min with PT present to discuss status Supine hamstring stretch with strap 2x20 sec bilat Supine IT band stretch with strap 2x20 sec bilat Supine hip adductor stretch with strap 2x20 sec bilat Prone quad stretch with strap 2x20 sec bilat Prone press-up 3x10 Quadruped alt LE/UE extension x10 Child's Pose x20 sec Half kneeling on mat stretch with lateral overhead reach/sidebend  2x10 bilat Sanding forward T with 5# kettlebell and occasional UE support of counter x10 bilat Seated piriformis stretch 2x20 sec bilat   DATE: 09/02/2022 Nustep level 3 x5 min with PT present to discuss status Supine hamstring stretch with strap 2x20 sec bilat Supine IT band stretch with strap 2x20 sec bilat Supine hip adductor stretch with strap 2x20 sec bilat Prone quad stretch with strap 2x20 sec bilat Prone press-up 2x10 Half kneeling on mat stretch with lateral overhead reach/sidebend  2x10 bilat    PATIENT EDUCATION:  Education details: Educated patient on anatomy and physiology of current symptoms, prognosis, plan of care as well as initial self care strategies to promote recovery Person educated: Patient Education method: Explanation Education comprehension: verbalized understanding  HOME EXERCISE PROGRAM: Access Code: [email or web address] URL:  [email or web address] Date: 09/02/2022 Prepared by: Clydie Braun Aprille Sawhney  Exercises - Prone Press Up  - 1 x daily - 7 x weekly - 3 sets - 10 reps - Standing Hip Abduction  - 1 x daily - 7 x weekly - 3 sets - 10 reps - Standing Lumbar Extension  - 1 x daily - 7 x weekly - 3 sets - 10 reps - Half Kneeling Hip Flexor Stretch with Sidebend  - 1 x daily - 7  x weekly - 2 sets - 10 reps - Seated Piriformis Stretch with Trunk Bend  - 1 x daily - 7 x weekly - 2 reps - 20 sec hold - Seated Hamstring Stretch  - 1 x daily - 7 x weekly - 2 reps - 20 sec hold - Seated Cat Cow  - 1 x daily - 7 x weekly - 2 sets - 10 reps  ASSESSMENT:  CLINICAL IMPRESSION: Ridley presents to skilled PT reporting some increased pain last night and this morning and reports that she did not sleep well last night.  Patient reporting feeling stiff this morning, so proceeded with stretching and patient reported feeling better.  Following Addaday manual therapy and stretching, patient reported pain had decreased to 2/10.  Reviewed the importance of continued stretching on vacation and in the airport, pt verbalizes understanding.  Patient to continue HEP while out of town.  Patient to continue to require skilled PT to progress towards goal related activities.   OBJECTIVE IMPAIRMENTS: decreased activity tolerance, difficulty walking, decreased ROM, decreased strength, increased fascial restrictions, impaired perceived functional ability, and pain.   ACTIVITY LIMITATIONS: sitting, standing, squatting, sleeping, stairs, bed mobility, and locomotion level  PARTICIPATION LIMITATIONS: driving, community activity, and occupation  PERSONAL FACTORS: Past/current experiences include some history of LBP are also affecting patient's functional outcome.   REHAB POTENTIAL: Good  CLINICAL DECISION MAKING: Stable/uncomplicated  EVALUATION COMPLEXITY: Low   GOALS: Goals reviewed with patient? Yes  SHORT TERM GOALS: Target date:  09/28/2022   The patient will demonstrate knowledge of basic self care strategies and exercises to promote healing  Baseline: Goal status: ONGOING  2.  The patient will have improved left hip strength to at least 4/5 needed for walking longer distances (for trip to [place])  and descending stairs at work without feeling of give-way Baseline:  Goal status: ONGOING  3.  The patient will have improved left LE needed to squat with minimal/moderate difficulty Baseline:  Goal status: ONGOING  4.  The patient will be able to walk 1/2 mile with pain rating <5/10 Baseline:  Goal status: ONGOING   LONG TERM GOALS: Target date: 10/26/2022   The patient will be independent in a safe self progression of a home exercise program to promote further recovery of function  Baseline:  Goal status: INITIAL  2.  The patient will have improved left hip strength to at least 4+/5 needed for lifting medium objects (luggage) and descending stairs at work with ease Baseline:  Goal status: INITIAL  3.  The patient will be able to sit for 6-8 hours at work (with rest breaks/stretch breaks throughout the day) with pain 4/10 Baseline:  Goal status: INITIAL  4.  The patient will be able to walk a mile with pain 3/10 Baseline:  Goal status: INITIAL  5.  LEFS improved to 45/80 indicating improved function with less pain Baseline:  Goal status: INITIAL    PLAN:  PT FREQUENCY: 2x/week  PT DURATION: 8 weeks  PLANNED INTERVENTIONS: Therapeutic exercises, Therapeutic activity, Neuromuscular re-education, Balance training, Gait training, Patient/Family education, Self Care, Joint mobilization, Aquatic Therapy, Dry Needling, Electrical stimulation, Spinal mobilization, Cryotherapy, Moist heat, Taping, Traction, Ionotophoresis 4mg /ml Dexamethasone, Manual therapy, and Re-evaluation  PLAN FOR NEXT SESSION: assess and progress HEP as indicated, flexibility, strengthening;  manual therapy soft tissue  mobilization/left hip joint mobs (DN not discussed); ask about pain following trip to [facility] [place], PT, DPT 09/09/22, 8:49 AM  [facility] [phone]  [address], [address] [place], [place] [address] Phone # [phone] Fax [phone]

## 2022-09-20 NOTE — Therapy (Signed)
OUTPATIENT PHYSICAL THERAPY TREATMENT NOTE   Patient Name: Kathleen [place] MRN: [id number] DOB:07-09-1985, 37 y.o., female Today's Date: 09/21/2022  END OF SESSION:  PT End of Session - 09/21/22 1231     Visit Number 5    Authorization Type UHC    PT Start Time 1231    PT Stop Time 1311    PT Time Calculation (min) 40 min    Activity Tolerance Patient tolerated treatment well    Behavior During Therapy WFL for tasks assessed/performed              Past Medical History:  Diagnosis Date   Migraine    Migraine    No pertinent past medical history    Tobacco abuse    Vertigo    Past Surgical History:  Procedure Laterality Date   CESAREAN SECTION  11/27/2011   Procedure: CESAREAN SECTION;  Surgeon: Kathreen Cosier, MD;  Location: [facility];  Service: Obstetrics;  Laterality: N/A;  Primary cesarean section with delivery of baby boy at 50. Apgars 8/9.   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 Pain mainly when sitting so on flights. Walking around was fine.   PERTINENT HISTORY: Had PT for back at [facility] 2 years ago (have a little ache sometimes) PAIN:  PAIN:  Are you having pain? No NPRS scale: 0/10 Pain location: left mid anterior thigh  Aggravating factors: after sitting  (especially at work); going down the steps really stiff (feels like it might give out); turning over in bed Relieving factors: walking; butterfly Mixed results on massage gun   PRECAUTIONS: None     WEIGHT BEARING RESTRICTIONS: No  FALLS:  Has patient fallen in last 6 months? No  LIVING ENVIRONMENT: Lives with: lives with their family Lives in: House/apartment Stairs: No   OCCUPATION: mostly sitting [employer], a little walking  PLOF: Independent  PATIENT GOALS:want this goes away; [place] in 2 weeks (flight, walking); walk the track every evening  NEXT MD VISIT: as needed  OBJECTIVE:   DIAGNOSTIC FINDINGS: none  PATIENT SURVEYS:  Eval:  LEFS 35/80  COGNITION: Overall cognitive status: Within functional limits for tasks assessed      MUSCLE LENGTH: Decreased hip flexor/quad length bilaterally (noted in sidelying)  PALPATION: A few tender points left lateral quad mid but does not reproduce the exact pain  LUMBAR SCREEN: some production of symptoms with standing flexion in standing, right sidebend produces left thigh symptoms; prone extension "feels good."  Pain produced on left with slump test and with prone knee bend   LOWER EXTREMITY ROM: WFLs  LOWER EXTREMITY MMT: Offset (less weight on left) but able to rise without UE assist  MMT Right eval Left eval Left 09/21/22  Hip flexion 5 4 5   Hip extension 5 4- 5  Hip abduction 5 4- 5  Hip  adduction   4  Hip internal rotation     Hip external rotation 5 4- 4+  Knee flexion   4+  Knee extension 5 4 5   Ankle dorsiflexion   4+  Ankle plantarflexion   5  Ankle inversion     Ankle eversion      (Blank rows = not tested)    FUNCTIONAL TESTS:  Eval:  SLS right 25 sec; left 10 sec very wobbly +lateral trunk lean  GAIT:  Comments: +limp, decreased stance time on left   TODAY'S TREATMENT:                                                                                                                                 DATE: 09/21/2022 Nustep level 5 x6 min with PT present to discuss status Seated hamstring stretch with strap 2x30 sec bilat Half kneeling hip flexor stretch with overhead sidebend reach 2x10 bilat Quadruped alt LE/UE extension x10 bilat 5 sec hold Beast position 3-5 sec hold x 10 Plank 5 x max hold Side plank 5 x max hold B Quadruped fire hydrant x10 bilat, then with yellow loop x 10 ea Stairs: eccentric heel taps L x 10 (no problem) Sidelying quad stretch L x 20 sec, prone quad stretch with strap x 1 min Manual: UPA mobs to assess lumbar mobility - some stiffness in left lumbar and mild pain at L5/S1.  DATE: 09/09/2022 Nustep level 5 x6 min with PT present to discuss status Seated hamstring stretch with strap 2x20 sec bilat Half kneeling hip flexor stretch with overhead sidebend reach 2x10 bilat Quadruped alt LE/UE extension x10 bilat Child's Pose x20 sec Quadruped fire hydrant x10 bilat Child's Pose x20 sec Quadruped cat/cow x10, followed by 'wag the tail' x10 Manual:  Addaday to left piriformis, hamstring, TFL/IT band   DATE: 09/07/2022 Nustep level 5 x6 min with PT present to discuss status Supine hamstring stretch with strap 2x20 sec bilat Supine IT band stretch with strap 2x20 sec bilat Supine hip adductor stretch with strap 2x20 sec bilat Prone quad stretch with strap 2x20 sec bilat Prone press-up 3x10 Quadruped alt LE/UE extension x10 Child's Pose x20 sec Half kneeling on mat stretch with lateral overhead reach/sidebend  2x10 bilat Sanding forward T with 5# kettlebell and occasional UE support of counter x10 bilat Seated piriformis stretch 2x20 sec bilat   PATIENT EDUCATION:  Education details: Educated patient on anatomy and physiology of current symptoms, prognosis, plan of care as well as initial self care strategies to promote recovery Person educated: Patient Education method: Explanation Education comprehension: verbalized  understanding  HOME EXERCISE PROGRAM: Access Code: [email or web address] URL: [email or web address] Date: 09/21/2022 Prepared by: Raynelle Fanning  Exercises - Prone Press Up  - 1 x daily - 7 x weekly - 3 sets - 10 reps - Standing Hip Abduction  - 1 x daily - 7 x weekly - 3 sets - 10 reps - Standing Lumbar Extension  -  1 x daily - 7 x weekly - 3 sets - 10 reps - Half Kneeling Hip Flexor Stretch with Sidebend  - 1 x daily - 7 x weekly - 2 sets - 10 reps - Seated Piriformis Stretch with Trunk Bend  - 1 x daily - 7 x weekly - 2 reps - 20 sec hold - Seated Hamstring Stretch  - 1 x daily - 7 x weekly - 2 reps - 20 sec hold - Seated Cat Cow  - 1 x daily - 7 x weekly - 2 sets - 10 reps - Primal Push Up  - 1 x daily - 3-4 x weekly - 1 sets - 10 reps - max  hold - Standard Plank  - 1 x daily - 3-4 x weekly - 1 sets - 5 reps - max hold - Side Plank on Elbow  - 1 x daily - 3-4 x weekly - 1 sets - 5 reps - max hold - Quadruped Hip Abduction with Resistance Loop  - 1 x daily - 3-4 x weekly - 2 sets - 10 reps - Standing Quadriceps Stretch  - 1 x daily - 3-4 x weekly - 1 sets - 3 reps - 30 sec hold  ASSESSMENT:  CLINICAL IMPRESSION: Kathleen [place] is progressing well toward her goals. She is able to walk without pain and her strength has improved considerably in the L LE. She still feels intermittent pain in the left thigh primarily after sitting. She was able to descend stairs at work yesterday with no pain. We progressed core strengthening today. She has good form with higher level core activities but they are challenging. Kathleen Powell continues to demonstrate potential for improvement and would benefit from continued skilled therapy to address impairments.     OBJECTIVE IMPAIRMENTS: decreased activity tolerance, difficulty walking, decreased ROM, decreased strength, increased fascial restrictions, impaired perceived functional ability, and pain.   ACTIVITY LIMITATIONS: sitting, standing, squatting, sleeping,  stairs, bed mobility, and locomotion level  PARTICIPATION LIMITATIONS: driving, community activity, and occupation  PERSONAL FACTORS: Past/current experiences include some history of LBP are also affecting patient's functional outcome.   REHAB POTENTIAL: Good  CLINICAL DECISION MAKING: Stable/uncomplicated  EVALUATION COMPLEXITY: Low   GOALS: Goals reviewed with patient? Yes  SHORT TERM GOALS: Target date: 09/28/2022   The patient will demonstrate knowledge of basic self care strategies and exercises to promote healing  Baseline: Goal status: MET  2.  The patient will have improved left hip strength to at least 4/5 needed for walking longer distances (for trip to [place])  and descending stairs at work without feeling of give-way Baseline:  Goal status: PARTIALLY MET  3.  The patient will have improved left LE needed to squat with minimal/moderate difficulty Baseline:  Goal status: ONGOING  4.  The patient will be able to walk 1/2 mile with pain rating <5/10 Baseline:  Goal status: MET   LONG TERM GOALS: Target date: 10/26/2022   The patient will be independent in a safe self progression of a home exercise program to promote further recovery of function  Baseline:  Goal status: INITIAL  2.  The patient will have improved left hip strength to at least 4+/5 needed for lifting medium objects (luggage) and descending stairs at work with ease Baseline:  Goal status: MET  3.  The patient will be able to sit for 6-8 hours at work (with rest breaks/stretch breaks throughout the day) with pain 4/10 Baseline:  Goal status: INITIAL  4.  The patient  will be able to walk a mile with pain 3/10 Baseline:  Goal status: INITIAL  5.  LEFS improved to 45/80 indicating improved function with less pain Baseline:  Goal status: INITIAL    PLAN:  PT FREQUENCY: 2x/week  PT DURATION: 8 weeks  PLANNED INTERVENTIONS: Therapeutic exercises, Therapeutic activity, Neuromuscular  re-education, Balance training, Gait training, Patient/Family education, Self Care, Joint mobilization, Aquatic Therapy, Dry Needling, Electrical stimulation, Spinal mobilization, Cryotherapy, Moist heat, Taping, Traction, Ionotophoresis 4mg /ml Dexamethasone, Manual therapy, and Re-evaluation  PLAN FOR NEXT SESSION: assess and progress HEP as indicated, flexibility, strengthening;  manual therapy soft tissue mobilization/left hip joint mobs (DN not discussed); ask about pain following trip to [facility] Coden Franchi, PT  09/21/22, 1:27 PM  [facility] [address] [place], [place] [address] Phone # [phone] Fax [phone]

## 2022-09-21 ENCOUNTER — Ambulatory Visit: Payer: 59 | Attending: Family Medicine | Admitting: Physical Therapy

## 2022-09-21 ENCOUNTER — Encounter: Payer: Self-pay | Admitting: Physical Therapy

## 2022-09-21 DIAGNOSIS — M6281 Muscle weakness (generalized): Secondary | ICD-10-CM | POA: Insufficient documentation

## 2022-09-21 DIAGNOSIS — M79652 Pain in left thigh: Secondary | ICD-10-CM | POA: Diagnosis not present

## 2022-09-24 ENCOUNTER — Encounter: Payer: 59 | Admitting: Rehabilitative and Restorative Service Providers"

## 2022-09-28 ENCOUNTER — Ambulatory Visit: Payer: 59

## 2022-09-30 ENCOUNTER — Encounter: Payer: Self-pay | Admitting: Rehabilitative and Restorative Service Providers"

## 2022-09-30 ENCOUNTER — Ambulatory Visit: Payer: 59 | Admitting: Rehabilitative and Restorative Service Providers"

## 2022-09-30 DIAGNOSIS — M79652 Pain in left thigh: Secondary | ICD-10-CM | POA: Diagnosis not present

## 2022-09-30 DIAGNOSIS — M6281 Muscle weakness (generalized): Secondary | ICD-10-CM

## 2022-09-30 NOTE — Patient Instructions (Signed)

## 2022-09-30 NOTE — Therapy (Addendum)
OUTPATIENT PHYSICAL THERAPY TREATMENT NOTE AND LATE ENTRY DISCHARGE SUMMARY   Patient Name: Kathleen Powell MRN: [id number] DOB:1985-09-19, 37 y.o., female Today's Date: 09/30/2022  END OF SESSION:  PT End of Session - 09/30/22 0803     Visit Number 6    Date for PT Re-Evaluation 10/26/22    Authorization Type [employer]    PT Start Time 0800    PT Stop Time 0840    PT Time Calculation (min) 40 min    Activity Tolerance Patient tolerated treatment well    Behavior During Therapy WFL for tasks assessed/performed              Past Medical History:  Diagnosis Date   Migraine    Migraine    No pertinent past medical history    Tobacco abuse    Vertigo    Past Surgical History:  Procedure Laterality Date   CESAREAN SECTION  11/27/2011   Procedure: CESAREAN SECTION;  Surgeon: Kathreen Cosier, MD;  Location: [facility];  Service: Obstetrics;  Laterality: N/A;  Primary cesarean section with delivery of baby boy at 75. Apgars 8/9.   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  Stating that she believes it could be partially due to sitting for prolonged periods at work.  PERTINENT  HISTORY: Had PT for back at [facility] 2 years ago (have a little ache sometimes)  PAIN:  PAIN:  Are you having pain? Yes NPRS scale: 5/10 Pain location: left mid anterior thigh  Aggravating factors: after sitting (especially at work); going down the steps really stiff (feels like it might give out); turning over in bed Relieving factors: walking; butterfly Mixed results on massage gun   PRECAUTIONS: None     WEIGHT BEARING RESTRICTIONS: No  FALLS:  Has patient fallen in last 6 months? No  LIVING ENVIRONMENT: Lives with: lives with their family Lives in: House/apartment Stairs: No   OCCUPATION: mostly sitting [employer], a little walking  PLOF: Independent  PATIENT GOALS:want this goes away; [place] in 2 weeks (flight, walking); walk the track every evening  NEXT MD VISIT: as needed  OBJECTIVE:   DIAGNOSTIC FINDINGS: none  PATIENT SURVEYS:  Eval:  LEFS 35/80  COGNITION: Overall cognitive status: Within functional limits for tasks assessed      MUSCLE LENGTH: Decreased hip flexor/quad length bilaterally (noted in sidelying)  PALPATION: A few tender points left lateral quad mid but does not reproduce the exact pain  LUMBAR SCREEN: some production of symptoms with standing flexion in standing, right sidebend produces left thigh symptoms; prone extension "feels good."  Pain produced on left with slump test and with prone knee bend   LOWER EXTREMITY ROM: WFLs  LOWER EXTREMITY MMT: Offset (  less weight on left) but able to rise without UE assist  MMT Right eval Left eval Left 09/21/22  Hip flexion 5 4 5   Hip extension 5 4- 5  Hip abduction 5 4- 5  Hip adduction   4  Hip internal rotation     Hip external rotation 5 4- 4+  Knee flexion   4+  Knee extension 5 4 5   Ankle dorsiflexion   4+  Ankle plantarflexion   5  Ankle inversion     Ankle eversion      (Blank rows = not tested)    FUNCTIONAL TESTS:  Eval:  SLS right 25 sec;  left 10 sec very wobbly +lateral trunk lean  GAIT:  Comments: +limp, decreased stance time on left   TODAY'S TREATMENT:                                                                                                                               DATE: 09/30/2022 Nustep level 5 x6 min with PT present to discuss status Supine hamstring stretch with strap 2x20 sec with LLE Supine IT band stretch with strap 2x20 sec with LLE Supine bridge with hip abduction with blue loop 2x10 Quadruped fire hydrant 2x10 bilat with cuing for improved posture/body mechanics Bear plank 5x10 sec Child's pose with wide base of support 2x20 sec Squat tap to 20" mat with 10# kettle bell 2x10 Trigger Point Dry-Needling  Treatment instructions: Expect mild to moderate muscle soreness. S/S of pneumothorax if dry needled over a lung field, and to seek immediate medical attention should they occur. Patient verbalized understanding of these instructions and education. Patient Consent Given: Yes Education handout provided: Yes Muscles treated: left lumbar multifidi, left glute/piriformis Electrical stimulation performed: No Parameters: N/A Treatment response/outcome: Utilized skilled palpation to identify bony landmarks and trigger points.  Able to illicit twitch response and muscle elongation.  Soft tissue mobilization with cocoa following to further promote tissue elongation.      DATE: 09/21/2022 Nustep level 5 x6 min with PT present to discuss status Seated hamstring stretch with strap 2x30 sec bilat Half kneeling hip flexor stretch with overhead sidebend reach 2x10 bilat Quadruped alt LE/UE extension x10 bilat 5 sec hold Beast position 3-5 sec hold x 10 Plank 5 x max hold Side plank 5 x max hold B Quadruped fire hydrant x10 bilat, then with yellow loop x 10 ea Stairs: eccentric heel taps L x 10 (no problem) Sidelying quad stretch L x 20 sec, prone quad stretch with strap x 1 min Manual: UPA mobs to assess  lumbar mobility - some stiffness in left lumbar and mild pain at L5/S1.  DATE: 09/09/2022 Nustep level 5 x6 min with PT present to discuss status Seated hamstring stretch with strap 2x20 sec bilat Half kneeling hip flexor stretch with overhead sidebend reach 2x10 bilat Quadruped alt LE/UE extension x10 bilat Child's Pose x20 sec Quadruped fire hydrant x10 bilat Child's Pose x20  sec Quadruped cat/cow x10, followed by 'wag the tail' x10 Manual:  Addaday to left piriformis, hamstring, TFL/IT band    PATIENT EDUCATION:  Education details: Educated patient on anatomy and physiology of current symptoms, prognosis, plan of care as well as initial self care strategies to promote recovery Person educated: Patient Education method: Explanation Education comprehension: verbalized understanding  HOME EXERCISE PROGRAM: Access Code: [email or web address] URL: [email or web address] Date: 09/21/2022 Prepared by: Raynelle Fanning  Exercises - Prone Press Up  - 1 x daily - 7 x weekly - 3 sets - 10 reps - Standing Hip Abduction  - 1 x daily - 7 x weekly - 3 sets - 10 reps - Standing Lumbar Extension  - 1 x daily - 7 x weekly - 3 sets - 10 reps - Half Kneeling Hip Flexor Stretch with Sidebend  - 1 x daily - 7 x weekly - 2 sets - 10 reps - Seated Piriformis Stretch with Trunk Bend  - 1 x daily - 7 x weekly - 2 reps - 20 sec hold - Seated Hamstring Stretch  - 1 x daily - 7 x weekly - 2 reps - 20 sec hold - Seated Cat Cow  - 1 x daily - 7 x weekly - 2 sets - 10 reps - Primal Push Up  - 1 x daily - 3-4 x weekly - 1 sets - 10 reps - max  hold - Standard Plank  - 1 x daily - 3-4 x weekly - 1 sets - 5 reps - max hold - Side Plank on Elbow  - 1 x daily - 3-4 x weekly - 1 sets - 5 reps - max hold - Quadruped Hip Abduction with Resistance Loop  - 1 x daily - 3-4 x weekly - 2 sets - 10 reps - Standing Quadriceps Stretch  - 1 x daily - 3-4 x weekly - 1 sets - 3 reps - 30 sec hold  ASSESSMENT:  CLINICAL  IMPRESSION: Ms Gebert presents to skilled PT reporting increased pain today.  Patient was educated about dry needling and she states that she has heard about this from friends before and is interested in trying.  Patient with good response to dry needling with good twitch response noted.  Patient did well with supine bridge with hip abduction and reported that she could feel her weak muscles targeted.  Patient reports pain of 0/10 at end of session and provided with dry needling instruction handout.    OBJECTIVE IMPAIRMENTS: decreased activity tolerance, difficulty walking, decreased ROM, decreased strength, increased fascial restrictions, impaired perceived functional ability, and pain.   ACTIVITY LIMITATIONS: sitting, standing, squatting, sleeping, stairs, bed mobility, and locomotion level  PARTICIPATION LIMITATIONS: driving, community activity, and occupation  PERSONAL FACTORS: Past/current experiences include some history of LBP are also affecting patient's functional outcome.   REHAB POTENTIAL: Good  CLINICAL DECISION MAKING: Stable/uncomplicated  EVALUATION COMPLEXITY: Low   GOALS: Goals reviewed with patient? Yes  SHORT TERM GOALS: Target date: 09/28/2022   The patient will demonstrate knowledge of basic self care strategies and exercises to promote healing  Baseline: Goal status: MET  2.  The patient will have improved left hip strength to at least 4/5 needed for walking longer distances (for trip to [place])  and descending stairs at work without feeling of give-way Baseline:  Goal status: MET on 09/21/22  3.  The patient will have improved left LE needed to squat with minimal/moderate difficulty Baseline:  Goal status: MET  4.  The patient will  be able to walk 1/2 mile with pain rating <5/10 Baseline:  Goal status: MET   LONG TERM GOALS: Target date: 10/26/2022   The patient will be independent in a safe self progression of a home exercise program to promote further  recovery of function  Baseline:  Goal status: Ongoing  2.  The patient will have improved left hip strength to at least 4+/5 needed for lifting medium objects (luggage) and descending stairs at work with ease Baseline:  Goal status: MET  3.  The patient will be able to sit for 6-8 hours at work (with rest breaks/stretch breaks throughout the day) with pain 4/10 Baseline:  Goal status: Ongoing  4.  The patient will be able to walk a mile with pain 3/10 Baseline:  Goal status: INITIAL  5.  LEFS improved to 45/80 indicating improved function with less pain Baseline:  Goal status: INITIAL    PLAN:  PT FREQUENCY: 2x/week  PT DURATION: 8 weeks  PLANNED INTERVENTIONS: Therapeutic exercises, Therapeutic activity, Neuromuscular re-education, Balance training, Gait training, Patient/Family education, Self Care, Joint mobilization, Aquatic Therapy, Dry Needling, Electrical stimulation, Spinal mobilization, Cryotherapy, Moist heat, Taping, Traction, Ionotophoresis 4mg /ml Dexamethasone, Manual therapy, and Re-evaluation  PLAN FOR NEXT SESSION: Assess and progress HEP as indicated, strengthening, flexibility, manual/dry needling as indicated    Reather Laurence, PT, DPT 09/30/22, 8:46 AM  [facility] [address] [place], [place] [address] Phone # [phone] Fax [phone]     PHYSICAL THERAPY DISCHARGE SUMMARY  Received a message on Epic on 10/14/2022 that patient no longer wanted further PT services.  Patient agrees to discharge. Patient goals were partially met. Patient is being discharged due to the patient's request.  Reather Laurence, PT, DPT 10/14/22, 7:41 AM

## 2022-10-05 ENCOUNTER — Ambulatory Visit: Payer: 59 | Admitting: Rehabilitative and Restorative Service Providers"

## 2022-10-07 ENCOUNTER — Ambulatory Visit: Payer: 59 | Admitting: Rehabilitative and Restorative Service Providers"

## 2022-10-12 ENCOUNTER — Ambulatory Visit: Payer: 59 | Admitting: Rehabilitative and Restorative Service Providers"

## 2022-10-12 ENCOUNTER — Ambulatory Visit (INDEPENDENT_AMBULATORY_CARE_PROVIDER_SITE_OTHER): Payer: 59 | Admitting: Internal Medicine

## 2022-10-12 ENCOUNTER — Encounter: Payer: Self-pay | Admitting: Internal Medicine

## 2022-10-12 VITALS — BP 120/84 | HR 95 | Temp 98.3°F | Ht 63.0 in | Wt 164.0 lb

## 2022-10-12 DIAGNOSIS — Z0000 Encounter for general adult medical examination without abnormal findings: Secondary | ICD-10-CM

## 2022-10-12 DIAGNOSIS — E559 Vitamin D deficiency, unspecified: Secondary | ICD-10-CM

## 2022-10-12 DIAGNOSIS — Z720 Tobacco use: Secondary | ICD-10-CM

## 2022-10-12 DIAGNOSIS — Z23 Encounter for immunization: Secondary | ICD-10-CM

## 2022-10-12 DIAGNOSIS — E538 Deficiency of other specified B group vitamins: Secondary | ICD-10-CM

## 2022-10-12 LAB — COMPREHENSIVE METABOLIC PANEL
ALT: 7 U/L (ref 0–35)
AST: 12 U/L (ref 0–37)
Albumin: 3.7 g/dL (ref 3.5–5.2)
Alkaline Phosphatase: 62 U/L (ref 39–117)
BUN: 10 mg/dL (ref 6–23)
CO2: 25 mEq/L (ref 19–32)
Calcium: 9 mg/dL (ref 8.4–10.5)
Chloride: 105 mEq/L (ref 96–112)
Creatinine, Ser: 0.79 mg/dL (ref 0.40–1.20)
GFR: 94.14 mL/min (ref >60.00)
Glucose, Bld: 78 mg/dL (ref 70–99)
Potassium: 4 mEq/L (ref 3.5–5.1)
Sodium: 138 mEq/L (ref 135–145)
Total Bilirubin: 0.3 mg/dL (ref 0.2–1.2)
Total Protein: 7.2 g/dL (ref 6.0–8.3)

## 2022-10-12 LAB — CBC WITH DIFFERENTIAL/PLATELET
Basophils Absolute: 0 10*3/uL (ref 0.0–0.1)
Basophils Relative: 0.3 % (ref 0.0–3.0)
Eosinophils Absolute: 0.1 10*3/uL (ref 0.0–0.7)
Eosinophils Relative: 1.3 % (ref 0.0–5.0)
HCT: 37 % (ref 36.0–46.0)
Hemoglobin: 12.2 g/dL (ref 12.0–15.0)
Lymphocytes Relative: 35.8 % (ref 12.0–46.0)
Lymphs Abs: 2.6 10*3/uL (ref 0.7–4.0)
MCHC: 32.9 g/dL (ref 30.0–36.0)
MCV: 92.3 fl (ref 78.0–100.0)
Monocytes Absolute: 0.9 10*3/uL (ref 0.1–1.0)
Monocytes Relative: 12.6 % — ABNORMAL HIGH (ref 3.0–12.0)
Neutro Abs: 3.6 10*3/uL (ref 1.4–7.7)
Neutrophils Relative %: 50 % (ref 43.0–77.0)
Platelets: 434 10*3/uL — ABNORMAL HIGH (ref 150.0–400.0)
RBC: 4.01 Mil/uL (ref 3.87–5.11)
RDW: 13.2 % (ref 11.5–15.5)
WBC: 7.2 10*3/uL (ref 4.0–10.5)

## 2022-10-12 LAB — VITAMIN B12: Vitamin B-12: 203 pg/mL — ABNORMAL LOW (ref 211–911)

## 2022-10-12 LAB — LIPID PANEL
Cholesterol: 128 mg/dL (ref 0–200)
HDL: 48 mg/dL (ref >39.00)
LDL Cholesterol: 66 mg/dL (ref 0–99)
NonHDL: 79.92
Total CHOL/HDL Ratio: 3
Triglycerides: 71 mg/dL (ref 0.0–149.0)
VLDL: 14.2 mg/dL (ref 0.0–40.0)

## 2022-10-12 LAB — TSH: TSH: 1.08 u[IU]/mL (ref 0.35–5.50)

## 2022-10-12 LAB — VITAMIN D 25 HYDROXY (VIT D DEFICIENCY, FRACTURES): VITD: 17.2 ng/mL — ABNORMAL LOW (ref 30.00–100.00)

## 2022-10-12 NOTE — Addendum Note (Signed)
Addended by: Kern Reap B on: 10/12/2022 02:53 PM   Modules accepted: Orders

## 2022-10-12 NOTE — Progress Notes (Signed)
Established Patient Office Visit     CC/Reason for Visit: Annual preventive exam  HPI: Kathleen Powell is a 37 y.o. female who is coming in today for the above mentioned reasons. Past Medical History is significant for: Ongoing tobacco use, not interested in discussing cessation today.  Feeling well without major concerns or complaints.  She is due for Tdap, flu, COVID vaccines.  She is due for eye and dental exams and will schedule on her own.  She had her cervical cancer screening earlier this year.   Past Medical/Surgical History: Past Medical History:  Diagnosis Date   Migraine    Migraine    No pertinent past medical history    Tobacco abuse    Vertigo     Past Surgical History:  Procedure Laterality Date   CESAREAN SECTION  11/27/2011   Procedure: CESAREAN SECTION;  Surgeon: Kathreen Cosier, MD;  Location: [facility];  Service: Obstetrics;  Laterality: N/A;  Primary cesarean section with delivery of baby boy at 65. Apgars 8/9.    Social History:  reports that she has been smoking cigarettes. She started smoking about 7 months ago. She has a 0.3 pack-year smoking history. She has quit using smokeless tobacco. She reports current alcohol use. She reports that she does not use drugs.  Allergies: No Known Allergies  Family History:  Family History  Problem Relation Age of Onset   Hypertension Mother    Cancer Mother        thyroid   Cataracts Mother    Heart disease Father        pace maker   Kidney failure Father    Asthma Sister    Heart disease Maternal Grandmother        congestive heart failure   Hypertension Maternal Grandmother    Heart attack Maternal Grandmother    Gout Maternal Grandfather    Colon cancer Paternal Grandfather    Colon cancer Paternal Uncle    Anesthesia problems Neg Hx      Current Outpatient Medications:    ibuprofen (ADVIL) 800 MG tablet, Take 1 tablet (800 mg total) by mouth every 8 (eight) hours as needed., Disp: 30  tablet, Rfl: 5   Norethindrone Acetate-Ethinyl Estrad-FE (LOESTRIN 24 FE) 1-20 MG-MCG(24) tablet, Take 1 tablet by mouth daily., Disp: 28 tablet, Rfl: 11   HYDROcodone-acetaminophen (NORCO) 5-325 MG tablet, Take 1-2 tablets by mouth every 6 (six) hours as needed. (Patient not taking: Reported on 10/12/2022), Disp: 12 tablet, Rfl: 0  Review of Systems:  Negative unless indicated in HPI.   Physical Exam: Vitals:   10/12/22 1421  BP: 120/84  Pulse: 95  Temp: 98.3 F (36.8 C)  TempSrc: Oral  SpO2: 100%  Weight: 164 lb (74.4 kg)  Height: 5\' 3"  (1.6 m)    Body mass index is 29.05 kg/m.   Physical Exam Vitals reviewed.  Constitutional:      General: She is not in acute distress.    Appearance: Normal appearance. She is not ill-appearing, toxic-appearing or diaphoretic.  HENT:     Head: Normocephalic.     Right Ear: Tympanic membrane, ear canal and external ear normal. There is no impacted cerumen.     Left Ear: Tympanic membrane, ear canal and external ear normal. There is no impacted cerumen.     Nose: Nose normal.     Mouth/Throat:     Mouth: Mucous membranes are moist.     Pharynx: Oropharynx is clear. No oropharyngeal exudate  or posterior oropharyngeal erythema.  Eyes:     General: No scleral icterus.       Right eye: No discharge.        Left eye: No discharge.     Conjunctiva/sclera: Conjunctivae normal.     Pupils: Pupils are equal, round, and reactive to light.  Neck:     Vascular: No carotid bruit.  Cardiovascular:     Rate and Rhythm: Normal rate and regular rhythm.     Pulses: Normal pulses.     Heart sounds: Normal heart sounds.  Pulmonary:     Effort: Pulmonary effort is normal. No respiratory distress.     Breath sounds: Normal breath sounds.  Abdominal:     General: Abdomen is flat. Bowel sounds are normal.     Palpations: Abdomen is soft.  Musculoskeletal:        General: Normal range of motion.     Cervical back: Normal range of motion.  Skin:     General: Skin is warm and dry.  Neurological:     General: No focal deficit present.     Mental Status: She is alert and oriented to person, place, and time. Mental status is at baseline.  Psychiatric:        Mood and Affect: Mood normal.        Behavior: Behavior normal.        Thought Content: Thought content normal.        Judgment: Judgment normal.     Flowsheet Row Office Visit from 08/31/2022 in [facility]  PHQ-9 Total Score 0       Impression and Plan:  Encounter for preventive health examination -     CBC with Differential/Platelet; Future -     Comprehensive metabolic panel; Future -     Lipid panel; Future -     TSH; Future  Vitamin D deficiency -     VITAMIN D 25 Hydroxy (Vit-D Deficiency, Fractures); Future  Vitamin B12 deficiency -     Vitamin B12; Future  Tobacco abuse  Immunization due   -Recommend routine eye and dental care. -Healthy lifestyle discussed in detail. -Labs to be updated today. -Prostate cancer screening: N/A Health Maintenance  Topic Date Due   COVID-19 Vaccine (3 - 2023-24 season) 10/16/2021   DTaP/Tdap/Td vaccine (2 - Td or Tdap) 11/28/2021   Flu Shot  09/16/2022   Pap Smear  05/17/2025   Hepatitis C Screening  Completed   HIV Screening  Completed   HPV Vaccine  Aged Out     -Flu and Tdap vaccines administered in office today. -Advised to update COVID-vaccine at pharmacy.    Chaya Jan, MD [facility] at [facility]

## 2022-10-12 NOTE — Patient Instructions (Signed)
-  Nice seeing you today!!  -Lab work today; will notify you once results are available.  -Flu and Tdap vaccines in office today. Consider updating your COVID vaccine at the pharmacy.  -See you back in 1 year or sooner as needed.

## 2022-10-14 ENCOUNTER — Other Ambulatory Visit: Payer: Self-pay | Admitting: Internal Medicine

## 2022-10-14 ENCOUNTER — Ambulatory Visit: Payer: 59 | Admitting: Rehabilitative and Restorative Service Providers"

## 2022-10-14 ENCOUNTER — Encounter: Payer: Self-pay | Admitting: Internal Medicine

## 2022-10-14 DIAGNOSIS — E559 Vitamin D deficiency, unspecified: Secondary | ICD-10-CM

## 2022-10-14 MED ORDER — VITAMIN D (ERGOCALCIFEROL) 1.25 MG (50000 UNIT) PO CAPS
50000.0000 [IU] | ORAL_CAPSULE | ORAL | 0 refills | Status: AC
Start: 2022-10-14 — End: 2022-12-31

## 2022-10-14 MED ORDER — "BD SAFETYGLIDE SYRINGE/NEEDLE 25G X 1"" 3 ML MISC": 3 refills | Status: DC

## 2022-10-14 MED ORDER — CYANOCOBALAMIN 1000 MCG/ML IJ SOLN: INTRAMUSCULAR | 11 refills | Status: DC

## 2022-10-19 ENCOUNTER — Ambulatory Visit: Payer: 59 | Admitting: Rehabilitative and Restorative Service Providers"

## 2022-10-21 ENCOUNTER — Ambulatory Visit: Payer: 59 | Admitting: Rehabilitative and Restorative Service Providers"

## 2022-10-26 ENCOUNTER — Encounter: Payer: 59 | Admitting: Rehabilitative and Restorative Service Providers"

## 2022-11-04 ENCOUNTER — Encounter: Payer: Self-pay | Admitting: Internal Medicine

## 2022-11-04 ENCOUNTER — Ambulatory Visit: Payer: 59 | Admitting: Internal Medicine

## 2022-11-04 VITALS — BP 130/84 | HR 97 | Temp 98.4°F | Wt 163.4 lb

## 2022-11-04 DIAGNOSIS — G5712 Meralgia paresthetica, left lower limb: Secondary | ICD-10-CM

## 2022-11-04 MED ORDER — MELOXICAM 7.5 MG PO TABS: 7.5000 mg | ORAL_TABLET | Freq: Every day | ORAL | 0 refills | Status: DC

## 2022-11-04 NOTE — Progress Notes (Signed)
Established Patient Office Visit     CC/Reason for Visit: Continued left upper thigh pain  HPI: Kathleen Powell is a 37 y.o. female who is coming in today for the above mentioned reasons.  She has now been seen twice in the office by different provider and once in the ED for this problem.  She has tried prednisone and physical therapy without much relief.  Pain is located on the left anterior lateral thigh.  It does not involve her knee.  She feels numbness in that area and pain.  She is worried about blood clots.  She has no back pain, no buttock pain.  No weakness.  Past Medical/Surgical History: Past Medical History:  Diagnosis Date   Migraine    Migraine    No pertinent past medical history    Tobacco abuse    Vertigo     Past Surgical History:  Procedure Laterality Date   CESAREAN SECTION  11/27/2011   Procedure: CESAREAN SECTION;  Surgeon: Kathreen Cosier, MD;  Location: [facility];  Service: Obstetrics;  Laterality: N/A;  Primary cesarean section with delivery of baby boy at 67. Apgars 8/9.    Social History:  reports that she has been smoking cigarettes. She started smoking about 8 months ago. She has a 0.4 pack-year smoking history. She has quit using smokeless tobacco. She reports current alcohol use. She reports that she does not use drugs.  Allergies: No Known Allergies  Family History:  Family History  Problem Relation Age of Onset   Hypertension Mother    Cancer Mother        thyroid   Cataracts Mother    Heart disease Father        pace maker   Kidney failure Father    Asthma Sister    Heart disease Maternal Grandmother        congestive heart failure   Hypertension Maternal Grandmother    Heart attack Maternal Grandmother    Gout Maternal Grandfather    Colon cancer Paternal Grandfather    Colon cancer Paternal Uncle    Anesthesia problems Neg Hx      Current Outpatient Medications:    cyanocobalamin (VITAMIN B12) 1000 MCG/ML injection,  Inject 1 ml into the muscle once a week for a month.  Then injected 1 ml once a month thereafter, Disp: 6 mL, Rfl: 11   HYDROcodone-acetaminophen (NORCO) 5-325 MG tablet, Take 1-2 tablets by mouth every 6 (six) hours as needed., Disp: 12 tablet, Rfl: 0   ibuprofen (ADVIL) 800 MG tablet, Take 1 tablet (800 mg total) by mouth every 8 (eight) hours as needed., Disp: 30 tablet, Rfl: 5   meloxicam (MOBIC) 7.5 MG tablet, Take 1 tablet (7.5 mg total) by mouth daily., Disp: 30 tablet, Rfl: 0   Norethindrone Acetate-Ethinyl Estrad-FE (LOESTRIN 24 FE) 1-20 MG-MCG(24) tablet, Take 1 tablet by mouth daily., Disp: 28 tablet, Rfl: 11   SYRINGE-NEEDLE, DISP, 3 ML (BD SAFETYGLIDE SYRINGE/NEEDLE) 25G X 1" 3 ML MISC, Use for B12 injections, Disp: 100 each, Rfl: 3   Vitamin D, Ergocalciferol, (DRISDOL) 1.25 MG (50000 UNIT) CAPS capsule, Take 1 capsule (50,000 Units total) by mouth every 7 (seven) days for 12 doses., Disp: 12 capsule, Rfl: 0  Review of Systems:  Negative unless indicated in HPI.   Physical Exam: Vitals:   11/04/22 0659  BP: 130/84  Pulse: 97  Temp: 98.4 F (36.9 C)  TempSrc: Oral  SpO2: 100%  Weight: 163 lb 6.4 oz (  74.1 kg)    Body mass index is 28.95 kg/m.   Physical Exam Musculoskeletal:     Right upper leg: Normal.     Left upper leg: Normal.     Right knee: Normal.     Left knee: Normal.     Right lower leg: Normal. No edema.     Left lower leg: Normal. No deformity. No edema.     Left ankle: Normal.  Neurological:     Comments: Decreased pinprick sensation to left anterior lateral thigh.      Impression and Plan:  Meralgia paresthetica of left side -     Ambulatory referral to Sports Medicine -     Meloxicam; Take 1 tablet (7.5 mg total) by mouth daily.  Dispense: 30 tablet; Refill: 0   -Suspect this anterior lateral left upper thigh pain to be meralgia paresthetica (entrapment of femoral cutaneous nerve).  Doubt this is a lumbar radiculopathy as she has no back or  buttock pain.  She has already tried physical therapy and prednisone without much relief.  Will send in some meloxicam and refer to sports medicine for further evaluation.  This has been persistent for about 3 months. -She also has vitamin B12 deficiency and has been undergoing weekly B12 replacement.   Time spent:31 minutes reviewing chart, interviewing and examining patient and formulating plan of care.     Chaya Jan, MD [facility] at [facility]

## 2022-11-09 ENCOUNTER — Ambulatory Visit (INDEPENDENT_AMBULATORY_CARE_PROVIDER_SITE_OTHER): Payer: 59

## 2022-11-09 ENCOUNTER — Ambulatory Visit: Payer: 59 | Admitting: Family Medicine

## 2022-11-09 VITALS — BP 110/82 | HR 80 | Ht 63.0 in | Wt 163.0 lb

## 2022-11-09 DIAGNOSIS — M79652 Pain in left thigh: Secondary | ICD-10-CM

## 2022-11-09 NOTE — Patient Instructions (Signed)
Thank you for coming in today.   Please get an Xray today before you leave   You should hear from MRI scheduling within 1 week. If you do not hear please let me know.    Recheck when we get the results back from the MRI.

## 2022-11-09 NOTE — Progress Notes (Signed)
I, Stevenson Clinch, CMA acting as a scribe for Clementeen Graham, MD.  Kathleen Powell is a 37 y.o. female who presents to [facility] at [facility] today for anterior lateral upper thigh pain. Sx present x 3 months. Denies lower back pain or pain in the gluteal region. Has tried Prednisone and PT with minimal relief of sx. Was prescribed Meloxicam by PCP and referred to Dr. Denyse Amass. Got short-term relief with Prednisone. No relief with PT or Meloxicam.   Diagnostic Imaging: 09/24/19 - XR L-Spine  Pertinent review of systems: no fever or chills  Relevant historical information: Chronic migraine   Exam:  BP 110/82   Pulse 80   Ht 5\' 3"  (1.6 m)   Wt 163 lb (73.9 kg)   LMP 09/21/2022 (Approximate)   BMI 28.87 kg/m  General: Well Developed, well nourished, and in no acute distress.   MSK: Left hip: Normal-appearing Nontender to palpation. Normal motion however she does experience pain with hip flexion beyond 90 degrees especially with internal rotation. Hip flexion strength is diminished and painful.  L-spine nontender to palpation spinal midline normal lumbar motion.    Lab and Radiology Results  X-ray images lumbar spine and left hip obtained today personally and independently interpreted  Lumbar spine: No acute fractures.  No severe DDD.  Normal alignment  Left hip: No significant or severe left hip arthritis.  No AVN collapse.  No acute fractures are visible.  Await formal radiology review    Assessment and Plan: 37 y.o. female with chronic left anterior predominantly and more mildly lateral thigh pain. She is able to reproduce her pain with standing and other strength based activities.  Additionally range of motion reproduces her pain.  This would indicate that her pain is more likely coming from something intrinsic to the hip joint itself like AVN or labrum tear.  Meralgia paresthetica is a possibility but the distribution of her symptoms are not entirely  consistent with a lateral femoral cutaneous nerve.  It is possible she has an anatomical variation.  She has had an excellent trial directed by her primary care physician over the last 2 months including dedicated physical therapy which unfortunately was not as successful as he would like it to be.  Plan today for x-rays and MRI arthrogram of the left hip.  If this is unrevealing we may consider a diagnostic and potentially therapeutic lateral femoral cutaneous nerve injection.   PDMP not reviewed this encounter. Orders Placed This Encounter  Procedures   DG Lumbar Spine 2-3 Views    Standing Status:   Future    Number of Occurrences:   1    Standing Expiration Date:   11/09/2023    Order Specific Question:   Reason for Exam (SYMPTOM  OR DIAGNOSIS REQUIRED)    Answer:   eval left anterior hip pain    Order Specific Question:   Is patient pregnant?    Answer:   No    Order Specific Question:   Preferred imaging location?    Answer:   [facility]   DG HIP UNILAT WITH PELVIS 2-3 VIEWS LEFT    Standing Status:   Future    Number of Occurrences:   1    Standing Expiration Date:   11/09/2023    Order Specific Question:   Reason for Exam (SYMPTOM  OR DIAGNOSIS REQUIRED)    Answer:   eval hip pain left    Order Specific Question:   Is patient pregnant?  Answer:   No    Order Specific Question:   Preferred imaging location?    Answer:   [facility]   MR HIP LEFT W CONTRAST    Schedule with Dr T 1 hour prior to MRI for arthrogram injection.  NO IV contrast. Arthrogram only.    Standing Status:   Future    Standing Expiration Date:   11/09/2023    Scheduling Instructions:     Schedule with Dr T 1 hour prior to MRI for arthrogram injection.      NO IV contrast. Arthrogram only.    Order Specific Question:   Reason for Exam (SYMPTOM  OR DIAGNOSIS REQUIRED)    Answer:   left hip pain    Order Specific Question:   If indicated for the ordered procedure, I authorize the  administration of contrast media per Radiology protocol    Answer:   Yes    Order Specific Question:   What is the patient's sedation requirement?    Answer:   No Sedation    Order Specific Question:   Does the patient have a pacemaker or implanted devices?    Answer:   No    Order Specific Question:   Preferred imaging location?    Answer:   [facility] (table limit-350lbs)   No orders of the defined types were placed in this encounter.    Discussed warning signs or symptoms. Please see discharge instructions. Patient expresses understanding.   The above documentation has been reviewed and is accurate and complete Clementeen Graham, M.D.

## 2022-11-22 ENCOUNTER — Other Ambulatory Visit: Payer: 59

## 2022-11-22 ENCOUNTER — Ambulatory Visit: Payer: 59 | Admitting: Sports Medicine

## 2022-11-22 NOTE — Progress Notes (Signed)
Lumbar spine x-ray looks normal to radiology.

## 2022-11-22 NOTE — Progress Notes (Signed)
Left hip x-ray shows mild arthritis changes

## 2022-12-05 ENCOUNTER — Encounter (HOSPITAL_BASED_OUTPATIENT_CLINIC_OR_DEPARTMENT_OTHER): Payer: Self-pay | Admitting: Emergency Medicine

## 2022-12-05 ENCOUNTER — Emergency Department (HOSPITAL_BASED_OUTPATIENT_CLINIC_OR_DEPARTMENT_OTHER)
Admission: EM | Admit: 2022-12-05 | Discharge: 2022-12-05 | Disposition: A | Discharge status: Home / Self Care | Payer: 59 | Attending: Emergency Medicine | Admitting: Emergency Medicine

## 2022-12-05 ENCOUNTER — Emergency Department (HOSPITAL_BASED_OUTPATIENT_CLINIC_OR_DEPARTMENT_OTHER): Payer: 59 | Admitting: Radiology

## 2022-12-05 DIAGNOSIS — M549 Dorsalgia, unspecified: Secondary | ICD-10-CM | POA: Insufficient documentation

## 2022-12-05 DIAGNOSIS — M25552 Pain in left hip: Secondary | ICD-10-CM | POA: Diagnosis present

## 2022-12-05 DIAGNOSIS — M25562 Pain in left knee: Secondary | ICD-10-CM | POA: Diagnosis not present

## 2022-12-05 DIAGNOSIS — R42 Dizziness and giddiness: Secondary | ICD-10-CM | POA: Diagnosis not present

## 2022-12-05 DIAGNOSIS — Y92410 Unspecified street and highway as the place of occurrence of the external cause: Secondary | ICD-10-CM | POA: Diagnosis not present

## 2022-12-05 LAB — PREGNANCY, URINE: Preg Test, Ur: NEGATIVE

## 2022-12-05 MED ORDER — KETOROLAC TROMETHAMINE 15 MG/ML IJ SOLN
15.0000 mg | Freq: Once | INTRAMUSCULAR | Status: AC
  Administered 2022-12-05: 15 mg via INTRAMUSCULAR
  Filled 2022-12-05: qty 1

## 2022-12-05 MED ORDER — CELECOXIB 200 MG PO CAPS: 200.0000 mg | ORAL_CAPSULE | Freq: Two times a day (BID) | ORAL | 0 refills | Status: DC
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
## 2022-12-06 ENCOUNTER — Other Ambulatory Visit: Payer: 59

## 2022-12-06 ENCOUNTER — Ambulatory Visit: Payer: 59 | Admitting: Sports Medicine

## 2022-12-13 ENCOUNTER — Telehealth: Payer: Self-pay | Admitting: Family Medicine

## 2022-12-13 DIAGNOSIS — M25552 Pain in left hip: Secondary | ICD-10-CM

## 2022-12-13 NOTE — Telephone Encounter (Signed)
Orders entered

## 2022-12-16 ENCOUNTER — Ambulatory Visit
Admission: RE | Admit: 2022-12-16 | Discharge: 2022-12-16 | Disposition: A | Discharge status: Home / Self Care | Payer: 59 | Source: Ambulatory Visit | Attending: Family Medicine | Admitting: Family Medicine

## 2022-12-16 DIAGNOSIS — M25552 Pain in left hip: Secondary | ICD-10-CM | POA: Diagnosis present

## 2022-12-16 DIAGNOSIS — M79652 Pain in left thigh: Secondary | ICD-10-CM

## 2022-12-16 MED ORDER — LIDOCAINE HCL (PF) 1 % IJ SOLN
10.0000 mL | Freq: Once | INTRAMUSCULAR | Status: AC
  Administered 2022-12-16: 7 mL via INTRADERMAL
  Filled 2022-12-16: qty 10

## 2022-12-16 MED ORDER — IOHEXOL 180 MG/ML  SOLN
15.0000 mL | Freq: Once | INTRAMUSCULAR | Status: AC | PRN
  Administered 2022-12-16: 15 mL

## 2022-12-16 MED ORDER — GADOBUTROL 1 MMOL/ML IV SOLN
0.0500 mL | Freq: Once | INTRAVENOUS | Status: AC | PRN
  Administered 2022-12-16: 0.05 mL

## 2022-12-29 NOTE — Progress Notes (Signed)
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  The left hip MRI looks worse with flattening of the ball of the ball-and-socket joint.  Please schedule an appointment with me soon to follow-up these results and talk about treatment plan and options. Avascular necrosis is where the blood supply to the hip fails ultimately causing the hip joint to stop working right.
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
## 2022-12-30 ENCOUNTER — Encounter: Payer: Self-pay | Admitting: Family Medicine

## 2022-12-30 ENCOUNTER — Ambulatory Visit: Payer: 59 | Admitting: Family Medicine

## 2022-12-30 VITALS — BP 124/84 | HR 101 | Ht 63.0 in | Wt 161.0 lb

## 2022-12-30 DIAGNOSIS — M87051 Idiopathic aseptic necrosis of right femur: Secondary | ICD-10-CM

## 2022-12-30 DIAGNOSIS — M87052 Idiopathic aseptic necrosis of left femur: Secondary | ICD-10-CM

## 2022-12-30 DIAGNOSIS — Z720 Tobacco use: Secondary | ICD-10-CM | POA: Diagnosis not present

## 2022-12-30 MED ORDER — ALENDRONATE SODIUM 70 MG PO TABS: 70.0000 mg | ORAL_TABLET | ORAL | 11 refills | Status: DC

## 2022-12-30 NOTE — Patient Instructions (Addendum)
Thank you for coming in today.   START alendronate. This could help AVN.   Work on quitting smoking. Try replacing the cigarettes  I will let you know what the orthopedic surgeons think. If they say it's a possibility I would recommend a visit to discuss it.   You should hear soon from Dr Steward Drone about an appointment.

## 2023-01-19 ENCOUNTER — Ambulatory Visit (HOSPITAL_BASED_OUTPATIENT_CLINIC_OR_DEPARTMENT_OTHER): Payer: Self-pay | Admitting: Orthopaedic Surgery

## 2023-01-19 ENCOUNTER — Ambulatory Visit (HOSPITAL_BASED_OUTPATIENT_CLINIC_OR_DEPARTMENT_OTHER): Payer: 59 | Admitting: Orthopaedic Surgery

## 2023-01-19 ENCOUNTER — Other Ambulatory Visit (HOSPITAL_BASED_OUTPATIENT_CLINIC_OR_DEPARTMENT_OTHER): Payer: Self-pay

## 2023-01-19 DIAGNOSIS — M87052 Idiopathic aseptic necrosis of left femur: Secondary | ICD-10-CM | POA: Diagnosis not present

## 2023-01-19 MED ORDER — IBUPROFEN 800 MG PO TABS
800.0000 mg | ORAL_TABLET | Freq: Three times a day (TID) | ORAL | 0 refills | Status: AC
  Filled 2023-01-19: qty 30, 10d supply, fill #0

## 2023-01-19 MED ORDER — OXYCODONE HCL 5 MG PO TABS
5.0000 mg | ORAL_TABLET | ORAL | 0 refills | Status: DC | PRN
Start: 2023-01-19 — End: 2023-06-13
  Filled 2023-01-19: qty 10, 2d supply, fill #0

## 2023-01-19 MED ORDER — ACETAMINOPHEN 500 MG PO TABS
500.0000 mg | ORAL_TABLET | Freq: Three times a day (TID) | ORAL | 0 refills | Status: AC
  Filled 2023-01-19: qty 30, 10d supply, fill #0

## 2023-01-19 MED ORDER — ASPIRIN 325 MG PO TBEC
325.0000 mg | DELAYED_RELEASE_TABLET | Freq: Every day | ORAL | 0 refills | Status: DC
  Filled 2023-01-19: qty 14, 14d supply, fill #0

## 2023-01-19 NOTE — Progress Notes (Signed)
Chief Complaint: Left hip pain     History of Present Illness:    Kathleen Powell is a 37 y.o. female presents today with on going left hip pain since the summer 2024.  She states that since Father's Day she has noticed hip and groin based pain.  This does radiate to the posterior buttocks.  She states that this pain does increase with going up and down stairs.  She is having a hard time with rotation and feeling like the hip will get stuck.  She has tried physical therapy as well as ibuprofen.  None of this is providing significant relief.  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  She does have an 28 year old son who she is hoping to be active with but feels like she is limited due to the hip pain.  She is here as a referral from Dr. 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  Denies a history of steroid usage.  Drinks alcohol only socially    PMH/PSH/Family History/Social History/Meds/Allergies:    Past Medical History:  Diagnosis Date   Migraine    Migraine    No pertinent past medical history    Tobacco abuse    Vertigo    Past Surgical History:  Procedure Laterality Date   CESAREAN SECTION  11/27/2011   Procedure: CESAREAN SECTION;  Surgeon: Kathreen Cosier, MD;  Location: [facility];  Service: Obstetrics;  Laterality: N/A;  Primary cesarean section with delivery of baby boy at 64. Apgars 8/9.   Social History   Socioeconomic History   Marital status: Single    Spouse name: Not on file   Number of children: Not on file   Years of education: Not on file   Highest education level: Some college, no degree  Occupational History   Not on file  Tobacco Use   Smoking status: Every Day    Current packs/day: 0.50    Average packs/day: 0.5 packs/day for 0.9 years (0.5 ttl pk-yrs)    Types: Cigarettes    Start date: 2024    Last attempt to quit: 09/09/2011   Smokeless tobacco: Former   Tobacco comments:    Recently quit has not smoked in 3 months    Vaping Use   Vaping status: Never Used  Substance and Sexual Activity   Alcohol use: Yes    Comment: "once a weekend"   Drug use: No   Sexual activity: Yes    Birth control/protection: None  Other Topics Concern   Not on file  Social History Narrative   Lives at home with her mother & son   Right handed   Caffeine: 3 cups a week    Social Determinants of Health   Financial Resource Strain: Not on file  Food Insecurity: Not on file  Transportation Needs: Not on file  Physical Activity: Not on file  Stress: Not on file  Social Connections: Not on file   Family History  Problem Relation Age of Onset   Hypertension Mother    Cancer Mother        thyroid   Cataracts Mother    Heart disease Father        pace maker   Kidney failure Father    Asthma Sister    Heart disease Maternal Grandmother        congestive heart failure   Hypertension Maternal Grandmother    Heart attack Maternal Grandmother    Gout Maternal Grandfather  Colon cancer Paternal Grandfather    Colon cancer Paternal Uncle    Anesthesia problems Neg Hx    No Known Allergies Current Outpatient Medications  Medication Sig Dispense Refill   acetaminophen (TYLENOL) 500 MG tablet Take 1 tablet (500 mg total) by mouth every 8 (eight) hours for 10 days. 30 tablet 0   aspirin EC 325 MG tablet Take 1 tablet (325 mg total) by mouth daily. 14 tablet 0   ibuprofen (ADVIL) 800 MG tablet Take 1 tablet (800 mg total) by mouth every 8 (eight) hours for 10 days. Please take with food, please alternate with acetaminophen 30 tablet 0   oxyCODONE (ROXICODONE) 5 MG immediate release tablet Take 1 tablet (5 mg total) by mouth every 4 (four) hours as needed for severe pain (pain score 7-10) or breakthrough pain. 10 tablet 0   alendronate (FOSAMAX) 70 MG tablet Take 1 tablet (70 mg total) by mouth every 7 (seven) days. 4 tablet 11   celecoxib (CELEBREX) 200 MG capsule Take 1 capsule (200 mg total) by mouth 2 (two) times  daily. 20 capsule 0   cyanocobalamin (VITAMIN B12) 1000 MCG/ML injection Inject 1 ml into the muscle once a week for a month.  Then injected 1 ml once a month thereafter 6 mL 11   HYDROcodone-acetaminophen (NORCO) 5-325 MG tablet Take 1-2 tablets by mouth every 6 (six) hours as needed. 12 tablet 0   ibuprofen (ADVIL) 800 MG tablet Take 1 tablet (800 mg total) by mouth every 8 (eight) hours as needed. 30 tablet 5   meloxicam (MOBIC) 7.5 MG tablet Take 1 tablet (7.5 mg total) by mouth daily. 30 tablet 0   Norethindrone Acetate-Ethinyl Estrad-FE (LOESTRIN 24 FE) 1-20 MG-MCG(24) tablet Take 1 tablet by mouth daily. 28 tablet 11   SYRINGE-NEEDLE, DISP, 3 ML (BD SAFETYGLIDE SYRINGE/NEEDLE) 25G X 1" 3 ML MISC Use for B12 injections 100 each 3   No current facility-administered medications for this visit.   No results found.  Review of Systems:   A ROS was performed including pertinent positives and negatives as documented in the HPI.  Physical Exam :   Constitutional: NAD and appears stated age Neurological: Alert and oriented Psych: Appropriate affect and cooperative There were no vitals taken for this visit.   Comprehensive Musculoskeletal Exam:    Inspection Right Left  Skin No atrophy or gross abnormalities appreciated No atrophy or gross abnormalities appreciated  Palpation    Tenderness None Femoral acetabular  Crepitus None None  Range of Motion    Flexion (passive) 120 120  Extension 30 30  IR 30 30 with pain  ER 45 30 with pain  Strength    Flexion  5/5 5/5  Extension 5/5 5/5  Special Tests    FABER Negative Positive  FADIR Negative Positive  ER Lag/Capsular Insufficiency Negative Negative  Instability Negative Negative  Sacroiliac pain Negative  Negative   Instability    Generalized Laxity No No  Neurologic    sciatic, femoral, obturator nerves intact to light sensation  Vascular/Lymphatic    DP pulse 2+ 2+  Lumbar Exam    Patient has symmetric lumbar range of  motion with negative pain referral to hip      Imaging:   Xray (4 views AP pelvis, left hip): No evidence of subchondral collapse  MRI (left hip): Avascular necrosis involving the weightbearing dome of the left hip without evidence of subchondral collapse   I personally reviewed and interpreted the radiographs.   Assessment  and Plan:   37 y.o. female with left hip pain in the setting of avascular necrosis.  At this time she has tried physical therapy and anti-inflammatories.  I did describe that her left hip pain is most likely idiopathic in nature.  We did discuss treatment options.  At this time she has had a good trial of conservative management.  Given the fact that she does have a somewhat elevated combined necrotic angle I did discuss that she would be at risk for collapse.  Given this we did discuss the possibility of a left hip decompression with bone marrow aspirate concentration placement.  I did discuss the rehab associated with this I did discuss that this would require 2 weeks of nonweightbearing.  After discussion of risks and benefits she is ultimately elected for this.  -Plan for left hip core decompression with left hip iliac crest bone marrow biopsy   After a lengthy discussion of treatment options, including risks, benefits, alternatives, complications of surgical and nonsurgical conservative options, the patient elected surgical repair.   The patient  is aware of the material risks  and complications including, but not limited to injury to adjacent structures, neurovascular injury, infection, numbness, bleeding, implant failure, thermal burns, stiffness, persistent pain, failure to heal, disease transmission from allograft, need for further surgery, dislocation, anesthetic risks, blood clots, risks of death,and others. The probabilities of surgical success and failure discussed with patient given their particular co-morbidities.The time and nature of expected  rehabilitation and recovery was discussed.The patient's questions were all answered preoperatively.  No barriers to understanding were noted. I explained the natural history of the disease process and Rx rationale.  I explained to the patient what I considered to be reasonable expectations given their personal situation.  The final treatment plan was arrived at through a shared patient decision making process model.    I personally saw and evaluated the patient, and participated in the management and treatment plan.  Huel Cote, MD Attending Physician, Orthopedic Surgery  This document was dictated using Dragon voice recognition software. A reasonable attempt at proof reading has been made to minimize errors.

## 2023-01-24 ENCOUNTER — Telehealth: Payer: Self-pay | Admitting: Family Medicine

## 2023-01-24 NOTE — Telephone Encounter (Signed)
Patient called stating that she will probably not be able to have surgery until February.  She said that she is still having a lot of trouble lifting her left leg and asked if we had any recommendations for mobility.

## 2023-01-25 NOTE — Telephone Encounter (Signed)
Called pt at [phone] and left VM to call the office.

## 2023-01-25 NOTE — Telephone Encounter (Signed)
Spoke with patient and advised per Dr. Denyse Amass. Pt verbalized understanding.   Discussed walker vs cane for mobility. Pt will reach out on Friday if she has not heard back about scheduling surgery.

## 2023-01-25 NOTE — Telephone Encounter (Signed)
You just need surgery I think.  Tylenol can help with pain but I do not think you are going to get better until you have surgery.

## 2023-01-28 ENCOUNTER — Telehealth: Payer: Self-pay | Admitting: Family Medicine

## 2023-01-28 NOTE — Telephone Encounter (Signed)
Pt calling for Kathleen Powell, wanted her to know that as of today she has NOT heard from [facility] office for surgery scheduling and has left messages for their scheduler with no return call.

## 2023-01-31 NOTE — Telephone Encounter (Signed)
Hey Bri,  Pt was referred on 12/30/22. Will you follow up on referral please?  Thank you in advance!

## 2023-01-31 NOTE — Telephone Encounter (Signed)
Called [facility] to f/u on referral, was advised that pt saw Dr. Steward Drone on 12/4.   Called and spoke with patient, she notes that she did have the initial eval and the plan was for the surgery coordinator to call her to schedule surgery. She has not heard from the scheduler. She notes that she has called Dr. Serena Croissant office several times and has not gotten a call back about scheduling surgery.

## 2023-01-31 NOTE — Telephone Encounter (Signed)
Called [facility] and left VM for April Beavers, surgery scheduler.   Called pt and advised that I had to leave a VM but I will continue to f/u and keep pt posted.

## 2023-01-31 NOTE — Telephone Encounter (Signed)
Para March, she has tried calling several times but has not gotten a call from from the scheduler. Do you know who we should try to get in touch with?

## 2023-02-01 NOTE — Telephone Encounter (Signed)
April returned call and advised that she spoke with [place] today and appt has been scheduled for 04/11/23.   Called [place] and advised/confirmed.

## 2023-02-01 NOTE — Telephone Encounter (Signed)
Called and left VM for scheduler April at [facility] to call the office with update for surgery scheduling.

## 2023-02-11 ENCOUNTER — Other Ambulatory Visit: Payer: Self-pay | Admitting: Obstetrics

## 2023-02-11 ENCOUNTER — Encounter: Payer: Self-pay | Admitting: Obstetrics

## 2023-02-11 DIAGNOSIS — Z3041 Encounter for surveillance of contraceptive pills: Secondary | ICD-10-CM

## 2023-02-11 MED ORDER — HAILEY 24 FE 1-20 MG-MCG(24) PO TABS: 1.0000 | ORAL_TABLET | Freq: Every day | ORAL | 3 refills | Status: DC

## 2023-02-24 ENCOUNTER — Telehealth: Payer: Self-pay

## 2023-02-24 NOTE — Telephone Encounter (Signed)
 Received 02/23/23 Due 03/13/23  Claim # [id number]

## 2023-03-03 NOTE — Telephone Encounter (Signed)
Called and spoke with patient. She believes that these forms were sent to [place] in error and were to be sent to her Surgeon at [facility] to be completed around her upcoming surgery in March 2025. She confirmed that she has not missed any work that would require form completion by [place].   I will scan a blank copy of the form to pt chart and she will contact us if she or [employer] needs any information from [place].

## 2023-03-09 ENCOUNTER — Other Ambulatory Visit (HOSPITAL_BASED_OUTPATIENT_CLINIC_OR_DEPARTMENT_OTHER): Payer: Self-pay | Admitting: Orthopaedic Surgery

## 2023-03-09 DIAGNOSIS — M87052 Idiopathic aseptic necrosis of left femur: Secondary | ICD-10-CM

## 2023-03-14 ENCOUNTER — Other Ambulatory Visit: Payer: Self-pay

## 2023-03-14 ENCOUNTER — Emergency Department (HOSPITAL_BASED_OUTPATIENT_CLINIC_OR_DEPARTMENT_OTHER): Payer: 59 | Admitting: Radiology

## 2023-03-14 ENCOUNTER — Emergency Department (HOSPITAL_BASED_OUTPATIENT_CLINIC_OR_DEPARTMENT_OTHER)
Admission: EM | Admit: 2023-03-14 | Discharge: 2023-03-14 | Disposition: A | Discharge status: Home / Self Care | Payer: 59 | Attending: Emergency Medicine | Admitting: Emergency Medicine

## 2023-03-14 ENCOUNTER — Encounter (HOSPITAL_BASED_OUTPATIENT_CLINIC_OR_DEPARTMENT_OTHER): Payer: Self-pay | Admitting: Emergency Medicine

## 2023-03-14 DIAGNOSIS — R0602 Shortness of breath: Secondary | ICD-10-CM | POA: Diagnosis not present

## 2023-03-14 DIAGNOSIS — Z7982 Long term (current) use of aspirin: Secondary | ICD-10-CM | POA: Insufficient documentation

## 2023-03-14 DIAGNOSIS — R0789 Other chest pain: Secondary | ICD-10-CM | POA: Diagnosis present

## 2023-03-14 DIAGNOSIS — Z79899 Other long term (current) drug therapy: Secondary | ICD-10-CM | POA: Insufficient documentation

## 2023-03-14 DIAGNOSIS — R079 Chest pain, unspecified: Secondary | ICD-10-CM

## 2023-03-14 LAB — BASIC METABOLIC PANEL
Anion gap: 8 (ref 5–15)
BUN: 14 mg/dL (ref 6–20)
CO2: 24 mmol/L (ref 22–32)
Calcium: 9.1 mg/dL (ref 8.9–10.3)
Chloride: 105 mmol/L (ref 98–111)
Creatinine, Ser: 0.79 mg/dL (ref 0.44–1.00)
GFR, Estimated: >60 mL/min (ref >60)
Glucose, Bld: 128 mg/dL — ABNORMAL HIGH (ref 70–99)
Potassium: 3.9 mmol/L (ref 3.5–5.1)
Sodium: 137 mmol/L (ref 135–145)

## 2023-03-14 LAB — D-DIMER, QUANTITATIVE: D-Dimer, Quant: 0.37 ug{FEU}/mL (ref 0.00–0.50)

## 2023-03-14 LAB — CBC
HCT: 36.2 % (ref 36.0–46.0)
Hemoglobin: 12.1 g/dL (ref 12.0–15.0)
MCH: 30 pg (ref 26.0–34.0)
MCHC: 33.4 g/dL (ref 30.0–36.0)
MCV: 89.6 fL (ref 80.0–100.0)
Platelets: 379 10*3/uL (ref 150–400)
RBC: 4.04 MIL/uL (ref 3.87–5.11)
RDW: 12.7 % (ref 11.5–15.5)
WBC: 6.2 10*3/uL (ref 4.0–10.5)
nRBC: 0 % (ref 0.0–0.2)

## 2023-03-14 LAB — PREGNANCY, URINE: Preg Test, Ur: NEGATIVE

## 2023-03-14 LAB — TROPONIN I (HIGH SENSITIVITY)
Troponin I (High Sensitivity): <2 ng/L (ref <18)
Troponin I (High Sensitivity): <2 ng/L (ref <18)

## 2023-03-14 NOTE — ED Provider Triage Note (Signed)
Emergency Medicine Provider Triage Evaluation Note  Kathleen Powell , a 38 y.o. female  was evaluated in triage.  Pt complains of chest pain.  Review of Systems  Positive: Chest pain, SOB Negative: Cough, fever, vomiting  Physical Exam  BP 139/89 (BP Location: Right Arm)   Pulse 86   Temp 98 F (36.7 C)   Resp 20   Ht 5\' 3"  (1.6 m)   Wt 74.8 kg   SpO2 100%   BMI 29.23 kg/m  Gen:   Awake, no distress   Resp:  Normal effort  MSK:   Moves extremities without difficulty  Other:    Medical Decision Making  Medically screening exam initiated at 7:57 PM.  Appropriate orders placed.  Kathleen Powell was informed that the remainder of the evaluation will be completed by another provider, this initial triage assessment does not replace that evaluation, and the importance of remaining in the ED until their evaluation is complete.  Sudden onset chest pain 30 minutes prior to arrival. Pain is under left breast. Does not radiate. No cough, fever, vomiting. On OPC, no history of clots. No recent travel. Quit smoking one week ago. Given ASA and NTG in route without change.    Elpidio Anis, PA-C 03/14/23 1959

## 2023-03-14 NOTE — ED Provider Notes (Signed)
[place] EMERGENCY DEPARTMENT AT [facility] Provider Note   CSN: [id number] Arrival date & time: 03/14/23  1936     History  Chief Complaint  Patient presents with   Shortness of Breath    Kathleen Powell is a 38 y.o. female.  This is a 38 year old female who presents emergency room today due to a sudden sharp pain in her chest that occurred around 6 PM and lasted for about 15 minutes before resolving.  Patient currently without any pain.   Shortness of Breath      Home Medications Prior to Admission medications   Medication Sig Start Date End Date Taking? Authorizing Provider  alendronate (FOSAMAX) 70 MG tablet Take 1 tablet (70 mg total) by mouth every 7 (seven) days. 12/30/22   Rodolph Bong, MD  aspirin EC 325 MG tablet Take 1 tablet (325 mg total) by mouth daily. 01/19/23   Huel Cote, MD  celecoxib (CELEBREX) 200 MG capsule Take 1 capsule (200 mg total) by mouth 2 (two) times daily. 12/05/22   Glyn Ade, MD  cyanocobalamin (VITAMIN B12) 1000 MCG/ML injection Inject 1 ml into the muscle once a week for a month.  Then injected 1 ml once a month thereafter 10/14/22   Philip Aspen, Limmie Patricia, MD  HYDROcodone-acetaminophen [employer]) 5-325 MG tablet Take 1-2 tablets by mouth every 6 (six) hours as needed. 08/31/22   Geoffery Lyons, MD  ibuprofen (ADVIL) 800 MG tablet Take 1 tablet (800 mg total) by mouth every 8 (eight) hours as needed. 05/18/22   Brock Bad, MD  meloxicam (MOBIC) 7.5 MG tablet Take 1 tablet (7.5 mg total) by mouth daily. 11/04/22   Philip Aspen, Limmie Patricia, MD  Norethindrone Acetate-Ethinyl Estrad-FE (HAILEY 24 FE) 1-20 MG-MCG(24) tablet Take 1 tablet by mouth daily. 02/11/23   Reva Bores, MD  oxyCODONE (ROXICODONE) 5 MG immediate release tablet Take 1 tablet (5 mg total) by mouth every 4 (four) hours as needed for severe pain (pain score 7-10) or breakthrough pain. 01/19/23   Huel Cote, MD  SYRINGE-NEEDLE, DISP, 3 ML (BD  SAFETYGLIDE SYRINGE/NEEDLE) 25G X 1" 3 ML MISC Use for B12 injections 10/14/22   Philip Aspen, Limmie Patricia, MD      Allergies    Patient has no known allergies.    Review of Systems   Review of Systems  Respiratory:  Positive for shortness of breath.     Physical Exam Updated Vital Signs BP 124/82   Pulse 73   Temp 98 F (36.7 C)   Resp 20   Ht 5\' 3"  (1.6 m)   Wt 74.8 kg   SpO2 99%   BMI 29.23 kg/m  Physical Exam Vitals reviewed.  Cardiovascular:     Rate and Rhythm: Normal rate.  Pulmonary:     Effort: Pulmonary effort is normal.     Breath sounds: No decreased breath sounds, wheezing or rhonchi.  Chest:     Chest wall: No mass or tenderness.  Abdominal:     Palpations: Abdomen is soft. There is no mass.     Tenderness: There is no abdominal tenderness.  Musculoskeletal:        General: Normal range of motion.     Cervical back: Normal range of motion.  Skin:    General: Skin is warm.  Neurological:     Mental Status: She is alert.     ED Results / Procedures / Treatments   Labs (all labs ordered are listed, but only abnormal  results are displayed) Labs Reviewed  BASIC METABOLIC PANEL - Abnormal; Notable for the following components:      Result Value   Glucose, Bld 128 (*)    All other components within normal limits  CBC  PREGNANCY, URINE  D-DIMER, QUANTITATIVE  TROPONIN I (HIGH SENSITIVITY)  TROPONIN I (HIGH SENSITIVITY)    EKG None  Radiology DG Chest 2 View Result Date: 03/14/2023 CLINICAL DATA:  Chest pain and shortness of breath. EXAM: CHEST - 2 VIEW COMPARISON:  None Available. FINDINGS: The heart and mediastinal contours are within normal limits. No focal consolidation. No pulmonary edema. No pleural effusion. No pneumothorax. No acute osseous abnormality. IMPRESSION: No active cardiopulmonary disease. Electronically Signed   By: Tish Frederickson M.D.   On: 03/14/2023 20:38    Procedures Procedures    Medications Ordered in  ED Medications - No data to display  ED Course/ Medical Decision Making/ A&P                                 Medical Decision Making 38 year old female here today for period of chest pain and shortness of breath since resolved.  Differential diagnoses include ACS, PE, less likely dissection, less likely AAA, less likely pneumothorax, less likely pneumonia.  Plan- on exam, patient overall looks well.  Normal heart and lung sounds.  My independent review the patient's EKG shows normal intervals, no ST segment depressions or elevations, no T wave inversions, no evidence of acute ischemia.  Patient with 2 times negative troponin.  Her D-dimer is negative.  D-dimer ordered due to the patient being on oral contraceptives.  My dependent review the patient's chest x-ray shows no pneumonia, no pneumothorax.  Patient without any abdominal pain or tenderness.  Less likely to be acute intra-abdominal process.  With the patient's symptoms, and her at her baseline, do not believe she requires any additional workup at this time.  Will the patient follow-up with her PCP.  Amount and/or Complexity of Data Reviewed Labs: ordered. Radiology: ordered.           Final Clinical Impression(s) / ED Diagnoses Final diagnoses:  Chest pain, unspecified type    Rx / DC Orders ED Discharge Orders     None         Arletha Pili, DO 03/14/23 2304

## 2023-03-14 NOTE — Discharge Instructions (Addendum)
While you are in the emergency room, you had an EKG done to look for signs of injury to your heart.  This test was normal.  He also had blood work to look for signs of injury to your heart or blood clots.  These tests were negative.  Your pregnancy test was negative.  Your blood work was also normal.  Your chest x-ray was normal.   At this time, I do not have a clear cause for your pain, but it does not appear to be emergency at this time..  I would like you to follow-up with your primary care doctor to discuss these symptoms.  Return to the emergency department for repeated episode of chest pain or shortness of breath.

## 2023-03-14 NOTE — ED Triage Notes (Signed)
C/o CP and SHOB starting today. Given 1 nitroglycerin and 324 asa en route. Also endorses near syncopal episode when symptoms started.

## 2023-03-29 ENCOUNTER — Encounter (HOSPITAL_BASED_OUTPATIENT_CLINIC_OR_DEPARTMENT_OTHER): Payer: Self-pay

## 2023-03-29 ENCOUNTER — Ambulatory Visit (HOSPITAL_BASED_OUTPATIENT_CLINIC_OR_DEPARTMENT_OTHER): Admit: 2023-03-29 | Payer: 59 | Admitting: Orthopaedic Surgery

## 2023-03-29 SURGERY — DECOMPRESSION HIP - CORE
Anesthesia: General | Site: Hip | Laterality: Left

## 2023-04-11 ENCOUNTER — Encounter (HOSPITAL_BASED_OUTPATIENT_CLINIC_OR_DEPARTMENT_OTHER): Payer: 59 | Admitting: Orthopaedic Surgery

## 2023-06-10 ENCOUNTER — Telehealth: Admitting: Family Medicine

## 2023-06-10 ENCOUNTER — Ambulatory Visit: Payer: Self-pay

## 2023-06-10 DIAGNOSIS — R1013 Epigastric pain: Secondary | ICD-10-CM

## 2023-06-10 NOTE — Telephone Encounter (Signed)
  Chief Complaint: abdominal pain Symptoms: moderate to severe intermittent abdominal pain, 1 episode of nausea Frequency: worsening, intermittent over the last week Pertinent Negatives: Patient denies blood in stool, fever, chest pain, abdominal distention  Disposition: [] ED /[] Urgent Care (no appt availability in office) / [] Appointment(In office/virtual)/ [x]  [facility]/ [] Home Care/ [] Refused Recommended Disposition /[] [place] [facility]/ []  Follow-up with PCP Additional Notes: Patient reports that she has been having intermittent episodes of abdominal pain that seems to be worsening over the last few weeks. Patient reports sometimes these episodes will have pain that is moderate to severe, will usually last a few minutes at a time, but reports yesterday she did have 1 episode that lasted around 1 hour. Patient also reports 1 episode of nausea yesterday but states that was the first time.  Patient reports these episodes come on randomly. Attempted to schedule in office appt today, no availability, recommended UC but patient requesting virtual visit to discuss with provider. Virtual visit scheduled today 4/25 at patients request. Patient advised to call back with worsening symptoms. Patient verbalized understanding.      Reason for Disposition  [1] MODERATE pain (e.g., interferes with normal activities) AND [2] comes and goes (cramps) AND [3] present > 24 hours  (Exception: Pain with Vomiting or Diarrhea - see that Guideline.)  Answer Assessment - Initial Assessment Questions 1. LOCATION: "Where does it hurt?"      Upper abdomen 2. RADIATION: "Does the pain shoot anywhere else?" (e.g., chest, back)     denies 3. ONSET: "When did the pain begin?" (e.g., minutes, hours or days ago)      Worsening over the last few weeks 4. SUDDEN: "Gradual or sudden onset?"     gradual 5. PATTERN "Does the pain come and go, or is it constant?"    - If it comes and goes: "How long does it  last?" "Do you have pain now?"     (Note: Comes and goes means the pain is intermittent. It goes away completely between bouts.)    - If constant: "Is it getting better, staying the same, or getting worse?"      (Note: Constant means the pain never goes away completely; most serious pain is constant and gets worse.)      Comes and goes 6. SEVERITY: "How bad is the pain?"  (e.g., Scale 1-10; mild, moderate, or severe)    - MILD (1-3): Doesn't interfere with normal activities, abdomen soft and not tender to touch..     - MODERATE (4-7): Interferes with normal activities or awakens from sleep, abdomen tender to touch.     - SEVERE (8-10): Excruciating pain, doubled over, unable to do any normal activities.       Moderate to severe 7. RECURRENT SYMPTOM: "Have you ever had this type of stomach pain before?" If Yes, ask: "When was the last time?" and "What happened that time?"      Yes, over the past few weeks 8. AGGRAVATING FACTORS: "Does anything seem to cause this pain?" (e.g., foods, stress, alcohol)     denies 9. CARDIAC SYMPTOMS: "Do you have any of the following symptoms: chest pain, difficulty breathing, sweating, nausea?"     denies 10. OTHER SYMPTOMS: "Do you have any other symptoms?" (e.g., back pain, diarrhea, fever, urination pain, vomiting)     1 episode of nausea  Protocols used: Abdominal Pain - Upper-A-AH

## 2023-06-10 NOTE — Progress Notes (Signed)
 ACUTE VISIT Chief Complaint  Patient presents with   Abdominal Pain    Ongoing x 5 months, getting worse. Did vomit last week, lasted hours last Thursday    Arm Pain    Left arm   HPI: [name] is a 38 y.o. female with a PMHx significant for migraine, avascular necrosis of hips, vitamin D  deficiency, and B12 deficeincy, who is here today complaining of sharp abdominal pain.   Patient complains of intermittent epigastric abdominal pain since 12/2022. She says she has it about twice per month, and describes it as a sharp, sudden pain which she rates as a 10/10. In the past, it has not radiated and lasts for 5-10 minutes. The pain is normally relieved by standing and rocking.  However, she states that on 4/24, she had a severe episode that included radiation to her right side and back, as well as nausea and vomiting. She could not relieve this pain with rocking.  She rarely drinks alcohol.  She had a virtual visit on 4/25 and was prescribed omeprazole 20 mg daily.  She has not identified exacerbating factors.  Pertinent negatives include fever, changes in appetite, cough, wheezing, dyspnea, heartburn, difficulty swallowing, changes in bowel habits when having the pain, and associations with food or drink. Lab Results  Component Value Date   NA 137 03/14/2023   CL 105 03/14/2023   K 3.9 03/14/2023   CO2 24 03/14/2023   BUN 14 03/14/2023   CREATININE 0.79 03/14/2023   GFRNONAA >60 03/14/2023   CALCIUM 9.1 03/14/2023   ALBUMIN 3.7 10/12/2022   GLUCOSE 128 (H) 03/14/2023   Lab Results  Component Value Date   WBC 6.2 03/14/2023   HGB 12.1 03/14/2023   HCT 36.2 03/14/2023   MCV 89.6 03/14/2023   PLT 379 03/14/2023   Elbow pain:  Patient also complains of left outer elbow pain for a month which she rates as a 9/10.  Pain is intermittent, exacerbated by certain movements and palpation. She has not noted local edema or erythema. She is right-handed.  Denies recent trauma.   She has been applying topical Biofreeze.  She mentions she had a recent hip replacement.  No longer smoking. Negative for frequent alcohol intake. LMP: 3 weeks ago  Review of Systems  Constitutional:  Negative for activity change, chills and unexpected weight change.  Cardiovascular:  Negative for chest pain and leg swelling.  Endocrine: Negative for cold intolerance and heat intolerance.  Genitourinary:  Negative for decreased urine volume, dysuria and hematuria.  Skin:  Negative for rash.  Neurological:  Negative for syncope, facial asymmetry and weakness.  See other pertinent positives and negatives in HPI.  Current Outpatient Medications on File Prior to Visit  Medication Sig Dispense Refill   alendronate  (FOSAMAX ) 70 MG tablet Take 1 tablet (70 mg total) by mouth every 7 (seven) days. 4 tablet 11   aspirin  EC 325 MG tablet Take 1 tablet (325 mg total) by mouth daily. 14 tablet 0   cyanocobalamin  (VITAMIN B12) 1000 MCG/ML injection Inject 1 ml into the muscle once a week for a month.  Then injected 1 ml once a month thereafter 6 mL 11   ibuprofen  (ADVIL ) 800 MG tablet Take 1 tablet (800 mg total) by mouth every 8 (eight) hours as needed. 30 tablet 5   meloxicam  (MOBIC ) 7.5 MG tablet Take 1 tablet (7.5 mg total) by mouth daily. 30 tablet 0   Norethindrone Acetate-Ethinyl Estrad-FE (HAILEY  24 FE) 1-20 MG-MCG(24) tablet Take  1 tablet by mouth daily. 84 tablet 3   SYRINGE-NEEDLE, DISP, 3 ML (BD SAFETYGLIDE SYRINGE/NEEDLE) 25G X 1" 3 ML MISC Use for B12 injections 100 each 3   No current facility-administered medications on file prior to visit.    Past Medical History:  Diagnosis Date   Migraine    Migraine    No pertinent past medical history    Tobacco abuse    Vertigo    No Known Allergies  Social History   Socioeconomic History   Marital status: Single    Spouse name: Not on file   Number of children: Not on file   Years of education: Not on file   Highest education  level: Master's degree (e.g., MA, MS, MEng, MEd, MSW, MBA)  Occupational History   Not on file  Tobacco Use   Smoking status: Every Day    Current packs/day: 0.50    Average packs/day: 0.5 packs/day for 1.3 years (0.7 ttl pk-yrs)    Types: Cigarettes    Start date: 2024    Last attempt to quit: 09/09/2011   Smokeless tobacco: Former   Tobacco comments:    Recently quit has not smoked in 3 months   Vaping Use   Vaping status: Never Used  Substance and Sexual Activity   Alcohol use: Yes    Comment: "once a weekend"   Drug use: No   Sexual activity: Yes    Birth control/protection: None  Other Topics Concern   Not on file  Social History Narrative   Lives at home with her mother & son   Right handed   Caffeine: 3 cups a week    Social Drivers of Corporate investment banker Strain: Low Risk  (06/12/2023)   Overall Financial Resource Strain (CARDIA)    Difficulty of Paying Living Expenses: Not hard at all  Food Insecurity: No Food Insecurity (06/12/2023)   Hunger Vital Sign    Worried About Running Out of Food in the Last Year: Never true    Ran Out of Food in the Last Year: Never true  Transportation Needs: No Transportation Needs (06/12/2023)   PRAPARE - Administrator, Civil Service (Medical): No    Lack of Transportation (Non-Medical): No  Physical Activity: Unknown (06/12/2023)   Exercise Vital Sign    Days of Exercise per Week: 0 days    Minutes of Exercise per Session: Not on file  Stress: No Stress Concern Present (06/12/2023)   [employer] of Occupational Health - Occupational Stress Questionnaire    Feeling of Stress : Not at all  Social Connections: Moderately Isolated (06/12/2023)   Social Connection and Isolation Panel [NHANES]    Frequency of Communication with Friends and Family: More than three times a week    Frequency of Social Gatherings with Friends and Family: Three times a week    Attends Religious Services: 1 to 4 times per year     Active Member of Clubs or Organizations: No    Attends Banker Meetings: Not on file    Marital Status: Never married    Vitals:   06/13/23 0730  BP: 118/70  Pulse: 100  Resp: 12  Temp: 98.5 F (36.9 C)  SpO2: 97%   Body mass index is 30.11 kg/m.  Physical Exam Vitals and nursing note reviewed.  Constitutional:      General: She is not in acute distress.    Appearance: She is well-developed.  HENT:  Head: Normocephalic and atraumatic.     Mouth/Throat:     Mouth: Mucous membranes are moist.     Pharynx: Oropharynx is clear. Uvula midline.  Eyes:     Conjunctiva/sclera: Conjunctivae normal.  Cardiovascular:     Rate and Rhythm: Normal rate and regular rhythm.     Heart sounds: No murmur heard. Pulmonary:     Effort: Pulmonary effort is normal. No respiratory distress.     Breath sounds: Normal breath sounds.  Abdominal:     Palpations: Abdomen is soft. There is no hepatomegaly or mass.     Tenderness: There is no abdominal tenderness.  Musculoskeletal:     Left elbow: No swelling or deformity. Normal range of motion. Tenderness present in lateral epicondyle (worse with passive supination). No olecranon process tenderness.     Right lower leg: No edema.     Left lower leg: No edema.  Lymphadenopathy:     Cervical: No cervical adenopathy.  Skin:    General: Skin is warm.     Findings: No erythema or rash.  Neurological:     General: No focal deficit present.     Mental Status: She is alert and oriented to person, place, and time.     Comments: Gait assisted with a cane.  Psychiatric:        Mood and Affect: Mood and affect normal.   ASSESSMENT AND PLAN:  Ms. Ference was seen today for sharp abdominal pain.  Lab Results  Component Value Date   ALT 24 06/13/2023   AST 21 06/13/2023   ALKPHOS 65 06/13/2023   BILITOT 0.5 06/13/2023   Lab Results  Component Value Date   LIPASE 52.0 06/13/2023   Lab Results  Component Value Date   NA 137  06/13/2023   CL 104 06/13/2023   K 3.9 06/13/2023   CO2 24 06/13/2023   BUN 9 06/13/2023   CREATININE 0.75 06/13/2023   GFR 99.73 06/13/2023   CALCIUM 9.0 06/13/2023   ALBUMIN 4.0 06/13/2023   GLUCOSE 87 06/13/2023   Epigastric abdominal pain We discussed possible etiologies: Dyspepsia, gallbladder disorder, and less likely pancreatic disorder are some to consider. History and examination today do not suggest a serious process. I do not think imaging is needed at this time. Instructed about warning signs.  -     Comprehensive metabolic panel with GFR; Future -     Lipase; Future  Lateral epicondylitis of left elbow Educated the diagnosis, prognosis, and treatment options. I will obtain elbow imaging is needed at this time, no history of trauma. Continue topical Biofreeze, she can also try local ice. PT will be arranged.  -     Ambulatory referral to Physical Therapy  Dyspepsia She agrees with trial of PPI, omeprazole 40 mg daily 30 minutes before breakfast for 6 to 8 weeks then continue OTC 20 mg and wean off medication. GERD precautions. Recommend trying to identify trigger factors. Instructed about warning signs. Follow-up with PCP in 6 to 8 weeks.  -     Omeprazole; Take 1 capsule (40 mg total) by mouth daily.  Dispense: 60 capsule; Refill: 0  Return in about 8 weeks (around 08/08/2023) for abdominal pain with PCP.  I, Odelia Bender, acting as a scribe for Arif Amendola [place], MD., have documented all relevant documentation on the behalf of Helio Lack [place], MD, as directed by  Raeana Blinn [place], MD while in the presence of Aaleigha Bozza [place], MD.   I, Arvell Pulsifer [place], MD, have reviewed all documentation  for this visit. The documentation on 06/13/23 for the exam, diagnosis, procedures, and orders are all accurate and complete.  Ruey Storer G. [place], MD  [facility]. [place] office.

## 2023-06-10 NOTE — Patient Instructions (Signed)
 Abdominal Pain, Adult  Pain in the abdomen (abdominal pain) can be caused by many things. In most cases, it gets better with no treatment or by being treated at home. But in some cases, it can be serious. Your health care provider will ask questions about your medical history and do a physical exam to try to figure out what is causing your pain. Follow these instructions at home: Medicines Take over-the-counter and prescription medicines only as told by your provider. Do not take medicines that help you poop (laxatives) unless told by your provider. General instructions Watch your condition for any changes. Drink enough fluid to keep your pee (urine) pale yellow. Contact a health care provider if: Your pain changes, gets worse, or lasts longer than expected. You have severe cramping or bloating in your abdomen, or you vomit. Your pain gets worse with meals, after eating, or with certain foods. You are constipated or have diarrhea for more than 2-3 days. You are not hungry, or you lose weight without trying. You have signs of dehydration. These may include: Dark pee, very little pee, or no pee. Cracked lips or dry mouth. Sleepiness or weakness. You have pain when you pee (urinate) or poop. Your abdominal pain wakes you up at night. You have blood in your pee. You have a fever. Get help right away if: You cannot stop vomiting. Your pain is only in one part of the abdomen. Pain on the right side could be caused by appendicitis. You have bloody or black poop (stool), or poop that looks like tar. You have trouble breathing. You have chest pain. These symptoms may be an emergency. Get help right away. Call 911. Do not wait to see if the symptoms will go away. Do not drive yourself to the hospital. This information is not intended to replace advice given to you by your health care provider. Make sure you discuss any questions you have with your health care provider. Document Revised:  11/18/2021 Document Reviewed: 11/18/2021 Elsevier Patient Education  2024 [employer].

## 2023-06-10 NOTE — Progress Notes (Signed)
 Virtual Visit Consent   Ammie Braatz, you are scheduled for a virtual visit with a [facility] provider today. Just as with appointments in the office, your consent must be obtained to participate. Your consent will be active for this visit and any virtual visit you may have with one of our providers in the next 365 days. If you have a MyChart account, a copy of this consent can be sent to you electronically.  As this is a virtual visit, video technology does not allow for your provider to perform a traditional examination. This may limit your provider's ability to fully assess your condition. If your provider identifies any concerns that need to be evaluated in person or the need to arrange testing (such as labs, EKG, etc.), we will make arrangements to do so. Although advances in technology are sophisticated, we cannot ensure that it will always work on either your end or our end. If the connection with a video visit is poor, the visit may have to be switched to a telephone visit. With either a video or telephone visit, we are not always able to ensure that we have a secure connection.  By engaging in this virtual visit, you consent to the provision of healthcare and authorize for your insurance to be billed (if applicable) for the services provided during this visit. Depending on your insurance coverage, you may receive a charge related to this service.  I need to obtain your verbal consent now. Are you willing to proceed with your visit today? Kathleen Powell has provided verbal consent on 06/10/2023 for a virtual visit (video or telephone). Albertha Huger, FNP  Date: 06/10/2023 11:09 AM   Virtual Visit via Video Note   I, Albertha Huger, connected with  Kathleen Powell  ([id number], 28-Dec-1985) on 06/10/23 at 11:15 AM EDT by a video-enabled telemedicine application and verified that I am speaking with the correct person using two identifiers.  Location: Patient: Virtual Visit Location Patient:  Home Provider: Virtual Visit Location Provider: Home Office   I discussed the limitations of evaluation and management by telemedicine and the availability of in person appointments. The patient expressed understanding and agreed to proceed.    History of Present Illness: Kathleen Powell is a 38 y.o. who identifies as a female who was assigned female at birth, and is being seen today for epigastric pain occasionally, Occurring for 5 months. Seen in ED in January with neg cardiac work up. She can not figure out any food causing this. She vomited yesterday but otherwise has had no nausea, vomiting or diarrhea. No fever. No pain today. Aaron Aas  HPI: HPI  Problems:  Patient Active Problem List   Diagnosis Date Noted   Avascular necrosis of bone of left hip (HCC) 12/30/2022   Avascular necrosis of bone of hip, right (HCC) 12/30/2022   Vitamin D  deficiency 10/10/2020   Vitamin B12 deficiency 10/10/2020   Tobacco abuse    Continuous dependence on cigarette smoking 11/01/2018   Chronic migraine without aura without status migrainosus, not intractable 12/29/2017   Migraine with aura and without status migrainosus, not intractable 12/29/2017    Allergies: No Known Allergies Medications:  Current Outpatient Medications:    alendronate  (FOSAMAX ) 70 MG tablet, Take 1 tablet (70 mg total) by mouth every 7 (seven) days., Disp: 4 tablet, Rfl: 11   aspirin  EC 325 MG tablet, Take 1 tablet (325 mg total) by mouth daily., Disp: 14 tablet, Rfl: 0   celecoxib  (CELEBREX ) 200 MG capsule, Take 1 capsule (  200 mg total) by mouth 2 (two) times daily., Disp: 20 capsule, Rfl: 0   cyanocobalamin  (VITAMIN B12) 1000 MCG/ML injection, Inject 1 ml into the muscle once a week for a month.  Then injected 1 ml once a month thereafter, Disp: 6 mL, Rfl: 11   HYDROcodone -acetaminophen  (NORCO) 5-325 MG tablet, Take 1-2 tablets by mouth every 6 (six) hours as needed., Disp: 12 tablet, Rfl: 0   ibuprofen  (ADVIL ) 800 MG tablet, Take 1  tablet (800 mg total) by mouth every 8 (eight) hours as needed., Disp: 30 tablet, Rfl: 5   meloxicam  (MOBIC ) 7.5 MG tablet, Take 1 tablet (7.5 mg total) by mouth daily., Disp: 30 tablet, Rfl: 0   Norethindrone Acetate-Ethinyl Estrad-FE (HAILEY  24 FE) 1-20 MG-MCG(24) tablet, Take 1 tablet by mouth daily., Disp: 84 tablet, Rfl: 3   oxyCODONE  (ROXICODONE ) 5 MG immediate release tablet, Take 1 tablet (5 mg total) by mouth every 4 (four) hours as needed for severe pain (pain score 7-10) or breakthrough pain., Disp: 10 tablet, Rfl: 0   SYRINGE-NEEDLE, DISP, 3 ML (BD SAFETYGLIDE SYRINGE/NEEDLE) 25G X 1" 3 ML MISC, Use for B12 injections, Disp: 100 each, Rfl: 3  Observations/Objective: Patient is well-developed, well-nourished in no acute distress.  Resting comfortably  at home.  Head is normocephalic, atraumatic.  No labored breathing.  Speech is clear and coherent with logical content.  Patient is alert and oriented at baseline.    Assessment and Plan: 1. Epigastric pain (Primary)  Increase water intake, start omeprazole otc, make apptmt with pcp for labs and possible referral to GI. ED for severe pain.   Follow Up Instructions: I discussed the assessment and treatment plan with the patient. The patient was provided an opportunity to ask questions and all were answered. The patient agreed with the plan and demonstrated an understanding of the instructions.  A copy of instructions were sent to the patient via MyChart unless otherwise noted below.     The patient was advised to call back or seek an in-person evaluation if the symptoms worsen or if the condition fails to improve as anticipated.    Shahzaib Azevedo, FNP

## 2023-06-13 ENCOUNTER — Ambulatory Visit: Admitting: Family Medicine

## 2023-06-13 ENCOUNTER — Encounter: Payer: Self-pay | Admitting: Family Medicine

## 2023-06-13 VITALS — BP 118/70 | HR 100 | Temp 98.5°F | Resp 12 | Ht 63.0 in | Wt 170.0 lb

## 2023-06-13 DIAGNOSIS — R1013 Epigastric pain: Secondary | ICD-10-CM

## 2023-06-13 DIAGNOSIS — M7712 Lateral epicondylitis, left elbow: Secondary | ICD-10-CM | POA: Diagnosis not present

## 2023-06-13 LAB — LIPASE: Lipase: 52 U/L (ref 11.0–59.0)

## 2023-06-13 LAB — COMPREHENSIVE METABOLIC PANEL WITH GFR
ALT: 24 U/L (ref 0–35)
AST: 21 U/L (ref 0–37)
Albumin: 4 g/dL (ref 3.5–5.2)
Alkaline Phosphatase: 65 U/L (ref 39–117)
BUN: 9 mg/dL (ref 6–23)
CO2: 24 meq/L (ref 19–32)
Calcium: 9 mg/dL (ref 8.4–10.5)
Chloride: 104 meq/L (ref 96–112)
Creatinine, Ser: 0.75 mg/dL (ref 0.40–1.20)
GFR: 99.73 mL/min (ref >60.00)
Glucose, Bld: 87 mg/dL (ref 70–99)
Potassium: 3.9 meq/L (ref 3.5–5.1)
Sodium: 137 meq/L (ref 135–145)
Total Bilirubin: 0.5 mg/dL (ref 0.2–1.2)
Total Protein: 7.4 g/dL (ref 6.0–8.3)

## 2023-06-13 MED ORDER — OMEPRAZOLE 40 MG PO CPDR: 40.0000 mg | DELAYED_RELEASE_CAPSULE | Freq: Every day | ORAL | 0 refills | Status: DC

## 2023-06-13 NOTE — Patient Instructions (Addendum)
 A few things to remember from today's visit:  Epigastric abdominal pain - Plan: Comprehensive metabolic panel with GFR, Lipase  Lateral epicondylitis of left elbow - Plan: Ambulatory referral to Physical Therapy  Dyspepsia - Plan: omeprazole (PRILOSEC) 40 MG capsule  Take Omeprazole 40 mg daily 30 min before breakfast for 6-8 weeks then continue with over the counter 20 mg to wean it off.  For elbow local ice and continue Biofreeze. PT will be arranged.  Do not use My Chart to request refills or for acute issues that need immediate attention. If you send a my chart message, it may take a few days to be addressed, specially if I am not in the office.  Please be sure medication list is accurate. If a new problem present, please set up appointment sooner than planned today.

## 2023-06-16 ENCOUNTER — Ambulatory Visit: Admitting: Internal Medicine

## 2023-06-24 ENCOUNTER — Ambulatory Visit

## 2023-09-06 NOTE — Progress Notes (Unsigned)
 ANNUAL EXAM Patient name: Kathleen Powell MRN [id number]  Date of birth: 1986-01-27 Chief Complaint:   No chief complaint on file.  History of Present Illness:   Kathleen Powell is a 38 y.o. G34P1001 {race:25618} female being seen today for a routine annual exam.  Current complaints: ***  No LMP recorded. (Menstrual status: Oral contraceptives).   The pregnancy intention screening data noted above was reviewed. Potential methods of contraception were discussed. The patient elected to proceed with No data recorded.   Last pap 05/18/22 NILM HPV neg, 05/15/21 NILM, 04/18/20 NILM HPV neg. H/O abnormal pap: {yes/yes***/no:23866} Last mammogram: Never previously done. Results were: {normal, abnormal, n/a:23837}. Family h/o breast cancer: {yes***/no:23838} Last colonoscopy: Never previously done due to age. Results were: {normal, abnormal, n/a:23837}. Family h/o colorectal cancer: {yes***/no:23838} STI screening: Contraception: OCP     11/04/2022    7:02 AM 10/12/2022    2:50 PM 08/31/2022    9:00 AM 05/21/2022    9:12 AM 05/18/2022    8:33 AM  Depression screen PHQ 2/9  Decreased Interest 0 0 0 0 0  Down, Depressed, Hopeless 0 0 0 0 0  PHQ - 2 Score 0 0 0 0 0  Altered sleeping 1 0 0 0 0  Tired, decreased energy 0 0 0 0 0  Change in appetite 0 0 0 0 0  Feeling bad or failure about yourself  0 0 0 0 0  Trouble concentrating 0 0 0 0 0  Moving slowly or fidgety/restless 0 0 0 0 0  Suicidal thoughts 0 0 0 0 0  PHQ-9 Score 1 0 0 0 0  Difficult doing work/chores Not difficult at all  Not difficult at all Not difficult at all Not difficult at all        11/04/2022    7:03 AM 08/31/2022    9:00 AM 05/21/2022    9:14 AM 05/21/2022    9:12 AM  GAD 7 : Generalized Anxiety Score  Nervous, Anxious, on Edge 0 0 0 0  Control/stop worrying 0 0 0 0  Worry too much - different things 0 0 0 0  Trouble relaxing 0 0 0 0  Restless 0 0 0 0  Easily annoyed or irritable 0 0 0 0  Afraid - awful might happen 0 0  0 0  Total GAD 7 Score 0 0 0 0  Anxiety Difficulty Not difficult at all Not difficult at all Not difficult at all Not difficult at all     Review of Systems:   Pertinent items are noted in HPI Denies any headaches, blurred vision, fatigue, shortness of breath, chest pain, abdominal pain, abnormal vaginal discharge/itching/odor/irritation, problems with periods, bowel movements, urination, or intercourse unless otherwise stated above. Pertinent History Reviewed:  Reviewed past medical,surgical, social and family history.  Reviewed problem list, medications and allergies. Physical Assessment:  There were no vitals filed for this visit.There is no height or weight on file to calculate BMI.        Physical Examination:   General appearance - well appearing, and in no distress  Mental status - alert, oriented to person, place, and time  Psych:  She has a normal mood and affect  Skin - warm and dry, normal color, no suspicious lesions noted  Chest - effort normal, all lung fields clear to auscultation bilaterally  Heart - normal rate and regular rhythm  Neck:  midline trachea, no thyromegaly or nodules  Breasts - breasts appear normal, no suspicious masses, no  skin or nipple changes or  axillary nodes  Abdomen - soft, nontender, nondistended, no masses or organomegaly  Pelvic - VULVA: normal appearing vulva with no masses, tenderness or lesions  VAGINA: normal appearing vagina with normal color and discharge, no lesions  CERVIX: normal appearing cervix without discharge or lesions, no CMT  Thin prep pap is {Desc; done/not:10129} *** HR HPV cotesting  UTERUS: uterus is felt to be normal size, shape, consistency and nontender   ADNEXA: No adnexal masses or tenderness noted.  Rectal - normal rectal, good sphincter tone, no masses felt. Hemoccult: ***  Extremities:  No swelling or varicosities noted  Chaperone present for exam  No results found for this or any previous visit (from the past 24  hours).  Assessment & Plan:    - Cervical cancer screening: Discussed guidelines. Pap with HPV *** - Gardasil: {Blank single:19197::***,has not yet had. Will provide information,completed,has not yet had. Counseling provided and she declines,Has not yet had. Counseling provided and pt accepts} - STD Testing: {Blank single:19197::accepts,declines,not indicated} - Birth Control: Discussed options and their risks, benefits and common side effects; discussed VTE with estrogen containing options. Desires: {Birth control type:23956} - Breast Health: Encouraged self breast awareness/SBE. Teaching provided. Discussed limits of clinical breast exam for detecting breast cancer. {Blank single:19197::Rx given for [facility] is up to date: ***} - Mammogram: {Mammo f/u:25212::@ 40yo}, or sooner if problems - Colonoscopy: {TCS f/u:25213::@ 40yo}, or sooner if problems - F/U 12 months and prn   No orders of the defined types were placed in this encounter.   Meds: No orders of the defined types were placed in this encounter.   Follow-up: No follow-ups on file.  [name] E [name], [place] 09/06/2023 5:10 PM

## 2023-09-09 ENCOUNTER — Encounter: Payer: Self-pay | Admitting: Physician Assistant

## 2023-09-09 ENCOUNTER — Ambulatory Visit: Admitting: Physician Assistant

## 2023-09-09 ENCOUNTER — Other Ambulatory Visit (HOSPITAL_COMMUNITY)
Admission: RE | Admit: 2023-09-09 | Discharge: 2023-09-09 | Disposition: A | Discharge status: Home / Self Care | Source: Ambulatory Visit | Attending: Physician Assistant | Admitting: Physician Assistant

## 2023-09-09 VITALS — BP 136/86 | HR 89 | Ht 63.0 in | Wt 180.0 lb

## 2023-09-09 DIAGNOSIS — Z3041 Encounter for surveillance of contraceptive pills: Secondary | ICD-10-CM | POA: Diagnosis not present

## 2023-09-09 DIAGNOSIS — Z01419 Encounter for gynecological examination (general) (routine) without abnormal findings: Secondary | ICD-10-CM

## 2023-09-09 DIAGNOSIS — Z1231 Encounter for screening mammogram for malignant neoplasm of breast: Secondary | ICD-10-CM | POA: Diagnosis not present

## 2023-09-09 DIAGNOSIS — Z124 Encounter for screening for malignant neoplasm of cervix: Secondary | ICD-10-CM

## 2023-09-09 MED ORDER — HAILEY 24 FE 1-20 MG-MCG(24) PO TABS: 1.0000 | ORAL_TABLET | Freq: Every day | ORAL | 3 refills | Status: DC

## 2023-09-09 NOTE — Progress Notes (Signed)
 Pt presents for AEX Last PAP 05/2022 Requesting PAP and STD testing

## 2023-09-10 LAB — HIV ANTIBODY (ROUTINE TESTING W REFLEX): HIV Screen 4th Generation wRfx: NONREACTIVE

## 2023-09-10 LAB — HEPATITIS C ANTIBODY: Hep C Virus Ab: NONREACTIVE

## 2023-09-10 LAB — HEPATITIS B SURFACE ANTIGEN: Hepatitis B Surface Ag: NEGATIVE

## 2023-09-10 LAB — RPR: RPR Ser Ql: NONREACTIVE

## 2023-09-12 LAB — CERVICOVAGINAL ANCILLARY ONLY
Chlamydia: NEGATIVE
Comment: NEGATIVE
Comment: NEGATIVE
Comment: NORMAL
Neisseria Gonorrhea: NEGATIVE
Trichomonas: NEGATIVE

## 2023-09-18 ENCOUNTER — Ambulatory Visit: Payer: Self-pay | Admitting: Physician Assistant

## 2023-09-19 ENCOUNTER — Encounter (HOSPITAL_BASED_OUTPATIENT_CLINIC_OR_DEPARTMENT_OTHER): Payer: Self-pay | Admitting: Radiology

## 2023-09-19 ENCOUNTER — Ambulatory Visit (HOSPITAL_BASED_OUTPATIENT_CLINIC_OR_DEPARTMENT_OTHER)
Admission: RE | Admit: 2023-09-19 | Discharge: 2023-09-19 | Disposition: A | Discharge status: Home / Self Care | Source: Ambulatory Visit | Attending: Physician Assistant | Admitting: Physician Assistant

## 2023-09-19 DIAGNOSIS — Z1231 Encounter for screening mammogram for malignant neoplasm of breast: Secondary | ICD-10-CM | POA: Diagnosis not present

## 2023-09-19 DIAGNOSIS — Z01419 Encounter for gynecological examination (general) (routine) without abnormal findings: Secondary | ICD-10-CM | POA: Diagnosis present

## 2023-09-22 ENCOUNTER — Other Ambulatory Visit: Payer: Self-pay | Admitting: Physician Assistant

## 2023-09-22 DIAGNOSIS — R928 Other abnormal and inconclusive findings on diagnostic imaging of breast: Secondary | ICD-10-CM

## 2023-09-28 ENCOUNTER — Ambulatory Visit: Admitting: Physician Assistant

## 2023-10-07 ENCOUNTER — Ambulatory Visit
Admission: RE | Admit: 2023-10-07 | Discharge: 2023-10-07 | Disposition: A | Discharge status: Home / Self Care | Source: Ambulatory Visit | Attending: Physician Assistant | Admitting: Physician Assistant

## 2023-10-07 ENCOUNTER — Ambulatory Visit

## 2023-10-07 DIAGNOSIS — R928 Other abnormal and inconclusive findings on diagnostic imaging of breast: Secondary | ICD-10-CM

## 2023-10-10 ENCOUNTER — Ambulatory Visit: Admitting: Physician Assistant

## 2023-10-18 ENCOUNTER — Encounter: Payer: Self-pay | Admitting: Sports Medicine

## 2023-11-03 ENCOUNTER — Ambulatory Visit: Admitting: Physician Assistant

## 2023-12-19 ENCOUNTER — Encounter: Payer: Self-pay | Admitting: Radiology

## 2024-01-16 ENCOUNTER — Ambulatory Visit: Admitting: Obstetrics

## 2024-01-16 ENCOUNTER — Encounter: Payer: Self-pay | Admitting: Obstetrics

## 2024-01-16 ENCOUNTER — Other Ambulatory Visit (HOSPITAL_COMMUNITY)
Admission: RE | Admit: 2024-01-16 | Discharge: 2024-01-16 | Disposition: A | Source: Ambulatory Visit | Attending: Obstetrics | Admitting: Obstetrics

## 2024-01-16 VITALS — BP 137/83 | HR 95 | Ht 63.0 in | Wt 175.3 lb

## 2024-01-16 DIAGNOSIS — Z01419 Encounter for gynecological examination (general) (routine) without abnormal findings: Secondary | ICD-10-CM | POA: Insufficient documentation

## 2024-01-16 DIAGNOSIS — N946 Dysmenorrhea, unspecified: Secondary | ICD-10-CM

## 2024-01-16 DIAGNOSIS — Z3041 Encounter for surveillance of contraceptive pills: Secondary | ICD-10-CM

## 2024-01-16 DIAGNOSIS — N939 Abnormal uterine and vaginal bleeding, unspecified: Secondary | ICD-10-CM | POA: Diagnosis not present

## 2024-01-16 MED ORDER — IBUPROFEN 800 MG PO TABS: 800.0000 mg | ORAL_TABLET | Freq: Three times a day (TID) | ORAL | 5 refills | Status: AC | PRN

## 2024-01-16 NOTE — Progress Notes (Signed)
 Subjective:        Kathleen Powell is a 38 y.o. female here for a routine exam.  Current complaints: No period for the past 2 months.  Severe cramping for the past 2-3 months..    Personal health questionnaire:  Is patient [name], have a family history of breast and/or ovarian cancer: no Is there a family history of uterine cancer diagnosed at age < 59, gastrointestinal cancer, urinary tract cancer, family member who is a Personnel Officer syndrome-associated carrier: yes Is the patient overweight and hypertensive, family history of diabetes, personal history of gestational diabetes, preeclampsia or PCOS: yes Is patient over 68, have PCOS,  family history of premature CHD under age 77, diabetes, smoke, have hypertension or peripheral artery disease:  no At any time, has a partner hit, kicked or otherwise hurt or frightened you?: no Over the past 2 weeks, have you felt down, depressed or hopeless?: no Over the past 2 weeks, have you felt little interest or pleasure in doing things?:no   Gynecologic History No LMP recorded (lmp unknown). (Menstrual status: Oral contraceptives). Contraception: OCP (estrogen/progesterone) Last Pap: 2024. Results were: normal Last mammogram: 2025. Results were: normal  Obstetric History OB History  Gravida Para Term Preterm AB Living  1 1 1   1   SAB IAB Ectopic Multiple Live Births      1    # Outcome Date GA Lbr Len/2nd Weight Sex Type Anes PTL Lv  1 Term 11/27/11 [redacted]w[redacted]d  6 lb 3.3 oz (2.815 kg) M CS-LTranv EPI  LIV    Past Medical History:  Diagnosis Date   AVN (avascular necrosis of bone) (HCC)    Migraine    Migraine    No pertinent past medical history    Tobacco abuse    Vertigo     Past Surgical History:  Procedure Laterality Date   CESAREAN SECTION  11/27/2011   Procedure: CESAREAN SECTION;  Surgeon: Aida DELENA Na, MD;  Location: [facility];  Service: Obstetrics;  Laterality: N/A;  Primary cesarean section with delivery of baby boy  at 64. Apgars 8/9.   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 (Patient not taking: Reported on 09/09/2023), Disp: 4 tablet, Rfl: 11   aspirin  EC 325 MG tablet, Take 1 tablet (325 mg total) by mouth daily. (Patient not taking: Reported on 09/09/2023), Disp: 14 tablet, Rfl: 0   cyanocobalamin  (VITAMIN B12) 1000 MCG/ML injection, Inject 1 ml into the muscle once a week for a month.  Then injected 1 ml once a month thereafter (Patient not taking: Reported on 09/09/2023), Disp: 6 mL, Rfl: 11   ibuprofen  (ADVIL ) 800 MG tablet, Take 1 tablet (800 mg total) by mouth every 8 (eight) hours as needed., Disp: 30 tablet, Rfl: 5   meloxicam  (MOBIC ) 7.5 MG tablet, Take 1 tablet (7.5 mg total) by mouth daily. (Patient not taking: Reported on 09/09/2023), Disp: 30 tablet, Rfl: 0   omeprazole  (PRILOSEC) 40 MG capsule, Take 1 capsule (40 mg total) by mouth daily. (Patient not taking: Reported on 09/09/2023), Disp: 60 capsule, Rfl: 0   SYRINGE-NEEDLE, DISP, 3 ML (BD SAFETYGLIDE SYRINGE/NEEDLE) 25G X 1 3 ML MISC, Use for B12 injections, Disp: 100 each, Rfl: 3 No Known  Allergies  Social History   Tobacco Use   Smoking status: Former    Current packs/day: 0.50    Average packs/day: 0.5 packs/day for 1.9 years (1.0 ttl pk-yrs)    Types: Cigarettes    Start date: 2024    Quit date: 09/09/2011   Smokeless tobacco: Former   Tobacco comments:    Recently quit has not smoked in 3 months   Substance Use Topics   Alcohol use: Yes    Comment: once a weekend    Family History  Problem Relation Age of Onset   Hypertension Mother    Cancer Mother        thyroid    Cataracts Mother    Heart disease Father        pace maker   Kidney failure Father     Asthma Sister    Heart disease Maternal Grandmother        congestive heart failure   Hypertension Maternal Grandmother    Heart attack Maternal Grandmother    Gout Maternal Grandfather    Colon cancer Paternal Grandfather    Colon cancer Paternal Uncle    Anesthesia problems Neg Hx       Review of Systems  Constitutional: negative for fatigue and weight loss Respiratory: negative for cough and wheezing Cardiovascular: negative for chest pain, fatigue and palpitations Gastrointestinal: negative for abdominal pain and change in bowel habits Musculoskeletal:negative for myalgias Neurological: negative for gait problems and tremors Behavioral/Psych: negative for abusive relationship, depression Endocrine: negative for temperature intolerance    Genitourinary: positive for abnormal menstrual periods and severe cramping. Negative for genital lesions, hot flashes, sexual problems and vaginal discharge Integument/breast: negative for breast lump, breast tenderness, nipple discharge and skin lesion(s)    Objective:       BP 137/83   Pulse 95   Ht 5' 3 (1.6 m)   Wt 175 lb 4.8 oz (79.5 kg)   LMP  (LMP Unknown)   BMI 31.05 kg/m  General:   Alert and no distress  Skin:   no rash or abnormalities  Lungs:   clear to auscultation bilaterally  Heart:   regular rate and rhythm, S1, S2 normal, no murmur, click, rub or gallop  Breasts:   normal without suspicious masses, skin or nipple changes or axillary nodes  Abdomen:  normal findings: no organomegaly, soft, non-tender and no hernia  Pelvis:  External genitalia: normal general appearance Urinary system: urethral meatus normal and bladder without fullness, nontender Vaginal: normal without tenderness, induration or masses Cervix: normal appearance Adnexa: normal bimanual exam Uterus: anteverted and non-tender, normal size   Lab Review Urine pregnancy test Labs reviewed yes Radiologic studies reviewed yes  I have spent a total of  20 minutes of face-to-face time, excluding clinical staff time, reviewing notes and preparing to see patient, ordering tests and/or medications, and counseling the patient.   Assessment:    1. Encounter for gynecological examination with Papanicolaou smear of cervix (Primary) Rx: - Cytology - PAP( [place])  2. Abnormal uterine bleeding (AUB) Rx: - US  PELVIC COMPLETE WITH TRANSVAGINAL; Future  3. Dysmenorrhea Rx: - ibuprofen  (ADVIL ) 800 MG tablet; Take 1 tablet (800 mg total) by mouth every 8 (eight) hours as needed.  Dispense: 30 tablet; Refill: 5  5. Encounter for surveillance of contraceptive pills - continue OCP's     Plan:    Education reviewed: calcium supplements, depression evaluation, low fat, low cholesterol diet, safe sex/STD prevention, self breast exams, and weight bearing exercise.  Contraception: OCP (estrogen/progesterone). Follow up in: 3 weeks.   Meds ordered this encounter  Medications   ibuprofen  (ADVIL ) 800 MG tablet    Sig: Take 1 tablet (800 mg total) by mouth every 8 (eight) hours as needed.    Dispense:  30 tablet    Refill:  5   Orders Placed This Encounter  Procedures   US  PELVIC COMPLETE WITH TRANSVAGINAL    Standing Status:   Future    Expiration Date:   03/18/2025    Reason for Exam (SYMPTOM  OR DIAGNOSIS REQUIRED):   AUB    Preferred imaging location?:   [facility]    CARLIN RONAL CENTERS, MD, FACOG Attending Obstetrician & Gynecologist, [facility] for [facility], [facility], [place] 01/16/2024

## 2024-01-16 NOTE — Progress Notes (Signed)
 Pt presents for extreme sharp cramps right before her period. First noticed it in Feb. Pt has not had a period on 2 months.  No other questions or concerns at this time. Declines pap.

## 2024-01-17 LAB — CYTOLOGY - PAP
Comment: NEGATIVE
Diagnosis: NEGATIVE
High risk HPV: NEGATIVE

## 2024-01-19 ENCOUNTER — Encounter: Admitting: Internal Medicine

## 2024-01-23 ENCOUNTER — Ambulatory Visit (HOSPITAL_COMMUNITY): Admission: RE | Admit: 2024-01-23 | Discharge: 2024-01-23 | Attending: Obstetrics | Admitting: Obstetrics

## 2024-01-23 DIAGNOSIS — N939 Abnormal uterine and vaginal bleeding, unspecified: Secondary | ICD-10-CM

## 2024-02-06 ENCOUNTER — Telehealth: Admitting: Obstetrics

## 2024-02-06 VITALS — Ht 63.0 in | Wt 180.0 lb

## 2024-02-06 DIAGNOSIS — Z6834 Body mass index (BMI) 34.0-34.9, adult: Secondary | ICD-10-CM

## 2024-02-06 DIAGNOSIS — N946 Dysmenorrhea, unspecified: Secondary | ICD-10-CM

## 2024-02-06 DIAGNOSIS — N912 Amenorrhea, unspecified: Secondary | ICD-10-CM

## 2024-02-06 DIAGNOSIS — E669 Obesity, unspecified: Secondary | ICD-10-CM | POA: Diagnosis not present

## 2024-02-06 DIAGNOSIS — G43709 Chronic migraine without aura, not intractable, without status migrainosus: Secondary | ICD-10-CM | POA: Diagnosis not present

## 2024-02-06 DIAGNOSIS — E66811 Obesity, class 1: Secondary | ICD-10-CM

## 2024-02-06 NOTE — Progress Notes (Signed)
 "   GYNECOLOGY VIRTUAL VISIT ENCOUNTER NOTE  Provider location: [facility]   Patient location: Home  I connected with Kathleen Powell on 02/06/2024 at 10:55 AM EST by MyChart Video Encounter and verified that I am speaking with the correct person using two identifiers.   I discussed the limitations, risks, security and privacy concerns of performing an evaluation and management service virtually and the availability of in person appointments. I also discussed with the patient that there may be a patient responsible charge related to this service. The patient expressed understanding and agreed to proceed.   History:  Kathleen Powell is a 38 y.o. G38P1001 female being evaluated today for no period for the past 3 months, and severe cramping. She denies any abnormal vaginal discharge, or other concerns.  She is taking OCP's for contraception     Past Medical History:  Diagnosis Date   AVN (avascular necrosis of bone) (HCC)    Migraine    Migraine    No pertinent past medical history    Tobacco abuse    Vertigo    Past Surgical History:  Procedure Laterality Date   CESAREAN SECTION  11/27/2011   Procedure: CESAREAN SECTION;  Surgeon: Aida DELENA Na, MD;  Location: [facility];  Service: Obstetrics;  Laterality: N/A;  Primary cesarean section with delivery of baby boy at 57. Apgars 8/9.   TOTAL HIP ARTHROPLASTY Left 04/2023   The following portions of the patient's history were reviewed and updated as appropriate: allergies, current medications, past family history, past medical history, past social history, past surgical history and problem list.   Health Maintenance:  Normal pap and negative HRHPV on 01-16-2024.  Normal mammogram on 10-07-2023.   Review of Systems:  Pertinent items noted in HPI and remainder of comprehensive ROS otherwise negative.  Physical Exam:   General:  Alert, oriented and cooperative. Patient appears to be in no acute distress.   Mental Status: Normal mood and affect. Normal behavior. Normal judgment and thought content.   Respiratory: Normal respiratory effort, no problems with respiration noted  Rest of physical exam deferred due to type of encounter  Labs and Imaging No results found for this or any previous visit (from the past 2 weeks). US  PELVIC COMPLETE WITH TRANSVAGINAL Result Date: 01/27/2024 CLINICAL DATA:  Abnormal uterine bleeding EXAM: TRANSABDOMINAL ULTRASOUND OF PELVIS DOPPLER ULTRASOUND OF OVARIES TECHNIQUE: Transabdominal ultrasound examination of the pelvis was performed including evaluation of the uterus, ovaries, adnexal regions, and pelvic cul-de-sac. Color and duplex Doppler ultrasound was utilized to evaluate blood flow to the ovaries. Both transabdominal and transvaginal ultrasound examinations of the pelvis were performed. Transabdominal technique was performed for global imaging of the pelvis including uterus, ovaries, adnexal regions, and pelvic cul-de-sac. It was necessary to proceed with endovaginal exam following the transabdominal exam to visualize the uterus and adnexa. COMPARISON:  None Available. FINDINGS: Uterus Measurements: 7.5 x 4.2 x 5.1 cm = volume: 84 mL. No fibroids or other mass visualized. Endometrium Endometrial cavity distended up to 28 mm with heterogenous fluid containing echodense particles and septations. Right ovary Measurements: 1.7 x 2 x 1.1 cm = volume: 2.0 mL. Normal appearance/no adnexal mass. Doppler: There is normal vascularity on color doppler examination. Spectral doppler arterial and venous waveforms are normal. Left ovary Measurements: 1.9 x 1 x 2 cm = volume: 1.8 mL. Normal appearance/no adnexal mass. Doppler: There is normal vascularity on color doppler examination. Spectral doppler arterial and venous waveforms are normal. Other: No  free fluid IMPRESSION: 1. Endometrial cavity distended up to 28 mm with heterogenous fluid containing echodense particles and septations.  Findings could be secondary to endometrial blood products or other complex fluid, given degree of cavity distension, an obstructing process or lesion at the lower uterine segment/cervix is possible. 2. Unremarkable appearance of the ovaries. Electronically Signed   By: Luke Bun M.D.   On: 01/27/2024 00:12       Assessment and Plan:     1. Amenorrhea, unspecified (Primary) - also has a distended endometrial cavity with a complex aseptate echodense fluid  2. Severe dysmenorrhea  3. Chronic migraine without aura without status migrainosus, not intractable  4. Obesity (BMI 30.0-34.9) -      I discussed the assessment and treatment plan with the patient. The patient was provided an opportunity to ask questions and all were answered. The patient agreed with the plan and demonstrated an understanding of the instructions.   The patient was advised to call back or seek an in-person evaluation/go to the ED if the symptoms worsen or if the condition fails to improve as anticipated.  I provided 10 minutes of non face-to-face time during this encounter. I also spent 5 minutes dedicated to the care of this patient including pre-visit review of records, post visit ordering of medications and appropriate tests or procedures, coordinating care and documenting this visit encounter.    Carlin Centers, MD, [facility] for [facility], [facility], [place] 02/06/2024 "

## 2024-02-06 NOTE — Progress Notes (Signed)
 Pt presents for f/u. Pt has no questions or concerns at this time.

## 2024-02-15 ENCOUNTER — Other Ambulatory Visit: Payer: Self-pay

## 2024-02-15 ENCOUNTER — Other Ambulatory Visit: Payer: Self-pay | Admitting: Obstetrics

## 2024-02-15 DIAGNOSIS — Z3041 Encounter for surveillance of contraceptive pills: Secondary | ICD-10-CM

## 2024-02-15 MED ORDER — HAILEY 24 FE 1-20 MG-MCG(24) PO TABS: 1.0000 | ORAL_TABLET | Freq: Every day | ORAL | 3 refills | Status: AC

## 2024-02-15 NOTE — Progress Notes (Signed)
 Pt called for [facility] refill, last annual/pap 121/25. Sent per protocol

## 2024-03-12 ENCOUNTER — Ambulatory Visit: Payer: Self-pay | Admitting: Obstetrics and Gynecology

## 2024-03-16 ENCOUNTER — Telehealth: Admitting: Obstetrics and Gynecology

## 2024-03-16 DIAGNOSIS — N912 Amenorrhea, unspecified: Secondary | ICD-10-CM

## 2024-03-16 DIAGNOSIS — R9389 Abnormal findings on diagnostic imaging of other specified body structures: Secondary | ICD-10-CM

## 2024-03-16 NOTE — Progress Notes (Signed)
 "   GYNECOLOGY VIRTUAL VISIT ENCOUNTER NOTE  Provider location: [facility] at office   Patient location: Home  I connected with Kathleen Powell on 03/16/24 at  8:55 AM EST by MyChart Video Encounter and verified that I am speaking with the correct person using two identifiers.   I discussed the limitations, risks, security and privacy concerns of performing an evaluation and management service virtually and the availability of in person appointments. I also discussed with the patient that there may be a patient responsible charge related to this service. The patient expressed understanding and agreed to proceed.   History:  Kathleen Powell is a 39 y.o. G27P1001 female being evaluated today for discussion of procedure for abnormal US  finding. Last cycle was in maybe August, has not gone this long without a cycle in the past - at most would miss a month and that was typically due to birth control use. Dr. Rudy suggested having a D&C due to the thickened lining noted on ultrasound. Pain started last year when having cycles without prior hx of dysmenorrhea (mom and sister have dysmenorrhea) and pain has continued without the actual menses. Pain would start a week prior to menstrual onset. Had a hip replacement last year but that was the only surgery, no recent uterine procedure. Last march was the surgery without complications. Has been on the birth control since dysmenorrhea started.  Was on birth control before but didn't have any pain before and therefore not taking anything for pain at that time.  Unclear etiology of avascular necrosis of the hip     Past Medical History:  Diagnosis Date   AVN (avascular necrosis of bone) (HCC)    Migraine    Migraine    No pertinent past medical history    Tobacco abuse    Vertigo    Past Surgical History:  Procedure Laterality Date   CESAREAN SECTION  11/27/2011   Procedure: CESAREAN SECTION;  Surgeon: Aida DELENA Na, MD;   Location: [facility];  Service: Obstetrics;  Laterality: N/A;  Primary cesarean section with delivery of baby boy at 34. Apgars 8/9.   TOTAL HIP ARTHROPLASTY Left 04/2023   The following portions of the patient's history were reviewed and updated as appropriate: allergies, current medications, past family history, past medical history, past social history, past surgical history and problem list.   Health Maintenance:  Normal pap and negative HRHPV on 01/16/24 NILM, HPV negative.  Normal mammogram on 10/07/23 BIRADS 1.   Review of Systems:  Pertinent items noted in HPI and remainder of comprehensive ROS otherwise negative.  Physical Exam:   General:  Alert, oriented and cooperative. Patient appears to be in no acute distress.  Mental Status: Normal mood and affect. Normal behavior. Normal judgment and thought content.   Respiratory: Normal respiratory effort, no problems with respiration noted  Rest of physical exam deferred due to type of encounter  Labs and Imaging Pelvic US  01/23/24 IMPRESSION: 1. Endometrial cavity distended up to 28 mm with heterogenous fluid containing echodense particles and septations. Findings could be secondary to endometrial blood products or other complex fluid, given degree of cavity distension, an obstructing process or lesion at the lower uterine segment/cervix is possible. 2. Unremarkable appearance of the ovaries.   Assessment and Plan:     1. Amenorrhea, unspecified (Primary) 2. Thickened endometrium Unclear etiology of new onset amenorrhea with thickened endometrium. Discussed endometrial sampling via D&C and hysteroscopy. Offered IUD insertion, will contact provider if that is  desired at that time. Noted that amenorrhea can be due to multiple causes but given the thickened lining with continued dysmenorrhea, not likely early onset menopause.  - Ambulatory Referral For Surgery Scheduling  Patient desires surgical management with hysteroscopy, D&C.  The  risks of surgery were discussed in detail with the patient including but not limited to: bleeding which may require transfusion or reoperation; infection which may require prolonged hospitalization or re-hospitalization and antibiotic therapy; injury to bowel, bladder, ureters and major vessels or other surrounding organs which may lead to other procedures; formation of adhesions; need for additional procedures including laparotomy or subsequent procedures secondary to intraoperative injury or abnormal pathology; thromboembolic phenomenon; incisional problems and other postoperative or anesthesia complications.  Patient was told that the likelihood that her condition and symptoms will be treated effectively with this surgical management was moderate to high; the postoperative expectations were also discussed in detail. The patient also understands the alternative treatment options which were discussed in full. All questions were answered.  She was told that she will be contacted by our surgical scheduler regarding the time and date of her surgery; routine preoperative instructions will be given to her by the preoperative nursing team.        I discussed the assessment and treatment plan with the patient. The patient was provided an opportunity to ask questions and all were answered. The patient agreed with the plan and demonstrated an understanding of the instructions.   The patient was advised to call back or seek an in-person evaluation/go to the ED if the symptoms worsen or if the condition fails to improve as anticipated.  I provided 8 minutes of face-to-face time during this encounter. I also spent 10 minutes dedicated to the care of this patient including pre-visit review of records, post visit ordering of medications and appropriate tests or procedures, coordinating care and documenting this visit encounter.    Carter Quarry, MD [facility] [employer], [facility] "

## 2024-03-21 ENCOUNTER — Ambulatory Visit: Admitting: Internal Medicine

## 2024-03-21 ENCOUNTER — Encounter: Payer: Self-pay | Admitting: Internal Medicine

## 2024-03-21 ENCOUNTER — Emergency Department (HOSPITAL_BASED_OUTPATIENT_CLINIC_OR_DEPARTMENT_OTHER)
Admission: EM | Admit: 2024-03-21 | Discharge: 2024-03-21 | Disposition: A | Discharge status: Home / Self Care | Attending: Emergency Medicine | Admitting: Emergency Medicine

## 2024-03-21 ENCOUNTER — Other Ambulatory Visit: Payer: Self-pay

## 2024-03-21 VITALS — BP 130/88 | HR 103 | Temp 98.4°F | Wt 175.9 lb

## 2024-03-21 DIAGNOSIS — K219 Gastro-esophageal reflux disease without esophagitis: Secondary | ICD-10-CM

## 2024-03-21 DIAGNOSIS — R1013 Epigastric pain: Secondary | ICD-10-CM

## 2024-03-21 DIAGNOSIS — R1012 Left upper quadrant pain: Secondary | ICD-10-CM | POA: Insufficient documentation

## 2024-03-21 DIAGNOSIS — Z7982 Long term (current) use of aspirin: Secondary | ICD-10-CM | POA: Insufficient documentation

## 2024-03-21 DIAGNOSIS — F17210 Nicotine dependence, cigarettes, uncomplicated: Secondary | ICD-10-CM | POA: Insufficient documentation

## 2024-03-21 DIAGNOSIS — Z09 Encounter for follow-up examination after completed treatment for conditions other than malignant neoplasm: Secondary | ICD-10-CM

## 2024-03-21 DIAGNOSIS — R10A0 Flank pain, unspecified side: Secondary | ICD-10-CM

## 2024-03-21 DIAGNOSIS — R112 Nausea with vomiting, unspecified: Secondary | ICD-10-CM | POA: Insufficient documentation

## 2024-03-21 LAB — URINALYSIS, ROUTINE W REFLEX MICROSCOPIC
Bilirubin Urine: NEGATIVE
Glucose, UA: NEGATIVE mg/dL
Hgb urine dipstick: NEGATIVE
Ketones, ur: NEGATIVE mg/dL
Nitrite: NEGATIVE
Protein, ur: NEGATIVE mg/dL
Specific Gravity, Urine: 1.017 (ref 1.005–1.030)
pH: 6.5 (ref 5.0–8.0)

## 2024-03-21 LAB — POC URINALSYSI DIPSTICK (AUTOMATED)
Blood, UA: NEGATIVE
Glucose, UA: NEGATIVE
Leukocytes, UA: NEGATIVE
Nitrite, UA: NEGATIVE
Protein, UA: POSITIVE — AB
Spec Grav, UA: 1.015
Urobilinogen, UA: 0.2 U/dL
pH, UA: 7

## 2024-03-21 LAB — CBC
HCT: 39.9 % (ref 36.0–46.0)
Hemoglobin: 13.3 g/dL (ref 12.0–15.0)
MCH: 30.2 pg (ref 26.0–34.0)
MCHC: 33.3 g/dL (ref 30.0–36.0)
MCV: 90.7 fL (ref 80.0–100.0)
Platelets: 380 10*3/uL (ref 150–400)
RBC: 4.4 MIL/uL (ref 3.87–5.11)
RDW: 12.8 % (ref 11.5–15.5)
WBC: 8.3 10*3/uL (ref 4.0–10.5)
nRBC: 0 % (ref 0.0–0.2)

## 2024-03-21 LAB — COMPREHENSIVE METABOLIC PANEL WITH GFR
ALT: 11 U/L (ref 0–44)
AST: 28 U/L (ref 15–41)
Albumin: 4.2 g/dL (ref 3.5–5.0)
Alkaline Phosphatase: 72 U/L (ref 38–126)
Anion gap: 13 (ref 5–15)
BUN: 11 mg/dL (ref 6–20)
CO2: 25 mmol/L (ref 22–32)
Calcium: 10.2 mg/dL (ref 8.9–10.3)
Chloride: 103 mmol/L (ref 98–111)
Creatinine, Ser: 0.94 mg/dL (ref 0.44–1.00)
GFR, Estimated: >60 mL/min
Glucose, Bld: 118 mg/dL — ABNORMAL HIGH (ref 70–99)
Potassium: 4.1 mmol/L (ref 3.5–5.1)
Sodium: 142 mmol/L (ref 135–145)
Total Bilirubin: 0.5 mg/dL (ref 0.0–1.2)
Total Protein: 8.1 g/dL (ref 6.5–8.1)

## 2024-03-21 LAB — PREGNANCY, URINE: Preg Test, Ur: NEGATIVE

## 2024-03-21 LAB — LIPASE, BLOOD: Lipase: 45 U/L (ref 11–51)

## 2024-03-21 MED ORDER — HYOSCYAMINE SULFATE 0.125 MG SL SUBL
0.2500 mg | SUBLINGUAL_TABLET | Freq: Once | SUBLINGUAL | Status: AC
  Administered 2024-03-21: 0.25 mg via SUBLINGUAL
  Filled 2024-03-21: qty 2

## 2024-03-21 MED ORDER — ALUM & MAG HYDROXIDE-SIMETH 200-200-20 MG/5ML PO SUSP
30.0000 mL | Freq: Once | ORAL | Status: AC
  Administered 2024-03-21: 30 mL via ORAL
  Filled 2024-03-21: qty 30

## 2024-03-21 MED ORDER — PANTOPRAZOLE SODIUM 20 MG PO TBEC: 20.0000 mg | DELAYED_RELEASE_TABLET | Freq: Every day | ORAL | 0 refills | Status: DC

## 2024-03-21 MED ORDER — LIDOCAINE VISCOUS HCL 2 % MT SOLN
15.0000 mL | Freq: Once | OROMUCOSAL | Status: AC
  Administered 2024-03-21: 15 mL via ORAL
  Filled 2024-03-21: qty 15

## 2024-03-21 MED ORDER — PANTOPRAZOLE SODIUM 40 MG PO TBEC: 40.0000 mg | DELAYED_RELEASE_TABLET | Freq: Every day | ORAL | 1 refills | Status: AC

## 2024-03-21 MED ORDER — KETOROLAC TROMETHAMINE 15 MG/ML IJ SOLN
15.0000 mg | Freq: Once | INTRAMUSCULAR | Status: AC
  Administered 2024-03-21: 15 mg via INTRAVENOUS
  Filled 2024-03-21: qty 1

## 2024-03-21 MED ORDER — HYOSCYAMINE SULFATE SL 0.125 MG SL SUBL: 1.0000 | SUBLINGUAL_TABLET | Freq: Four times a day (QID) | SUBLINGUAL | 0 refills | Status: AC | PRN

## 2024-03-21 MED ORDER — ONDANSETRON 4 MG PO TBDP: 4.0000 mg | ORAL_TABLET | Freq: Three times a day (TID) | ORAL | 0 refills | Status: AC | PRN

## 2024-03-21 MED ORDER — ONDANSETRON HCL 4 MG/2ML IJ SOLN
4.0000 mg | Freq: Once | INTRAMUSCULAR | Status: AC
  Administered 2024-03-21: 4 mg via INTRAVENOUS
  Filled 2024-03-21: qty 2

## 2024-03-21 MED ORDER — SODIUM CHLORIDE 0.9 % IV BOLUS
1000.0000 mL | Freq: Once | INTRAVENOUS | Status: AC
  Administered 2024-03-21: 1000 mL via INTRAVENOUS

## 2024-03-21 NOTE — ED Notes (Signed)
 Pt ambulatory to restroom without assistance. Gait steady, personal footwear in place. Decrease in pain to 3/10 after medication administration, see MAR.

## 2024-03-21 NOTE — ED Triage Notes (Signed)
 POV epigastric pain that started at 2:10am. +N/+V/-D Reports streaks of blood in vomit. Reports SOB

## 2024-03-21 NOTE — ED Notes (Signed)
 Beverage given for PO challenge.

## 2024-03-21 NOTE — Progress Notes (Signed)
 "    Established Patient Office Visit     CC/Reason for Visit: ED follow-up  HPI: Kathleen Powell is a 39 y.o. female who is coming in today for the above mentioned reasons.  She was seen in the emergency department today with severe epigastric and left upper quadrant abdominal pain.  She has had 3-4 distinct episodes of this but last night was the worst 1.  Last night she had a tomato sauce heavy dinner.  Shortly thereafter started developing severe epigastric pain, nausea and emesis.  She has had 2 prior episodes of this.  She had a normal urine analysis and normal cardiac workup.   Past Medical/Surgical History: Past Medical History:  Diagnosis Date   AVN (avascular necrosis of bone) (HCC)    Migraine    Migraine    No pertinent past medical history    Tobacco abuse    Vertigo     Past Surgical History:  Procedure Laterality Date   CESAREAN SECTION  11/27/2011   Procedure: CESAREAN SECTION;  Surgeon: Aida DELENA Na, MD;  Location: [facility];  Service: Obstetrics;  Laterality: N/A;  Primary cesarean section with delivery of baby boy at 45. Apgars 8/9.   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 Her smoking use included cigarettes. She started smoking about 2 years ago. She has a 1 pack-year smoking history. She has quit using smokeless tobacco. She reports current alcohol use. She reports that she does not use drugs.  Allergies: Allergies[1]  Family History:  Family History  Problem Relation Age of Onset   Hypertension Mother    Cancer Mother        thyroid    Cataracts Mother    Heart disease Father        pace maker   Kidney failure Father    Asthma Sister    Heart disease Maternal Grandmother        congestive heart failure   Hypertension Maternal Grandmother    Heart attack Maternal Grandmother    Gout Maternal Grandfather    Colon cancer Paternal Grandfather    Colon cancer Paternal Uncle     Anesthesia problems Neg Hx     Current Medications[2]  Review of Systems:  Negative unless indicated in HPI.   Physical Exam: Vitals:   03/21/24 1501  BP: 130/88  Pulse: (!) 103  Temp: 98.4 F (36.9 C)  TempSrc: Oral  SpO2: 97%  Weight: 175 lb 14.4 oz (79.8 kg)    Body mass index is 31.16 kg/m.   Physical Exam Vitals reviewed.  Constitutional:      Appearance: Normal appearance.  HENT:     Head: Normocephalic and atraumatic.  Eyes:     Conjunctiva/sclera: Conjunctivae normal.  Cardiovascular:     Rate and Rhythm: Normal rate and regular rhythm.  Abdominal:     General: Bowel sounds are normal. There is no distension.     Tenderness: There is abdominal tenderness in the epigastric area.  Skin:    General: Skin is warm and dry.  Neurological:     General: No focal deficit present.     Mental Status: She is alert and oriented to person, place, and time.  Psychiatric:        Mood and Affect: Mood normal.        Behavior: Behavior normal.        Thought Content: Thought content normal.  Judgment: Judgment normal.      Impression and Plan:  Hospital discharge follow-up  Epigastric abdominal pain -     POCT Urinalysis Dipstick (Automated)  Flank pain, unspecified laterality -     POCT Urinalysis Dipstick (Automated)  Gastroesophageal reflux disease, unspecified whether esophagitis present -     Pantoprazole  Sodium; Take 1 tablet (40 mg total) by mouth daily.  Dispense: 90 tablet; Refill: 1  -Hospital charts reviewed in detail. -In office urine dipstick is negative. - Suspect symptoms related to acid reflux, will send pantoprazole  40 mg daily.  If no improvement consider GI referral for upper endoscopy.   Time spent: 33 minutes reviewing chart, interviewing and examining patient and formulating plan of care.     Tully Theophilus Andrews, MD [facility] at [facility]     [1] No Known Allergies [2]  Current Outpatient  Medications:    Hyoscyamine  Sulfate SL (LEVSIN /SL) 0.125 MG SUBL, Place 1 tablet (0.125 mg total) under the tongue 4 (four) times daily as needed., Disp: 30 tablet, Rfl: 0   ibuprofen  (ADVIL ) 800 MG tablet, Take 1 tablet (800 mg total) by mouth every 8 (eight) hours as needed., Disp: 30 tablet, Rfl: 5   Norethindrone Acetate-Ethinyl Estrad-FE (HAILEY  24 FE) 1-20 MG-MCG(24) tablet, Take 1 tablet by mouth daily., Disp: 84 tablet, Rfl: 3   ondansetron  (ZOFRAN -ODT) 4 MG disintegrating tablet, Take 1 tablet (4 mg total) by mouth every 8 (eight) hours as needed for up to 3 days for nausea or vomiting., Disp: 15 tablet, Rfl: 0   pantoprazole  (PROTONIX ) 40 MG tablet, Take 1 tablet (40 mg total) by mouth daily., Disp: 90 tablet, Rfl: 1  "

## 2024-03-21 NOTE — ED Provider Notes (Signed)
 " [facility]PART[facility] AT [facility] Provider Note  CSN: [id number] Arrival date & time: 03/21/24 0354  Chief Complaint(s) Abdominal Pain (Epigastric pain)  History provided by patient. HPI & MDM Kathleen Powell is a 39 y.o. female here for.   Abdominal Pain  Epigastric Ongoing for 3 to 4 hours with 1 bout of emesis containing stomach contents and streaky blood. Constant but fluctuating in intensity. No chest pain or shortness of breath No diarrhea Urinary symptoms No suspicious food intake     Medical Decision Making Amount and/or Complexity of Data Reviewed Labs: ordered. Decision-making details documented in ED Course.  Risk OTC drugs. Prescription drug management.    The patient's presentation involves an extensive number of treatment options, and the complaint(s) carry with it a high risk of complications and morbidity. The differential diagnosis includes but not limited to those listed below:  Upper abdominal pain CBC without leukocytosis or anemia. Metabolic panel without significant electrolyte derangement or renal sufficiency.  No evidence of bili obstruction or pancreatitis. UA without evidence of infection UPT negative ruling out pregnancy related process Suspicion for serious intra-abdominal inflammatory/infectious processes requiring CT scan at this time is low No clinical suspicion for primary cardiac/pulmonary etiology. Patient was provided with IV fluid, antiemetics, Toradol  and GI cocktail. Pain improved Tolerated PO  Final Clinical Impression(s) / ED Diagnoses Final diagnoses:  Epigastric abdominal pain  Nausea and vomiting in adult   The patient appears reasonably screened and/or stabilized for discharge and I doubt any other medical condition or other [facility] requiring further screening, evaluation, or treatment in the ED at this time. I have discussed the findings, Dx and Tx plan with the patient/family who expressed understanding  and agree(s) with the plan. Discharge instructions discussed at length. The patient/family was given strict return precautions who verbalized understanding of the instructions. No further questions at time of discharge.  Disposition: Discharge  Condition: Good  ED Discharge Orders          Ordered    ondansetron  (ZOFRAN -ODT) 4 MG disintegrating tablet  Every 8 hours PRN        03/21/24 0605    Hyoscyamine  Sulfate SL (LEVSIN /SL) 0.125 MG SUBL  4 times daily PRN        03/21/24 0605    pantoprazole  (PROTONIX ) 20 MG tablet  Daily        03/21/24 9385              Follow Up: Theophilus Andrews, Tully GRADE, MD [address] [place] [address] [phone]  Call  to schedule an appointment for close follow up     Past Medical History Past Medical History:  Diagnosis Date   AVN (avascular necrosis of bone) (HCC)    Migraine    Migraine    No pertinent past medical history    Tobacco abuse    Vertigo    Patient Active Problem List   Diagnosis Date Noted   Avascular necrosis of bone of left hip (HCC) 12/30/2022   Avascular necrosis of bone of hip, right (HCC) 12/30/2022   Vitamin D  deficiency 10/10/2020   Vitamin B12 deficiency 10/10/2020   Tobacco abuse    Continuous dependence on cigarette smoking 11/01/2018   Chronic migraine without aura without status migrainosus, not intractable 12/29/2017   Migraine with aura and without status migrainosus, not intractable 12/29/2017   Home Medication(s) Prior to Admission medications  Medication Sig Start Date End Date Taking? Authorizing Provider  Hyoscyamine  Sulfate SL (LEVSIN AMIEL)  0.125 MG SUBL Place 1 tablet (0.125 mg total) under the tongue 4 (four) times daily as needed. 03/21/24 03/26/24 Yes Reuben Knoblock, Raynell Moder, MD  ondansetron  (ZOFRAN -ODT) 4 MG disintegrating tablet Take 1 tablet (4 mg total) by mouth every 8 (eight) hours as needed for up to 3 days for nausea or vomiting. 03/21/24 03/24/24 Yes Muhsin Doris, Raynell Moder, MD  pantoprazole  (PROTONIX ) 20 MG tablet Take 1 tablet (20 mg total) by mouth daily. 03/21/24  Yes Brenee Gajda, Raynell Moder, MD  alendronate  (FOSAMAX ) 70 MG tablet Take 1 tablet (70 mg total) by mouth every 7 (seven) days. Patient not taking: Reported on 09/09/2023 12/30/22   Joane Artist RAMAN, MD  aspirin  EC 325 MG tablet Take 1 tablet (325 mg total) by mouth daily. Patient not taking: Reported on 09/09/2023 01/19/23   Genelle Standing, MD  cyanocobalamin  (VITAMIN B12) 1000 MCG/ML injection Inject 1 ml into the muscle once a week for a month.  Then injected 1 ml once a month thereafter Patient not taking: Reported on 09/09/2023 10/14/22   Theophilus Andrews, Tully GRADE, MD  ibuprofen  (ADVIL ) 800 MG tablet Take 1 tablet (800 mg total) by mouth every 8 (eight) hours as needed. 01/16/24   Rudy Carlin LABOR, MD  meloxicam  (MOBIC ) 7.5 MG tablet Take 1 tablet (7.5 mg total) by mouth daily. Patient not taking: Reported on 09/09/2023 11/04/22   Theophilus Andrews, Tully GRADE, MD  Norethindrone Acetate-Ethinyl Estrad-FE (HAILEY  24 FE) 1-20 MG-MCG(24) tablet Take 1 tablet by mouth daily. 02/15/24   Rudy Carlin LABOR, MD  omeprazole  (PRILOSEC) 40 MG capsule Take 1 capsule (40 mg total) by mouth daily. Patient not taking: Reported on 09/09/2023 06/13/23 08/12/23  Jordan, Betty G, MD  SYRINGE-NEEDLE, DISP, 3 ML (BD SAFETYGLIDE SYRINGE/NEEDLE) 25G X 1 3 ML MISC Use for B12 injections 10/14/22   Theophilus Andrews, Tully GRADE, MD                                                                                                                                    Allergies Patient has no known allergies.  Review of Systems Review of Systems  Gastrointestinal:  Positive for abdominal pain.   As noted in HPI  Physical Exam Vital Signs  I have reviewed the triage vital signs BP (!) 152/96 (BP Location: Right Arm)   Pulse (!) 105   Temp 97.7 F (36.5 C) (Oral)   Resp 18   LMP 03/16/2024   SpO2 100%   Physical Exam Vitals  reviewed.  Constitutional:      General: She is not in acute distress.    Appearance: She is well-developed. She is not diaphoretic.  HENT:     Head: Normocephalic and atraumatic.     Right Ear: External ear normal.     Left Ear: External ear normal.     Nose: Nose normal.  Eyes:     General: No scleral icterus.  Conjunctiva/sclera: Conjunctivae normal.  Neck:     Trachea: Phonation normal.  Cardiovascular:     Rate and Rhythm: Normal rate and regular rhythm.  Pulmonary:     Effort: Pulmonary effort is normal. No respiratory distress.     Breath sounds: No stridor.  Abdominal:     General: There is no distension.     Tenderness: There is abdominal tenderness (mild) in the epigastric area and left upper quadrant.  Musculoskeletal:        General: Normal range of motion.     Cervical back: Normal range of motion.  Neurological:     Mental Status: She is alert and oriented to person, place, and time.  Psychiatric:        Behavior: Behavior normal.     ED Results and Treatments Labs (all labs ordered are listed, but only abnormal results are displayed) Labs Reviewed  COMPREHENSIVE METABOLIC PANEL WITH GFR - Abnormal; Notable for the following components:      Result Value   Glucose, Bld 118 (*)    All other components within normal limits  URINALYSIS, ROUTINE W REFLEX MICROSCOPIC - Abnormal; Notable for the following components:   Leukocytes,Ua TRACE (*)    Bacteria, UA RARE (*)    All other components within normal limits  LIPASE, BLOOD  CBC  PREGNANCY, URINE                                                                                                                         EKG  EKG Interpretation Date/Time:    Ventricular Rate:    PR Interval:    QRS Duration:    QT Interval:    QTC Calculation:   R Axis:      Text Interpretation:         Radiology No results found.  Medications Ordered in ED Medications  sodium chloride  0.9 % bolus 1,000 mL (0  mLs Intravenous Stopped 03/21/24 0531)  ondansetron  (ZOFRAN ) injection 4 mg (4 mg Intravenous Given 03/21/24 0424)  alum & mag hydroxide-simeth (MAALOX/MYLANTA) 200-200-20 MG/5ML suspension 30 mL (30 mLs Oral Given 03/21/24 0508)    And  lidocaine  (XYLOCAINE ) 2 % viscous mouth solution 15 mL (15 mLs Oral Given 03/21/24 0508)  ketorolac  (TORADOL ) 15 MG/ML injection 15 mg (15 mg Intravenous Given 03/21/24 0539)  hyoscyamine  (LEVSIN  SL) SL tablet 0.25 mg (0.25 mg Sublingual Given 03/21/24 0606)   Procedures Procedures  (including critical care time)   This chart was dictated using voice recognition software.  Despite best efforts to proofread,  errors can occur which can change the documentation meaning.   Trine Raynell Moder, MD 03/21/24 [phone]  "
